# Patient Record
Sex: Male | Born: 1980 | Race: White | Hispanic: No | Marital: Single | Smoking: Current every day smoker
Health system: Southern US, Community
[De-identification: ages and names within clinical notes are randomized; demographics above are authoritative.]

## PROBLEM LIST (undated history)

## (undated) DIAGNOSIS — Z8709 Personal history of other diseases of the respiratory system: Secondary | ICD-10-CM

## (undated) DIAGNOSIS — Z8489 Family history of other specified conditions: Secondary | ICD-10-CM

## (undated) DIAGNOSIS — Z973 Presence of spectacles and contact lenses: Secondary | ICD-10-CM

## (undated) DIAGNOSIS — G63 Polyneuropathy in diseases classified elsewhere: Secondary | ICD-10-CM

## (undated) DIAGNOSIS — F329 Major depressive disorder, single episode, unspecified: Secondary | ICD-10-CM

## (undated) DIAGNOSIS — F410 Panic disorder [episodic paroxysmal anxiety] without agoraphobia: Secondary | ICD-10-CM

## (undated) DIAGNOSIS — E119 Type 2 diabetes mellitus without complications: Secondary | ICD-10-CM

## (undated) DIAGNOSIS — I739 Peripheral vascular disease, unspecified: Secondary | ICD-10-CM

## (undated) DIAGNOSIS — E349 Endocrine disorder, unspecified: Secondary | ICD-10-CM

## (undated) DIAGNOSIS — R351 Nocturia: Secondary | ICD-10-CM

## (undated) DIAGNOSIS — L24A9 Irritant contact dermatitis due friction or contact with other specified body fluids: Secondary | ICD-10-CM

## (undated) DIAGNOSIS — T148XXA Other injury of unspecified body region, initial encounter: Secondary | ICD-10-CM

## (undated) DIAGNOSIS — I10 Essential (primary) hypertension: Secondary | ICD-10-CM

## (undated) DIAGNOSIS — K219 Gastro-esophageal reflux disease without esophagitis: Secondary | ICD-10-CM

## (undated) DIAGNOSIS — D649 Anemia, unspecified: Secondary | ICD-10-CM

## (undated) DIAGNOSIS — Z972 Presence of dental prosthetic device (complete) (partial): Secondary | ICD-10-CM

## (undated) DIAGNOSIS — F32A Depression, unspecified: Secondary | ICD-10-CM

## (undated) DIAGNOSIS — K08109 Complete loss of teeth, unspecified cause, unspecified class: Secondary | ICD-10-CM

## (undated) HISTORY — PX: MULTIPLE TOOTH EXTRACTIONS: SHX2053

## (undated) HISTORY — DX: Peripheral vascular disease, unspecified: I73.9

## (undated) HISTORY — PX: ESOPHAGOGASTRODUODENOSCOPY: SHX1529

## (undated) HISTORY — PX: MOUTH SURGERY: SHX715

---
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
## 1999-07-06 ENCOUNTER — Encounter (HOSPITAL_COMMUNITY): Admission: RE | Admit: 1999-07-06 | Discharge: 1999-10-04 | Payer: Self-pay | Admitting: Dentistry

## 2000-08-08 ENCOUNTER — Emergency Department (HOSPITAL_COMMUNITY): Admission: EM | Admit: 2000-08-08 | Discharge: 2000-08-08 | Payer: Self-pay | Admitting: Emergency Medicine

## 2002-12-11 ENCOUNTER — Encounter: Admission: RE | Admit: 2002-12-11 | Discharge: 2002-12-11 | Payer: Self-pay | Admitting: Dentistry

## 2003-10-13 ENCOUNTER — Inpatient Hospital Stay (HOSPITAL_COMMUNITY): Admission: EM | Admit: 2003-10-13 | Discharge: 2003-10-16 | Payer: Self-pay | Admitting: Emergency Medicine

## 2003-10-15 DIAGNOSIS — K0401 Reversible pulpitis: Secondary | ICD-10-CM

## 2003-10-15 DIAGNOSIS — K045 Chronic apical periodontitis: Secondary | ICD-10-CM

## 2003-10-20 ENCOUNTER — Emergency Department (HOSPITAL_COMMUNITY): Admission: EM | Admit: 2003-10-20 | Discharge: 2003-10-21 | Payer: Self-pay | Admitting: Emergency Medicine

## 2003-12-06 ENCOUNTER — Ambulatory Visit (HOSPITAL_COMMUNITY): Admission: RE | Admit: 2003-12-06 | Discharge: 2003-12-06 | Payer: Self-pay | Admitting: Internal Medicine

## 2003-12-12 ENCOUNTER — Encounter: Payer: Self-pay | Admitting: Internal Medicine

## 2004-08-31 ENCOUNTER — Ambulatory Visit: Payer: Self-pay | Admitting: Psychology

## 2004-09-02 ENCOUNTER — Ambulatory Visit: Payer: Self-pay | Admitting: Internal Medicine

## 2005-08-27 ENCOUNTER — Ambulatory Visit: Payer: Self-pay | Admitting: Endocrinology

## 2006-01-11 ENCOUNTER — Ambulatory Visit: Payer: Self-pay | Admitting: Internal Medicine

## 2006-01-14 ENCOUNTER — Ambulatory Visit: Payer: Self-pay | Admitting: Internal Medicine

## 2006-03-28 ENCOUNTER — Ambulatory Visit: Payer: Self-pay | Admitting: Dentistry

## 2006-05-10 ENCOUNTER — Ambulatory Visit: Payer: Self-pay | Admitting: Dentistry

## 2006-06-02 ENCOUNTER — Ambulatory Visit: Payer: Self-pay | Admitting: Endocrinology

## 2006-06-07 ENCOUNTER — Ambulatory Visit: Payer: Self-pay

## 2006-06-09 ENCOUNTER — Ambulatory Visit (HOSPITAL_COMMUNITY): Admission: RE | Admit: 2006-06-09 | Discharge: 2006-06-09 | Payer: Self-pay | Admitting: Dentistry

## 2006-06-17 ENCOUNTER — Encounter: Payer: Self-pay | Admitting: *Deleted

## 2006-06-18 ENCOUNTER — Inpatient Hospital Stay (HOSPITAL_COMMUNITY): Admission: EM | Admit: 2006-06-18 | Discharge: 2006-06-21 | Payer: Self-pay | Admitting: Emergency Medicine

## 2006-06-18 ENCOUNTER — Ambulatory Visit: Payer: Self-pay | Admitting: Internal Medicine

## 2006-06-28 ENCOUNTER — Ambulatory Visit: Payer: Self-pay | Admitting: Endocrinology

## 2006-07-12 ENCOUNTER — Ambulatory Visit: Payer: Self-pay | Admitting: Internal Medicine

## 2006-07-20 ENCOUNTER — Ambulatory Visit: Payer: Self-pay | Admitting: Endocrinology

## 2006-08-06 ENCOUNTER — Ambulatory Visit: Payer: Self-pay | Admitting: Family Medicine

## 2006-09-14 ENCOUNTER — Ambulatory Visit: Payer: Self-pay | Admitting: Endocrinology

## 2006-09-14 LAB — CONVERTED CEMR LAB: Hgb A1c MFr Bld: 9.5 % — ABNORMAL HIGH (ref 4.6–6.0)

## 2006-09-20 ENCOUNTER — Ambulatory Visit: Payer: Self-pay | Admitting: Endocrinology

## 2006-09-29 ENCOUNTER — Encounter: Payer: Self-pay | Admitting: Endocrinology

## 2006-09-29 DIAGNOSIS — K219 Gastro-esophageal reflux disease without esophagitis: Secondary | ICD-10-CM

## 2006-09-29 DIAGNOSIS — I119 Hypertensive heart disease without heart failure: Secondary | ICD-10-CM

## 2006-09-29 DIAGNOSIS — F411 Generalized anxiety disorder: Secondary | ICD-10-CM | POA: Insufficient documentation

## 2006-11-11 ENCOUNTER — Ambulatory Visit: Payer: Self-pay | Admitting: Endocrinology

## 2006-11-21 ENCOUNTER — Emergency Department (HOSPITAL_COMMUNITY): Admission: EM | Admit: 2006-11-21 | Discharge: 2006-11-21 | Payer: Self-pay | Admitting: Emergency Medicine

## 2006-12-07 ENCOUNTER — Ambulatory Visit: Payer: Self-pay | Admitting: Internal Medicine

## 2006-12-08 ENCOUNTER — Encounter: Payer: Self-pay | Admitting: Internal Medicine

## 2007-01-16 ENCOUNTER — Encounter: Payer: Self-pay | Admitting: Endocrinology

## 2007-01-16 ENCOUNTER — Telehealth: Payer: Self-pay | Admitting: Endocrinology

## 2007-02-01 ENCOUNTER — Ambulatory Visit: Payer: Self-pay | Admitting: Internal Medicine

## 2007-02-01 DIAGNOSIS — F519 Sleep disorder not due to a substance or known physiological condition, unspecified: Secondary | ICD-10-CM | POA: Insufficient documentation

## 2007-02-01 DIAGNOSIS — E109 Type 1 diabetes mellitus without complications: Secondary | ICD-10-CM | POA: Insufficient documentation

## 2007-02-01 DIAGNOSIS — J069 Acute upper respiratory infection, unspecified: Secondary | ICD-10-CM | POA: Insufficient documentation

## 2007-02-02 ENCOUNTER — Encounter: Payer: Self-pay | Admitting: Internal Medicine

## 2007-02-02 LAB — CONVERTED CEMR LAB
AST: 65 units/L — ABNORMAL HIGH (ref 0–37)
Bilirubin, Direct: 0.2 mg/dL (ref 0.0–0.3)
CO2: 34 meq/L — ABNORMAL HIGH (ref 19–32)
Chloride: 97 meq/L (ref 96–112)
Creatinine, Ser: 0.8 mg/dL (ref 0.4–1.5)
Creatinine,U: 87.5 mg/dL
Eosinophils Absolute: 0.1 10*3/uL (ref 0.0–0.6)
Eosinophils Relative: 0.7 % (ref 0.0–5.0)
GFR calc non Af Amer: 123 mL/min
Glucose, Bld: 248 mg/dL — ABNORMAL HIGH (ref 70–99)
HCT: 46.3 % (ref 39.0–52.0)
HCV Ab: NEGATIVE
Hep A IgM: NEGATIVE
Hep B C IgM: NEGATIVE
Ketones, ur: NEGATIVE mg/dL
MCV: 97.2 fL (ref 78.0–100.0)
Neutrophils Relative %: 68.6 % (ref 43.0–77.0)
Nitrite: NEGATIVE
RBC: 4.76 M/uL (ref 4.22–5.81)
Sodium: 138 meq/L (ref 135–145)
Total Bilirubin: 0.7 mg/dL (ref 0.3–1.2)
Total Protein: 6.9 g/dL (ref 6.0–8.3)
Urobilinogen, UA: 1 (ref 0.0–1.0)
WBC: 7.5 10*3/uL (ref 4.5–10.5)

## 2007-04-04 ENCOUNTER — Ambulatory Visit: Payer: Self-pay | Admitting: Endocrinology

## 2007-04-04 LAB — CONVERTED CEMR LAB: Hgb A1c MFr Bld: 9.5 % — ABNORMAL HIGH (ref 4.6–6.0)

## 2007-05-05 ENCOUNTER — Ambulatory Visit: Payer: Self-pay | Admitting: Endocrinology

## 2007-05-12 ENCOUNTER — Telehealth: Payer: Self-pay | Admitting: Endocrinology

## 2007-05-16 ENCOUNTER — Ambulatory Visit: Payer: Self-pay | Admitting: Internal Medicine

## 2007-06-20 ENCOUNTER — Ambulatory Visit: Payer: Self-pay | Admitting: Internal Medicine

## 2007-07-03 ENCOUNTER — Encounter: Payer: Self-pay | Admitting: Endocrinology

## 2007-07-04 ENCOUNTER — Encounter: Payer: Self-pay | Admitting: Endocrinology

## 2007-08-14 ENCOUNTER — Telehealth: Payer: Self-pay | Admitting: Endocrinology

## 2007-08-23 ENCOUNTER — Ambulatory Visit: Payer: Self-pay | Admitting: Endocrinology

## 2007-08-23 ENCOUNTER — Encounter: Payer: Self-pay | Admitting: Endocrinology

## 2007-08-23 DIAGNOSIS — R05 Cough: Secondary | ICD-10-CM

## 2007-08-23 DIAGNOSIS — R059 Cough, unspecified: Secondary | ICD-10-CM | POA: Insufficient documentation

## 2007-08-23 DIAGNOSIS — F17200 Nicotine dependence, unspecified, uncomplicated: Secondary | ICD-10-CM

## 2007-08-28 ENCOUNTER — Ambulatory Visit: Payer: Self-pay | Admitting: Endocrinology

## 2008-01-18 ENCOUNTER — Ambulatory Visit: Payer: Self-pay | Admitting: Internal Medicine

## 2008-01-18 ENCOUNTER — Observation Stay (HOSPITAL_COMMUNITY): Admission: EM | Admit: 2008-01-18 | Discharge: 2008-01-19 | Payer: Self-pay | Admitting: Emergency Medicine

## 2008-02-02 ENCOUNTER — Ambulatory Visit: Payer: Self-pay | Admitting: Endocrinology

## 2008-02-02 DIAGNOSIS — K769 Liver disease, unspecified: Secondary | ICD-10-CM | POA: Insufficient documentation

## 2008-02-02 DIAGNOSIS — D72829 Elevated white blood cell count, unspecified: Secondary | ICD-10-CM | POA: Insufficient documentation

## 2008-02-02 DIAGNOSIS — F1010 Alcohol abuse, uncomplicated: Secondary | ICD-10-CM | POA: Insufficient documentation

## 2008-02-02 LAB — CONVERTED CEMR LAB
AST: 19 units/L (ref 0–37)
Albumin: 4 g/dL (ref 3.5–5.2)
Alkaline Phosphatase: 83 units/L (ref 39–117)
BUN: 6 mg/dL (ref 6–23)
Bilirubin, Direct: 0.1 mg/dL (ref 0.0–0.3)
Chloride: 104 meq/L (ref 96–112)
Eosinophils Relative: 1.2 % (ref 0.0–5.0)
GFR calc non Af Amer: 143 mL/min
Glucose, Bld: 196 mg/dL — ABNORMAL HIGH (ref 70–99)
Monocytes Relative: 7.3 % (ref 3.0–12.0)
Neutrophils Relative %: 61.9 % (ref 43.0–77.0)
Platelets: 206 10*3/uL (ref 150–400)
Potassium: 4.1 meq/L (ref 3.5–5.1)
RDW: 12.8 % (ref 11.5–14.6)
Sodium: 141 meq/L (ref 135–145)
Total Protein: 6.5 g/dL (ref 6.0–8.3)
WBC: 6.5 10*3/uL (ref 4.5–10.5)

## 2008-02-12 ENCOUNTER — Telehealth (INDEPENDENT_AMBULATORY_CARE_PROVIDER_SITE_OTHER): Payer: Self-pay | Admitting: *Deleted

## 2008-03-13 ENCOUNTER — Telehealth (INDEPENDENT_AMBULATORY_CARE_PROVIDER_SITE_OTHER): Payer: Self-pay | Admitting: *Deleted

## 2008-04-16 ENCOUNTER — Ambulatory Visit: Payer: Self-pay | Admitting: Endocrinology

## 2008-06-14 ENCOUNTER — Ambulatory Visit: Payer: Self-pay | Admitting: Endocrinology

## 2009-04-15 ENCOUNTER — Ambulatory Visit: Payer: Self-pay | Admitting: Endocrinology

## 2009-04-15 LAB — CONVERTED CEMR LAB
ALT: 39 units/L (ref 0–53)
AST: 39 units/L — ABNORMAL HIGH (ref 0–37)
Albumin: 4.4 g/dL (ref 3.5–5.2)
Alkaline Phosphatase: 73 units/L (ref 39–117)
Basophils Absolute: 0 10*3/uL (ref 0.0–0.1)
Basophils Relative: 0.3 % (ref 0.0–3.0)
Calcium: 9.4 mg/dL (ref 8.4–10.5)
Creatinine,U: 65.9 mg/dL
Direct LDL: 77.5 mg/dL
Eosinophils Relative: 0.8 % (ref 0.0–5.0)
GFR calc non Af Amer: 140.74 mL/min (ref >60)
Glucose, Bld: 159 mg/dL — ABNORMAL HIGH (ref 70–99)
HCT: 45.3 % (ref 39.0–52.0)
HDL: 136.4 mg/dL (ref >39.00)
Hemoglobin: 15.2 g/dL (ref 13.0–17.0)
Ketones, ur: NEGATIVE mg/dL
Lymphocytes Relative: 18.5 % (ref 12.0–46.0)
Lymphs Abs: 1.5 10*3/uL (ref 0.7–4.0)
Monocytes Relative: 7.9 % (ref 3.0–12.0)
Neutro Abs: 6 10*3/uL (ref 1.4–7.7)
Potassium: 3.9 meq/L (ref 3.5–5.1)
RBC: 4.64 M/uL (ref 4.22–5.81)
RDW: 12.2 % (ref 11.5–14.6)
Sodium: 139 meq/L (ref 135–145)
Specific Gravity, Urine: 1.015 (ref 1.000–1.030)
TSH: 1.41 microintl units/mL (ref 0.35–5.50)
Total CHOL/HDL Ratio: 2
Total Protein: 6.8 g/dL (ref 6.0–8.3)
Triglycerides: 46 mg/dL (ref 0.0–149.0)
Urine Glucose: >=1000 mg/dL
Urobilinogen, UA: 1 (ref 0.0–1.0)
WBC: 8.2 10*3/uL (ref 4.5–10.5)
pH: 6 (ref 5.0–8.0)

## 2009-04-16 IMAGING — CR DG CHEST 2V
2 series · 2 of 2 positions shown · non-contrast
Comparison: Chest x-ray of 08/23/2007

CLINICAL DATA: Short of breath, cough, smoking history

CHEST - 2 VIEW

[view not recorded (1 of 2)]
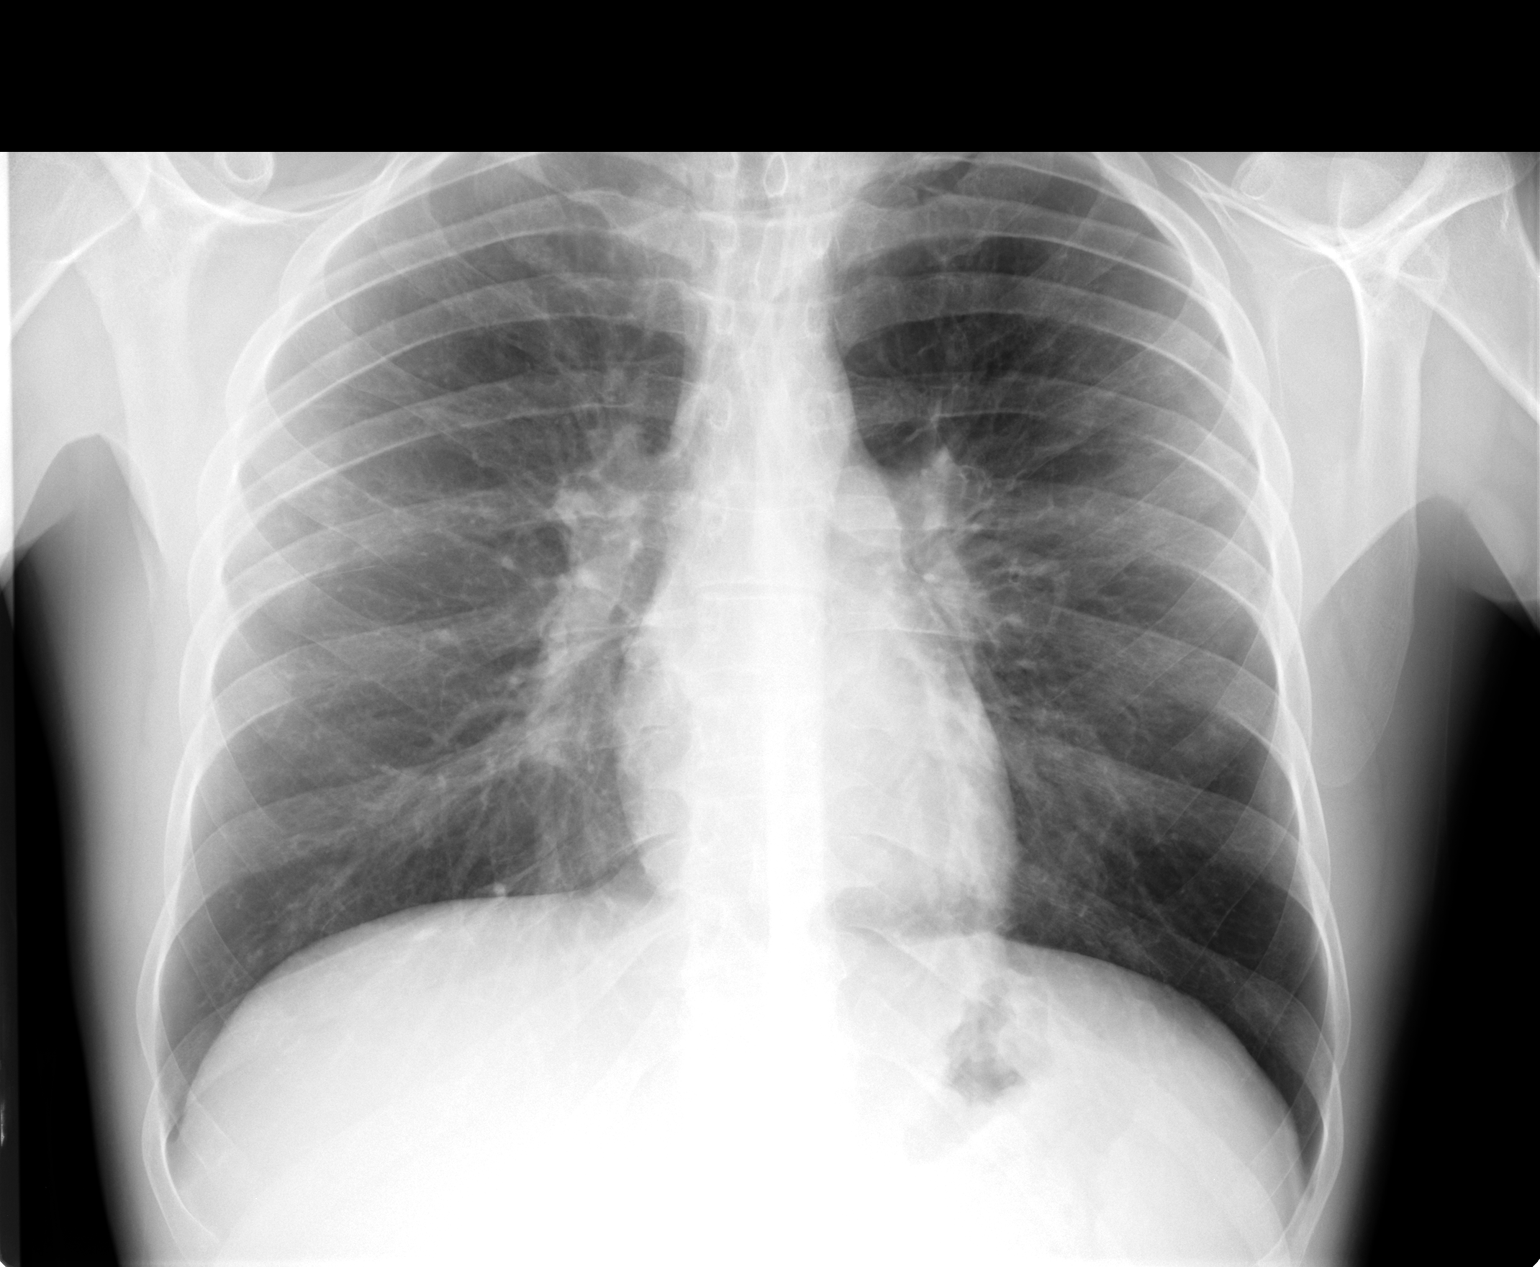

[view not recorded (2 of 2)]
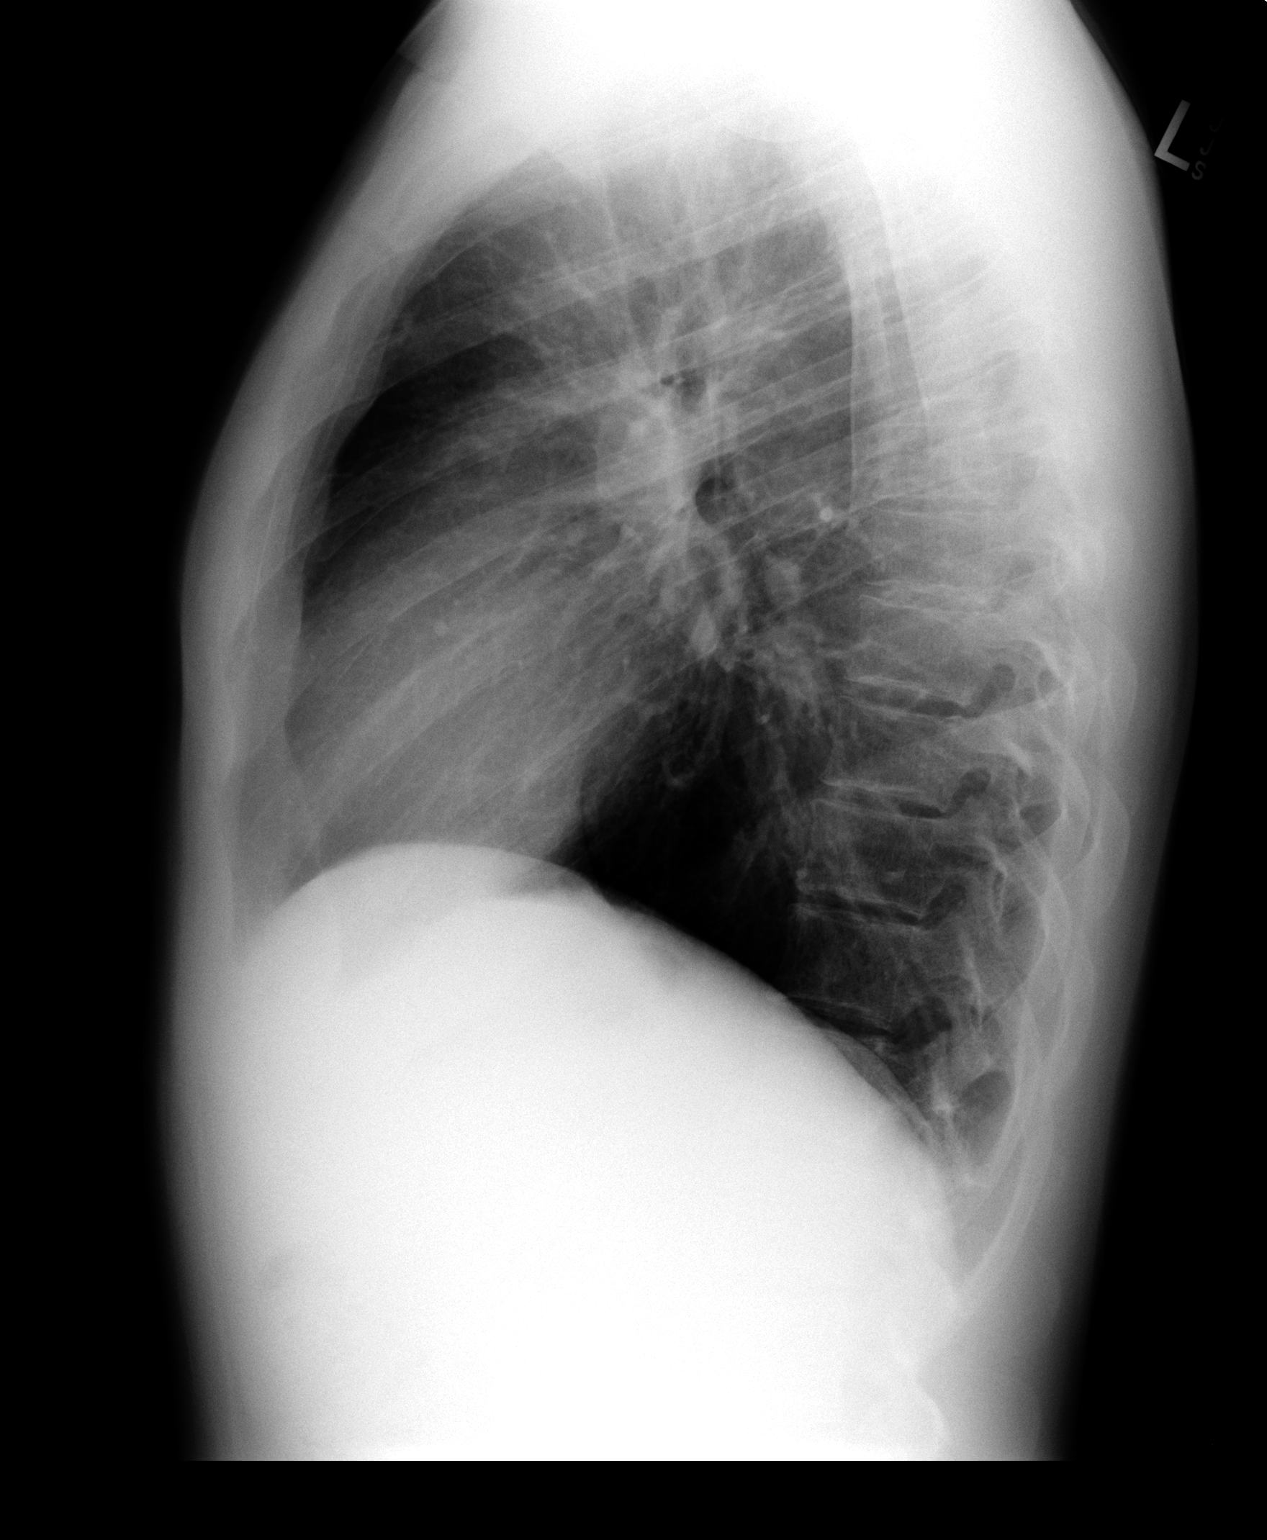

[2 of 2 positions shown; findings below may reference images not displayed]

FINDINGS: The lungs remain clear and slightly hyperaerated.  Heart
is within normal limits in size.  No bony abnormality is seen.
IMPRESSION: Stable chest x-ray with slight hyperaeration.  No active lung
disease.

## 2009-05-16 ENCOUNTER — Ambulatory Visit: Payer: Self-pay | Admitting: Endocrinology

## 2010-03-31 NOTE — Assessment & Plan Note (Signed)
Summary: FU Jay Barber  #   Vital Signs:  Patient profile:   30 year old male Height:      71 inches (180.34 cm) Weight:      148 pounds (67.27 kg) BMI:     20.72 O2 Sat:      97 % on Room air Temp:     98.4 degrees F (36.89 degrees C) oral Pulse rate:   77 / minute BP sitting:   120 / 70  (left arm) Cuff size:   regular  Vitals Entered By: Josph Macho RMA (April 15, 2009 2:25 PM)  O2 Flow:  Room air CC: Follow-up visit/ CF Is Patient Diabetic? Yes   CC:  Follow-up visit/ CF.  History of Present Illness: no cbg record, but states cbg's are low in the middle of the night, approx 1/week.  he says it is "good during the day."  he otherwise feels well. he says he wants to quit smoking. pt c/o skin peeling off his feet, especially the right foot.   Current Medications (verified): 1)  Accu-Chek Aviva  Strp (Glucose Blood) .... Check Blood Sugars Up To 8/day 2)  Bd U/f Iii Short Pen Needle 31g X 8 Mm Misc (Insulin Pen Needle) .... Use As Directed 3)  Zoloft 50 Mg Tabs (Sertraline Hcl) .... Take 1 By Mouth Qd 4)  Novolog Mix 70/30 Flexpen 70-30 % Susp (Insulin Aspart Prot & Aspart) .Marland Kitchen.. 16 Units Qam    9 Units Qpm 5)  Xanax .... 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    Physical Exam  General:  normal appearance.   Pulses:  dorsalis pedis intact bilat.  Extremities:  no deformity.  no ulcer on the feet.  feet are of normal color and temp.  no edema  Neurologic:  sensation is intact to  touch on the feet.  Additional Exam:  Hemoglobin A1C       [H]  9.6 %    Impression & Recommendations:  Problem # 1:  DIABETES MELLITUS, TYPE I (ICD-250.01) therapy limited by noncompliance.  i'll do the best i can.  Problem # 2:  SMOKER (ICD-305.1) wants to quit  Problem # 3:  tinea pedis recurrent  Medications Added to Medication List This Visit: 1)  Novolog Mix 70/30 Flexpen 70-30 % Susp (Insulin aspart prot & aspart) .Marland Kitchen.. 16 units qam    8 units qpm 2)  Chantix Starting Month Pak 0.5 Mg X 11 & 1 Mg X 42 Tabs (Varenicline tartrate) .... As directed.  please refill with continuing packs.  Other Orders: TLB-Lipid Panel (80061-LIPID) TLB-BMP (Basic Metabolic Panel-BMET) (80048-METABOL) TLB-CBC Platelet - w/Differential (85025-CBCD) TLB-Hepatic/Liver Function Pnl (80076-HEPATIC) TLB-TSH (Thyroid Stimulating Hormone) (84443-TSH) TLB-A1C / Hgb A1C (Glycohemoglobin) (83036-A1C) TLB-Microalbumin/Creat Ratio, Urine (82043-MALB) TLB-Udip w/ Micro (81001-URINE) Est. Patient Level IV (16109)  Patient Instructions: 1)  decrease novolog 70/30 to 16 units am and 8 units pm 2)  Please schedule a follow-up appointment in 1 month. 3)  check your blood glucose 4 times a day.  vary the time of day between before the 3 meals and at bedtime.  also check if you feel as though  your glucose might be very high or too low.  bring a record of this to your doctor appointments. 4)  i sent a precsription for "chantix" to your pharmacy.  joining a "quit smoking" program significanlty increases your chances of successfully quitting, and may be required for the insurance to pay for the medication.  a side effect of this medication is a possible worsening of depression.  call if this happens. 5)  apply clotrimazole cream (non-prescription) to the feet two times a day. 6)  tests are being ordered for you today.  a few days after the test(s), please call [phone] to hear your test results. 7)  (update: i left  message on phone-tree:  rx as we discussed) Prescriptions: ACCU-CHEK AVIVA  STRP (GLUCOSE BLOOD) CHECK BLOOD SUGARS UP TO 8/day  #250 x 11   Entered and Authorized by:   Minus Breeding MD   Signed by:   Minus Breeding MD on 04/15/2009   Method used:   Electronically to        [facility]  [address] [address] [phone][facility] (retail)       [address]       [place], [place]  [address]       Ph: [phone] or [phone]       Fax: [phone]   RxID:   [id number] BD U/F III SHORT PEN NEEDLE 31G X 8 MM MISC (INSULIN PEN NEEDLE) use as directed  #120 x 6   Entered and Authorized by:   Minus Breeding MD   Signed by:   Minus Breeding MD on 04/15/2009   Method used:   Electronically to        [facility]  [address] [phone][facility] (retail)       [address]       [place], [place]  [address]       Ph: [phone] or [phone]       Fax: [phone]   RxID:   [id number] NOVOLOG MIX 70/30 FLEXPEN 70-30 % SUSP (INSULIN ASPART PROT & ASPART) 16 units qam    8 units qpm  #1 box x 5   Entered and Authorized by:   Minus Breeding MD   Signed by:   Minus Breeding MD on 04/15/2009   Method used:   Electronically to        [facility]  [address] [phone][facility] (retail)       [address]       [place], [place]  [address]       Ph: [phone] or [phone]       Fax: [phone]   RxID:   [id number] CHANTIX STARTING MONTH PAK 0.5 MG X 11 & 1 MG X 42 TABS (VARENICLINE TARTRATE) as directed.  please refill with continuing packs.  #1 pack x 5   Entered and Authorized by:   Minus Breeding MD   Signed by:   Minus Breeding MD on 04/15/2009   Method used:   Electronically to        [facility]  [address] [phone]* (retail)       [address]       [place], [place]  [address]       Ph: [phone] or [phone]  Fax: [phone]   RxID:   [id number]

## 2010-03-31 NOTE — Letter (Signed)
Summary: Generic Letter  [facility]  [address] [place], [place] [address]   Phone: [phone]  Fax: [phone]    04/15/2009  Jay Barber [address] [place], [place]  [address]  Dear [name],  This is to certify that we are taking measures to prevent your blood sugar from going low.  However, this could still happen, causing you to miss work.     Sincerely,   Romero Belling MD

## 2010-07-14 NOTE — Assessment & Plan Note (Signed)
[facility]                         GASTROENTEROLOGY OFFICE NOTE   NAME:JOHNSONLondon, Tarnowski                      MRN:          [id number]  DATE:12/07/2006                            DOB:          Oct 31, 1980    CHIEF COMPLAINT:  Diarrhea, abdominal pain.   HISTORY:  This is a 30 year old white male I had seen in the past with  vomiting and mild erosive esophagitis.  He is now having 1-2 months of  postprandial watery diarrhea.  Associated with come crampy abdominal  pain.  No fever or chills or rash.  He has had no recent antibiotics.  No weight loss.  He is moving his bowel 10 minutes to an hour after he  eats.  There are no nocturnal symptoms.  He has been moving through some  different SSRIs recently as well.  Anxiety seems to be under better  control.  Respiratory and constitutional review of systems otherwise  under control.   PAST MEDICAL HISTORY:  1. Diabetes mellitus.  2. Anxiety.  3. Gastroesophageal reflux disease.  He has recently tried some      Prilosec because Nexium was expensive.  He is having some chest      pain problems which are fleeting, pins and needles pains in the      upper chest.  A stress test was negative he tells me and he took      Prilosec for a month and that did not seem to change that.  He is      now off a PPI.  4. Smoking.  5. Hypertension.  6. History of diabetic ketoacidosis.   MEDICATIONS:  1. Lantus.  2. NovoLog.  3. Zoloft 100 mg daily.  4. Xanax 0.25 mg p.r.n. and 1 mg p.r.n.   DRUG ALLERGIES:  NONE KNOWN.   REVIEW OF SYSTEMS:  As above.   Family history, social history reviewed and unchanged.  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   PHYSICAL:  Weight 153 pounds, pulse 68, blood pressure 178/72.  EYES:  Anicteric.  NECK:  Supple.  CHEST:  Clear.  HEART:  S1-S2, no rubs or gallops.  ABDOMEN:  Soft, nontender, no organomegaly or mass.  LOWER EXTREMITIES:  Free of edema.  SKIN:  Warm and  dry without acute rash in the upper trunk.  MOUTH:  Posterior pharynx free of lesions, he does wear dentures.  He is alert and oriented x3.  He does not appear overtly anxious today.   ASSESSMENT:  Diarrhea, unclear etiology.  Certainly could be a  functional syndrome.  Must rule out infection first.   PLAN:  1. Stool studies for fecal lactoferrin, culture, C. diff toxin and ova      and parasite screening.  2. In the interim 1-2 Imodium a day.  3. If the stool studies are all negative I think I would take a      approach toward a functional therapy.  He has noted some benefit      with Pepto Bismol.  Empiric antibiotics would be reasonable in a      diabetic  as well, something like Cipro I think would make sense for      a cost effectiveness, but Xifaxan could be used as well.  So, if      his stool studies are all negative I would take an approach like      that and then see the result and then see him back.  Will notify      him with the results.     Iva Boop, MD,FACG  Electronically Signed    CEG/MedQ  DD: 12/07/2006  DT: 12/08/2006  Job #: [id number]

## 2010-07-14 NOTE — Assessment & Plan Note (Signed)
[facility]                                 ON-CALL NOTE   Jay Barber, Jay Barber                        MRN:          [id number]  DATE:08/03/2006                            DOB:          [birth year]    Phone number:  [phone].  Patient of Dr. Everardo All.  He calls at about  7:16 p.m. on June 4th.   He calls because he does not feel well.  He is having cold sweats,  shaking and his blood pressure is high; he measured it with a machine at  170/108.  He said he feels like the kind of sensation you get with a low  sugar reaction and face flushing, but his sugars have been okay.  He has  had diabetes for 8 years, treated with Lantus and NovoLog.  He recently  went off Lexapro in the past week or so, but has not had any symptoms  until the last day or so.  He does not feel sick like with a fever.  He  is getting over asthmatic bronchitis, but feels like he is over that.  No breathing problems, abdominal problems or other infectious symptoms.   PLAN:  I told him this could be related to his blood pressure, but it is  also possible it is a withdrawal effect from the Lexapro.  I asked him  to call Dr. Everardo All for evaluation tomorrow if he is not feeling any  better.  If he was to have chest pain or significant shortness of breath  I asked him to call 911 so that he could emergency evaluation tonight.     Karie Schwalbe, MD  Electronically Signed    RIL/MedQ  DD: 08/03/2006  DT: 08/04/2006  Job #: [id number]   cc:   Gregary Signs A. Everardo All, MD

## 2010-07-14 NOTE — Discharge Summary (Signed)
Jay Barber, Jay Barber NO.:  [id number]   MEDICAL RECORD NO.:  [id number]          PATIENT TYPE:  INP   LOCATION:  1222                         FACILITY:  [facility]   PHYSICIAN:  Corwin Levins, MD      DATE OF BIRTH:  12-12-1980   DATE OF ADMISSION:  01/17/2008  DATE OF DISCHARGE:  01/19/2008                               DISCHARGE SUMMARY   PRIMARY CARE PHYSICIAN:  Dr. Romero Belling.   DISCHARGE DIAGNOSES:  1. Anion gap.  2. Diabetic ketoacidosis, now resolved.   HISTORY OF PRESENT ILLNESS:  Jay Barber is a 30 year old white male  with past medical history of type 1 diabetes who reported to [facility]  Emergency Room on day of admission with reports of feeling ill with  elevated blood sugar.  Patient reported nausea, vomiting, and abdominal  pain starting just prior to arrival in emergency room.  Upon initial  evaluation, patient found to have a bicarb of 7 with blood sugar 454.  Patient was admitted at that time for further evaluation and treatment.  Also of note, per admission H and P, patient denied any recent ETOH use.  However, upon further discussion with patient, he admits to drinking  approximately 8 beers on evening prior to this admission likely  initiating uncontrolled blood sugars.   PAST MEDICAL HISTORY:  1. Type 1 diabetes.  2. Current tobacco abuse.   COURSE OF HOSPITALIZATION:  Diabetic ketoacidosis.  Patient admitted to  the emergency room to the intensive care unit, placed on IV insulin drip  with close monitoring of BMETs.  Patient responded very well to IV  fluids and IV insulin drip with bicarb up to 23 at time of dictation.  Again, patient's onset of DKA likely secondary to ETOH use prior to this  admission.  Patient reports compliance with medications.  Counseled  patient at length in regard to adverse effects of drinking ETOH in  setting of type 1 diabetes.  Patient verified understanding.  A1c not  obtained during this admission,  however, would recommend rechecking at  time of followup appointment.  Last A1c documented at 9.5 in February  2009.  Patient without any signs or symptoms of infection with negative  chest x-ray and negative urinalysis.   MEDICATIONS AT TIME OF DISCHARGE:  1. Lantus 15 units subcu q.h.s.  2. NovoLog insulin 4 units q.a.c.  3. Zoloft 50 mg p.o. daily.  4. Xanax 1 mg b.i.d. p.r.n. anxiety.   PERTINENT LAB WORK AT TIME OF DISCHARGE:  Sodium 139, potassium 3.5,  chloride 112, CO2 of 23, BUN 7, creatinine 0.71.   DISPOSITION:  Patient felt medically stable for discharge home at this  time.  Patient is instructed to follow up with his primary care  physician, Dr. Romero Belling, on Friday February 02, 2008, at 10:30 a.m.  to determine need for any titration of insulin regimen and check A1c.      Cordelia Pen, NP      Corwin Levins, MD  Electronically Signed    LE/MEDQ  D:  01/19/2008  T:  01/19/2008  Job:  [id number]   cc:   Gregary Signs A. Everardo All, MD  [address]  [place]  [place] [address]

## 2010-07-14 NOTE — H&P (Signed)
NAMEKIPPER, BUCH NO.:  [id number]   MEDICAL RECORD NO.:  [id number]          PATIENT TYPE:  EMS   LOCATION:  ED                           FACILITY[facility]  [facility]   PHYSICIAN:  Michiel Cowboy, MDDATE OF BIRTH:  1980/03/11   DATE OF ADMISSION:  01/17/2008  DATE OF DISCHARGE:                              HISTORY & PHYSICAL   PRIMARY CARE Elyzabeth Goatley:  Dr. Everardo All of [place].   CHIEF COMPLAINT:  Nausea, vomiting.   Patient is a pleasant 30 year old gentleman with a history of type 1  diabetes, followed by Dr. Everardo All, who for the past 24 hours had not  been feeling well.  He woke up with somewhat elevated blood sugar.  He  had a poor appetite, all day long did not eat anything, but his blood  sugar continued to increase.  He developed nausea, vomiting, and  abdominal pain.  He presented to the nurse at the place of work.  Told  her he suspects he has DKA and was referred to come to the emergency  department, where he was found to possibly have DKA with a bicarb of 7.  Blood sugar initially was 454, now down to 202 after getting some IV  fluids.  His VBG showed a pH of 6.99.   Patient is currently feeling a little bit better, although still a  little bit nauseous, otherwise review of systems, no chest pain, no  shortness of breath, no fevers, no chills.  Otherwise review of systems  unremarkable.   PAST MEDICAL HISTORY:  Diabetes type 1.   SOCIAL HISTORY:  Patient smokes 2 packs per day.  He is a former drinker  but has not drank recently.  There is no history of withdrawal.  When he  used to drink, he said it was just a couple of drinks a week or so.  He  denies drug abuse.   FAMILY HISTORY:  Noncontributory.   HOME MEDICATIONS:  1. Lantus 15 mg nightly.  2. NovoLog 4 units with meals.  3. Zoloft 50 mg daily.  4. Xanax 1-2 times per week, dose unknown.   PHYSICAL EXAMINATION:  VITAL SIGNS:  Temperature 97.2, blood pressure  145/59, pulse 109,  respirations 20, satting 99%.  Patient appears to be currently in no acute distress.  Talkative, lying  down in bed.  Head nontraumatic.  Somewhat dry mucous membranes.  Decreased skin  turgor.  LUNGS:  Clear to auscultation bilaterally.  HEART:  Rapid but regular.  No murmurs, rubs or gallops.  ABDOMEN:  Soft, nontender, nondistended.  LOWER EXTREMITIES:  Without clubbing, cyanosis or edema.  Cranial nerves  are intact.   LABS:  White blood cell count 20.8, hemoglobin 17.3.  Sodium 136,  potassium 5.6, bicarb 7, BUN 17, creatinine 1.77, total bili 2.5,  indirect 2.3.  LFTs are otherwise within normal limits.  Lipase 15.  VBG  showing pH of 6.99, pCO2 36, pO2 42.5.  Glucose initially was 454, now  down to 202.   Chest x-ray showing no abnormalities.   ASSESSMENT/PLAN:  1. This is a gentleman with type 1  diabetes, now most likely in      diabetic ketoacidosis.  Will obtain a UA to confirm.  Will admit to      ICU, given significant acidemia.  Switch to D5-1/2 normal saline if      patient's blood sugar below 250.  Check orthostatics.  For causes      of diabetic ketoacidosis, at this point not clear, given level of      white blood cell count.  Will obtain urine and urine culture.      Other possibilities:  Will cycle cardiac enzymes, although less      likely, given the patient is relatively young.  Chest x-ray was      clear.  Will make sure patient is on Glucommander.  Follow DKA      protocol.  2. History of depression:  Continue Zoloft.  3. Prophylaxis:  Protonix plus Lovenox.  4. Dehydration with elevated creatinine:  Will give IV fluids.   Dr. Felicity Coyer will assume care in the a.m.      Michiel Cowboy, MD  Electronically Signed     AVD/MEDQ  D:  01/18/2008  T:  01/18/2008  Job:  [id number]   cc:   Gregary Signs A. Everardo All, MD  [address]  [place]  [place] [address]

## 2010-07-14 NOTE — Assessment & Plan Note (Signed)
[facility]                                 ON-CALL NOTE   NAME:[name], Jay                      MRN:          [id number]  DATE:07/23/2006                            DOB:          10-02-1980    PHONE NUMBER:  [phone]   PRIMARY CARE PHYSICIAN:  Sean A. Everardo All, MD   HISTORY OF PRESENT ILLNESS:  Called at 4:13 p.m. on May 24.  Jay Barber  had low sugar reaction this morning.  Took his usual 20 of Lantus last  night.  Did a lot of work in the yard but his sugar was down to 43.  He  drank some [facility], and it came up a little bit but then went back  down again.  Needed more [facility], and then he finally was able to  get something to eat.  Again, it went down.  An ambulance was called,  but he was alert and oriented.  The guy just told him to take some  crackers and coke.  After that, his sugar has gone up to 513, and then  again down to 470 recently.  He asked for advice.  He normally takes  Humalog coverage, I guess before meals depending on his sugar.   PLAN:  I told him to take a low dose of Humalog now, just 5 units, that  is what he usually takes if he is around 200, so that he does not have  hypoglycemic reaction again.  I also asked him if he is below 200 again  this evening, to go ahead and take only 15 units of the Lantus.  He is  probably going to need to check in with Dr. Everardo All next week if he  continues to have fluctuations in his blood sugar and especially if he  is hypoglycemic in the morning before taking any Humalog.     Jay Schwalbe, MD  Electronically Signed    RIL/MedQ  DD: 07/23/2006  DT: 07/23/2006  Job #: [id number]   cc:   Gregary Signs A. Everardo All, MD

## 2010-07-17 NOTE — Op Note (Signed)
NAME:  Jay Barber, Jay Barber NO.:  [id number]   MEDICAL RECORD NO.:  [id number]          PATIENT TYPE:  AMB   LOCATION:  DAY                          FACILITY:  [facility]   PHYSICIAN:  Charlynne Pander, D.D.S.DATE OF BIRTH:  06/29/1980   DATE OF PROCEDURE:  06/09/2006  DATE OF DISCHARGE:                               OPERATIVE REPORT   PREOPERATIVE DIAGNOSES:  1. Diabetes mellitus.  2. History of diabetic ketoacidosis.  3. History of acute pulpitis.  4. Chronic apical periodontitis.  5. Chronic periodontitis.  6. Multiple retained root segments.  7. Rampant dental caries.  8. Impacted #32.   POSTOPERATIVE DIAGNOSES:  1. Diabetes mellitus.  2. History of diabetic ketoacidosis.  3. History of acute pulpitis.  4. Chronic apical periodontitis.  5. Chronic periodontitis.  6. Multiple retained root segments.  7. Rampant dental caries.  8. Impacted #32.   OPERATIONS:  1. Extraction of tooth numbers 1, 2, 3, 4, 5, 6, 7, 8, 9, 10, 11, 23,      24, 25, 26, 28, 29, 30, 31 and 32.  2. Two quadrants of alveoloplasty.  3. Insertion of immediate upper complete and lower complete dentures.   SURGEON:  Charlynne Pander, D.D.S.   ASSISTANT:  Elliot Dally (Sales executive).   ANESTHESIA:  General anesthesia via nasoendotracheal tube.   MEDICATIONS:  1. Ancef 1 g IV prior to invasive dental procedures.  2. Local anesthesia with a total utilization of 4 carpules each      containing 36 mg of Xylocaine with 0.018 mg of epinephrine as well      as 3 carpules each containing 9 mg of bupivacaine with 0.009 mg of      epinephrine.   SPECIMENS:  There were 20 teeth, which were discarded.   DRAINS/CULTURES:  None.   COMPLICATIONS:  None.   FLUIDS:  1500 mL lactated Ringer solution.   ESTIMATED BLOOD LOSS:  50 mL.   INDICATIONS:  The patient is a diabetic with a history of diabetic  ketoacidosis.  Patient with a history of acute pulpitis symptoms.  The  patient was  examined and treatment planned for extraction of multiple  teeth with alveoplasty and preprosthetic surgery as indicated along with  insertion of an upper and lower complete denture.  This treatment plan  was formulated to decrease the risk and complications associated with  dental infection from an affecting the patient's systemic health as well  as to assist in glycemic control.   OPERATIVE FINDINGS:  The patient was examined in operating room #6.  The  teeth were identified for extraction.  The patient was noted be affected  by a history of acute pulpitis symptoms, chronic periodontitis, rampant  dental caries, chronic apical periodontitis, and multiple retained root  segments.  The aforementioned necessitated removal of multiple teeth  with alveoloplasty and preprosthetic surgery as well as insertion of  upper and lower complete dentures.   DESCRIPTION OF PROCEDURE:  The patient was brought to the main operating  room #6.  The patient was then placed in supine position on the  operating room  table.  General anesthesia was induced per the Anesthesia  Team.  A time-out was then performed and the patient was identified and  procedures verified.  The patient was then prepped and draped in the  usual manner for a dental medicine procedure.  A throat pack was placed  at this time.  The oral cavity was thoroughly examined with findings as  noted above.  The patient was then ready for the oral surgical  procedures as follows:   Local anesthesia was administered sequentially over the 2-hour long  procedure with a total utilization of 4 carpules each containing 36 mg  Xylocaine with 0.018 mg of epinephrine as well as 3 carpules each  containing 9 mg of bupivacaine with 0.009 mg of epinephrine..   The maxillary right and anterior areas were first approached.  Anesthesia was delivered as previously described.  A 15 blade incision  was made from the distal of #16 and extended to the distal of  #12.  A  surgical flap was then carefully reflected.  The remaining teeth were  then subluxated with a series of straight elevators.  Tooth numbers 1,  2, 3, 4, 5, 6, 7, 8, 9, 10 and 11 were then removed with a 150 forceps  without complications.  Alveoloplasty was then performed utilizing a  rongeur and bone file.  At this point time a distal wedge procedure was  performed on the maxillary right tuberosity.  A tuberosity reduction was  then achieved utilizing a 15 blade and soft tissue pickups.  The tissues  were then approximated and trimmed appropriately.  The surgical site was  then irrigated with copious amounts of sterile saline.  Tissues were  approximated and trimmed again.  At this point in time the surgical  stent was utilized to ensure that adequate alveoloplasty had been  performed.  This appeared to be adequate.  The surgical site was then  irrigated with copious amounts of sterile saline.  The surgical site was  then closed from the maxillary right tuberosity and extended to the  mesial of #8 utilizing 3-0 chromic gut suture in a continuous  interrupted suture technique x1.  The maxillary left surgical site was  then closed from the distal of #12 and extended to the mesial of #9  utilizing 3-0 chromic gut suture in a continuous interrupted suture  technique x1.  At this point in time the upper immediate denture was  inserted without complications.   At this point in time the mandibular quadrants were approached.  The  patient was given bilateral inferior alveolar nerve blocks utilizing the  bupivacaine with epinephrine.  Further infiltration was achieved  utilizing the bupivacaine with epinephrine.  A 15 blade incision was  then made from the distal of #23 and extended to the distal of #26 and  then extended from the distal of #27 to the distal of #32.  A surgical flap was then carefully reflected.  Tooth numbers 23, 24, 25 and 26 were  then removed with a 151 forceps  without complications.  Alveoplasty then  performed utilizing a rongeur and bone file.  The surgical site was then  irrigated with copious amounts of sterile saline.  The surgical site was  then closed from the mesial of #22 and extended to the mesial of #27  utilizing 3-0 chromic gut suture in a continuous interrupted suture  technique x1.  At this point in time tooth numbers 28, 29, 30 and 31  were subluxated with a series  of straight elevators.  Tooth numbers 30  and 31 were removed with a 23 forceps without complications.  Tooth  numbers 28 and 29 were then removed with a 151 forceps without  complications.  At this point in time the flap was further reflected to  expose the impacted tooth #32.  A surgical handpiece and bur and copious  amounts of sterile saline were utilized to remove buccal and interseptal  bone around the coronal aspect of tooth #32.  At this point time a 151  forceps was utilized to remove the tooth without complications.  Alveoloplasty was then performed utilizing a rongeur and bone file.  The  surgical site was then irrigated with copious amounts of sterile saline.  The tissues were approximated and trimmed appropriately.  The surgical  site was then again irrigated with copious amounts of sterile saline.  The surgical site was then closed from distal of #32 and extended to the  distal of #27 utilizing 3-0 chromic gut suture in a continuous  interrupted suture technique x1.  At this point in time the surgical  stent was placed over the tissues and no further reduction would need to  be achieved.  The lower complete over-denture was then inserted after  some adjustments appropriately.  The occlusion appeared to be acceptable  and will be refined tomorrow as a postop visit in an outpatient setting.  At this point in time the entire mouth was irrigated with copious  amounts of sterile saline.  The patient was examined for complications,  seeing none, the dental  medicine procedure was deemed to be complete.  The throat pack was removed at this time.  The patient was then handed  over to the anesthesia team for final disposition.  After the  appropriate amount of time the patient was extubated and taken to the  post anesthesia care unit with stable vital signs and a good oxygenation  level.  All counts were correct for the dental medicine procedure.  The  patient will be given appropriate pain medication and will be seen  tomorrow for evaluation of the upper and lower immediate complete  dentures.      Charlynne Pander, D.D.S.  Electronically Signed     RFK/MEDQ  D:  06/09/2006  T:  06/09/2006  Job:  [id number]   cc:   Gregary Signs A. Everardo All, MD  [address]  [place]  [place] [address]   Tera Mater. Clent Ridges, MD  [address] [place]  [place] [address]

## 2010-07-17 NOTE — Consult Note (Signed)
NAME:  Jay Barber, KEYWORTH NO.:  [id number]   MEDICAL RECORD NO.:  [id number]                   PATIENT TYPE:  INP   LOCATION:  0157                                 FACILITY:  [facility]   PHYSICIAN:  Charlynne Pander, D.D.S.          DATE OF BIRTH:  03-13-1980   DATE OF CONSULTATION:  10/14/2003  DATE OF DISCHARGE:                                   CONSULTATION   Jay Barber is a 30 year old male referred to dental medicine by Dr.  Rene Paci. Patient with a history of diabetic ketoacidosis and reason  for this admission. Patient also with a history of dental pain associated  with left lower quadrant which may have precipitated the episode of poor  glucose control. Dental consultation has been requested to rule out dental  infection and to provide treatment as indicated. The patient was previously  known to dental medicine after initial evaluation on December 11, 2002. The  patient had not followed up with recommended treatment plan for multiple  extractions with alveoloplasty and subsequent insertion of upper and lower  immediate complete dentures.   PAST MEDICAL HISTORY:  1. Diabetes mellitus.     A. History of diabetic ketoacidosis and reason for this admission.  2. Visual deficit--wears glasses/contacts.   ALLERGIES/ADVERSE DRUG REACTIONS:  None known.   MEDICATIONS:  1. Novolog insulin per sliding scale.  2. Nicotine patch daily.  3. Protonix 40 mg IV q.24h.  4. Zosyn 3.375 gm IV q.6h.  5. Potassium chloride 40 mEq as indicated.  6. Morphine sulfate per sliding scale q.2h. as needed.   DENTAL HISTORY:   CHIEF COMPLAINT:  Dental consultation requested to rule out dental infection  which may affects the patient's systemic health.   HISTORY OF PRESENT ILLNESS:  Patient with a history of recent tooth pain and  was prescribed amoxicillin 500 mg q.8h. and Lortab 5/500 for pain on October 11, 2003. The patient is to follow up for dental  medicine for subsequent  evaluation and treatment as indicated. The patient had been previously  evaluated in October 2004 where multiple extractions with alveoloplasty and  subsequent insertion of upper and lower complete dentures were discussed.  The patient had been in the process of following up with root canal  therapies of tooth #22 and #27, but to date has only achieved root canal  therapy associated with tooth #22. The patient subsequently had worsening of  tooth pain which prevented him from taking insulin. This subsequently  resulted in a need to go to the emergency room and was eventually diagnosed  with diabetic ketoacidosis. The patient has been treated for electrolyte  imbalance and diabetic ketoacidosis which is currently resolving under Dr.  Felicity Coyer and Dr. Darryll Capers' care.   The patient gives a history of left lower and possibly upper left quadrant  pain over the past one to two weeks. This pain is very sharp in nature  and  is 10/10 intensity. The patient currently experiencing 3/10 intensity after  antibiotic therapy and pain medication. The patient had extensive discussion  of the risks, benefits, and complications of various treatment options  similar to what was previously discussed. The patient, however, currently  wishes to proceed with extraction of the upper left and lower left quadrant  posterior teeth at this time. This entails teeth numbers 12, 13, 14, 15, 16,  17, 18, 19, 20, and 21 with alveoloplasty and other preprosthetic surgery as  indicated in this area. This will be achieved in the operating room on  October 15, 2003, if the electrolyte imbalance can be achieved along with  surgical clearance by Dr. Felicity Coyer.   DENTAL EXAMINATION:  GENERAL: The patient is a well-developed, well-  nourished male in no acute distress at this time.  EXTRAORAL EXAM:  No significant palpable lymphadenopathy. No obvious  extraoral swelling or mandibular or maxillary  swellings noted.  CENTRAL ORAL EXAM: No significant abscess formation noted at this time. The  patient however does have significant chronic apical periodontitis which may  be resulting in the acute pain that the patient is experiencing. This has  been well documented previously and the patient has been well aware of this  condition over the past year.  ENDODONTIC: Patient with a history of acute irreversible pulpitis symptoms.  Patient with multiple areas of chronic apical periodontitis.  MISSING TEETH: Patient with several missing teeth at this time.  DENTAL CARIES: The patient has rampant dental caries affecting the remaining  teeth and previous restorations.  PROSTHODONTICS: Patient with anticipated extraction of multiple teeth with  the exception of teeth numbers 22 and 27. These teeth will be retained in  the mouth after root canal therapy for possible use with attachments with  the lower complete over denture.  OCCLUSION:  Patient with a poor occlusal scheme at this time.   ASSESSMENT:  1. Acute irreversible pulpitis symptoms associated with multiple areas of     specifically the upper left and lower left quadrants at this time.  2. History of well documented chronic apical periodontitis affecting     multiple teeth.  3. No significant intraoral or extraoral swelling. The patient denies     symptoms of trismus or dysphagia at this time.  4. Rampant dental caries.  5. Poor occlusal scheme.  6. History of oral neglect.  7. Bilateral mandibular tori.  8. Need for preprosthetic surgery prior to subsequent or future upper and     lower complete dentures.   PLAN/RECOMMENDATIONS:  1. Discussion of risks, benefits, and complications of various treatment     options with the patient in relationship to his medical and dental     condition. We discussed various treatment options to include total or    subtotal extractions with alveoloplasty, preprosthetic surgery as     indicated,  alveoloplasty as indicated, selective extractions of the upper     and lower left posterior teeth at this time, periodontal therapy, and     future fabrication of upper and lower complete immediate dentures with or     without attachments as indicated. The patient currently wishes to proceed     with extraction of  teeth numbers 12, 13, 14, 15, 16, 17,  18, 19, 20,     and 21 at this time with alveoloplasty and preprosthetic surgery as     indicated. This will be in the operating room, as can be scheduled most  likely October 15, 2003, at 9:00 a.m. This is dependent upon the fact that     the patient gets his electrolyte imbalance taken care and the patient is     cleared for surgery by Dr. Felicity Coyer.  2. Discussion of findings with Dr. Felicity Coyer as indicated.  3. Scheduled operating room procedure for October 15, 2003, at 09:00 in the     morning. This will be with general     anesthesia with a nasoendotracheal tube by patient desire.  4. The patient will be assisted in coordination of future root canal     therapy, periodontal therapy, and multiple extractions with alveoloplasty     in the operating room in the future.                                               Charlynne Pander, D.D.S.    RFK/MEDQ  D:  10/14/2003  T:  10/14/2003  Job:  [id number]   cc:   Rene Paci, M.D. [facility]  [address] [place], [place] [address]

## 2010-07-17 NOTE — Op Note (Signed)
NAME:  Jay Barber, BATTEN NO.:  [id number]   MEDICAL RECORD NO.:  [id number]                   PATIENT TYPE:  INP   LOCATION:  0349                                 FACILITY:  [facility]   PHYSICIAN:  Charlynne Pander, D.D.S.          DATE OF BIRTH:  1980/06/24   DATE OF PROCEDURE:  10/15/2003  DATE OF DISCHARGE:                                 OPERATIVE REPORT   PREOPERATIVE DIAGNOSES:  1. Diabetes mellitus.  2. History of diabetic ketoacidosis.  3. History of acute pulpitis.  4. Chronic apical periodontitis.   POSTOPERATIVE DIAGNOSES:  1. Diabetes mellitus.  2. History of diabetic ketoacidosis.  3. History of acute pulpitis.  4. Chronic apical periodontitis.  5. Periapical abscess.   OPERATIONS:  1. Dental examination.  2. Extraction of teeth #12, 13, 14, 15, 16, 17, 18, 19, 20, and 21.  3. Two quadrants of alveoloplasty.   SURGEON:  Charlynne Pander, D.D.S.   ASSISTANTS:  1. Elliot Dally (Sales executive).  2. Ascencion Dike (dental student).   ANESTHESIA:  1. General anesthesia via nasoendotracheal tube.  2. Local anesthesia with the total utilization of five carpules each     containing 36 mg of Xylocaine with 0.018 mg of epinephrine.   MEDICATIONS:  Toradol 30 mg IV at the end of the dental medicine procedure.   SPECIMENS:  There were 10 teeth, which were discarded.   DRAINS/CULTURES:  None.   COMPLICATIONS:  None.   FLUIDS REPLACED:  1100 mL of lactated Ringer's solution.   ESTIMATED BLOOD LOSS:  Less than 100 mL.   INDICATIONS:  The patient was admitted with a history of diabetic  ketoacidosis.  The patient also had been experiencing significant acute  pulpitis symptoms.  The patient was examined and treatment planned for  extraction of tooth #12, 13, 14, 15, 16, 17, 18, 19, 20, and 21, with  alveoloplasty and other preprosthetic surgery as indicated.  This treatment  plan was formulated to decrease the risks and  complications associated with  dental infection from affecting the patient's systemic health.   OPERATIVE FINDINGS:  The patient was examined in operating room #7.  The  teeth were identified for extraction.  At this time a periapical abscess was  noted to be on the lingual aspect of tooth #20 and 21.  The patient also was  noted to be affected by history of acute pulpitis symptoms and chronic  apical periodontitis.   DESCRIPTION OF PROCEDURE:  The patient was brought to the main operating  room #7.  The patient was placed in the supine position on the operating  room table.  General anesthesia was induced per the anesthesia team.  The  patient was then prepped and draped in the usual manner for a dental  medicine procedure.  A throat pack was placed at this time.  The oral cavity  was thoroughly examined with the findings as noted above.  The patient was  then ready for the oral surgical procedure as follows.   Local anesthesia was administered sequentially over the one hour-long  procedure with  the total utilization of five carpules each containing 36 mg  of Xylocaine with 0.018 mg of epinephrine.   The maxillary left quadrant was first approached.  A 15 blade incision was  made from the distal of the tuberosity through the mesial of tooth #12.  A  surgical flap was then reflected.  Tooth #12, 13, 14, 15, and 16 were  subluxated with a series of straight elevators.  Tooth #12 and 13 were then  removed with a 150 forceps without complications.  Tooth #14, 15, were then  removed with a 53L forceps.  The remaining root segment of #16 was then  elevated out with a Theatre manager.  Alveoplasty was then performed  utilizing a rongeur and bone file.  The surgical site was then irrigated  with copious amounts of sterile saline x2.  The surgical site was then  closed from the distal of the tuberosity through the mesial of tooth #12  utilizing 3-0 chromic gut suture in a continuous  interrupted suture  technique x1.   The mandibular left quadrant teeth were then approached.  The abscess was  identified on the lingual aspect and was palpated and approximately 5 mL of  purulent exudate was expressed at this time.  This was suctioned up and not  sent for culture.  A 15 blade incision was made from the distal of #17  through the mesial of #21.  A surgical flap was then reflected.  Tooth #17,  18, 19, 20, and 21 were then subluxated with a series of straight elevators.  Tooth #17, 18, and 19 were then removed with a 23 forceps without  complications.  Tooth #20 and 21 were then removed with a 151 forceps  without complications.  Alveoloplasty was then performed utilizing a rongeur  and bone file.  The surgical site was then irrigated with copious amounts of  sterile saline x4.  No further purulence was noted at the end of the  irrigation procedure.  The tissues were approximated and trimmed  appropriately.  The surgical site was then closed from the distal of #17  through the mesial of #21 utilizing 3-0 chromic gut suture in continuous  interrupted suture technique x1.  At this point in time the entire mouth was  irrigated with copious amounts of sterile saline.  The patient was examined  for complications.  Seeing none, the dental medicine procedure was deemed to  be complete.  The throat pack was removed at this time.  A series of 4 x 4  gauzes along with an oral airway were placed at that time at the request of  the anesthesia team.  The patient was then handed over to the anesthesia  team for final disposition.  After appropriate amount of time, the patient  was extubated and taken to the postanesthesia care unit with stable vital  signs and a good oxygenation level.  Prior to transport to the  postanesthesia care unit, the patient was given Toradol 30 mg to assist in  pain control and provide anti-inflammatory activity.  All counts were  correct for the dental  medicine procedure.  The patient will be followed in  approximately one week for evaluation for suture removal as indicated.  The  patient will be maintained on IV antibiotic therapy today and then will be  switched over  to Augmentin oral therapy and most likely discharged.  Ultimate discharge is at the discretion of Dr. Felicity Coyer.                                               Charlynne Pander, D.D.S.    RFK/MEDQ  D:  10/15/2003  T:  10/15/2003  Job:  [id number]   cc:   Rene Paci, M.D. [facility]  [address] [place], [place] [address]

## 2010-07-17 NOTE — Discharge Summary (Signed)
NAME:  Jay Barber, Jay Barber NO.:  [id number]   MEDICAL RECORD NO.:  [id number]                   PATIENT TYPE:  INP   LOCATION:  0349                                 FACILITY:  [facility]   PHYSICIAN:  Rene Paci, M.D. [facility]          DATE OF BIRTH:  1981/02/04   DATE OF ADMISSION:  10/13/2003  DATE OF DISCHARGE:  10/16/2003                                 DISCHARGE SUMMARY   DISCHARGE DIAGNOSES:  1. Status post below knee amputation.  2. Acidosis, resolved, electrolytes corrected.   Lantus Insulin plus sliding scale Humalog.  Discontinue Metformin and  glipizide.  Outpatient follow up in 48 hours.   Dental caries with abscess status post extraction and irrigation by Dr.  Kristin Bruins, October 15, 2003.  Continue oral antibiotics.  Follow up in 1 week  for suture removal and reevaluation.   DISCHARGE MEDICATIONS:  1. Lantus 26 units q.h.s.  2. Sliding scale Humalog q.a.c. and h.s.  3. Augmentin 500 mg p.o. b.i.d. x10 additional days.  4. Vicodin p.r.n.   DISPOSITION:  The patient is discharged home in a medically stable condition  tolerating a soft mechanical diet.  Pain controlled.  Understands plans for  Insulin and need for close follow up.  Hospital follow up scheduled for this  Friday, October 18, 2003 with primary care physician Dr. Gershon Crane at 3:15  p.m.  The patient is to bring his CBG log for further evaluation, review and  titration of his Lantus.  The patient is instructed not to return to work  until this visit.  The patient is to arrange 1 week follow up with dental  medicine, Dr. Kristin Bruins, for suture removal.   HOSPITAL COURSE BY PROBLEM:  PROBLEM 1.  Diabetic ketoacidosis. The patient  is a 30 year old diabetic previously on oral hypoglycemic agents and  Metformin who presented to the emergency room the day of admission with  weakness, nausea, vomiting and found to be in DKA with a pH of 7.04 and a  bicarb of 3.2.  Glucose at that time  was found to be 499.  He was admitted  to the ICU with an Insulin drip per Glucometer protocol and use of bicarb  drip as directed by admitting physician, Dr. Lovell Sheehan.  [name] DKA did  resoled and his acidosis gap closed.  he was begun on Lantus as the Insulin  drip was discontinued and this has been increasingly titrated over the 2  days of hospitalization following resolution of DKA.  It was felt that the  trigger was dental abscess with the patient's delayed intervention, as well  as noncompliance with medications due to mouth pain and decreased p.o.  intake.  The patient has been instructed on use of Insulin during this  hospitalization and will be going home on Lantus and sliding scale, further  titration per primary care physician.   PROBLEM 2.  Dental abscess.  The patient has been  followed by Dr. Kristin Bruins  of hospital dental medicine for this problem as an outpatient prior to  admission but due to the exacerbation and DKA from this infection, patient  underwent surgical extraction and irrigation as described in the operative  note dated October 15, 2003.  The patient  has tolerated the procedure well.  On the day of discharge he is tolerating  a soft mechanical diet without difficulty and understands need to complete  antibiotic therapy as well as Vicodin p.r.n. for pain.  He is to arrange  hospital follow up after discharge.                                               Rene Paci, M.D. [facility]    VL/MEDQ  D:  10/16/2003  T:  10/17/2003  Job:  [id number]

## 2010-07-17 NOTE — H&P (Signed)
Jay Barber, Jay Barber NO.:  [id number]   MEDICAL RECORD NO.:  [id number]          PATIENT TYPE:  INP   LOCATION:  5742                         FACILITY:  [facility]   PHYSICIAN:  Gordy Savers, MDDATE OF BIRTH:  [birth year]   DATE OF ADMISSION:  06/18/2006  DATE OF DISCHARGE:                              HISTORY & PHYSICAL   CHIEF COMPLAINT:  Abdominal pain, shortness of breath.   HISTORY OF PRESENT ILLNESS:  The patient is a 30 year old gentleman with  a history of diabetes, insulin-requiring since 2000.  Nine days ago he  underwent multiple dental extractions and has been eating very poorly.  Yesterday he had the onset of some shortness of breath and was initially  evaluated at [facility] ED.  He received a chest x-ray that was  negative, and a breathing treatment but left without complete  evaluation.  He presents to [facility] Emergency Department today  complaining of worsening abdominal pain, nausea and vomiting.  In the ED  setting, he was treated with Dilaudid and Zofran with resolution of his  symptoms.  He presented quite ill with tachycardia, and laboratory  studies were consistent with diabetic ketoacidosis with a blood sugar in  excess of 500.  He is now admitted for further evaluation and treatment  of diabetic ketoacidosis.   PAST MEDICAL HISTORY:  1. History of diabetes since 2000.  He has been followed by Dr.      Everardo All.  2. He was admitted to the hospital in August 2005 for diabetic      ketoacidosis.  3. As mentioned, he had multiple teeth extractions performed on June 09, 2006.   PRESENT MEDICAL REGIMEN:  1. Lantus 10-29 units q.h.s.  2. NovoLog prior to each meal, 5-10 units t.i.d.  3. Following his dental extractions, he has been on ibuprofen and      Percocet.  4. Additionally he takes Ambien p.r.n. and recently has been started      on Lexapro.   FAMILY HISTORY:  Father, 37, has coronary artery disease status post  CABG.  Mother is in good health at 33.  No family history of diabetes.   SOCIAL HISTORY:  One pack-per-day smoker, lives with his parents,  single.   PHYSICAL EXAMINATION:  GENERAL:  Thin white male who appeared slightly  ill but no longer in any acute distress.  VITAL SIGNS:  Pulse 106, temperature afebrile, blood pressure 120/78.  HEENT:  Head and neck revealed normal pupil responses, conjunctivae  clear, although slightly injected.  Ear, nose, and throat unremarkable  status post recent complete dental extractions.  NECK:  No adenopathy or neck vein distention.  CHEST:  Clear.  CARDIOVASCULAR:  Regular tachycardia without murmurs.  ABDOMEN:  Faint bowel sounds.  There was no distention.  There is no  significant tenderness to palpation.  No guarding was present.  EXTREMITIES:  Some scarring along the anterior shins.  No edema.   LABORATORY STUDIES:  White count of 17.3, H&H 16.7, 49.3.  Electrolytes  revealed potassium of 5.3, CO2 content of 8, blood  sugar 507, and acid  base deficit of 24.  Chest x-ray revealed no active disease.   DISPOSITION:  The patient will be admitted for treatment of his diabetic  ketoacidosis.  He will be treated with fluid resuscitation and has  already been placed on a Glucomander.  Blood sugars will be followed  hourly and electrolytes followed frequently.  He has been make NPO and  challenged with clear liquid diet if he remains stable.      Gordy Savers, MD  Electronically Signed     PFK/MEDQ  D:  06/18/2006  T:  06/19/2006  Job:  [id number]

## 2010-07-17 NOTE — Assessment & Plan Note (Signed)
[facility]                         GASTROENTEROLOGY OFFICE NOTE   NAME:JOHNSONBraelyn, Bordonaro                      MRN:          [id number]  DATE:05/16/2007                            DOB:          04-27-1980    CHIEF COMPLAINT:  Persistent diarrhea.   Stephanie was seen in October of 2008.  He had about a two-month history of  postprandial watery diarrhea at that time.  He was worked up with stool  studies, which showed negative cultures, C. Difficile, Giardia,  cryptosporidium, but he had a positive fecal lactoferrin.  I prescribed  Cipro and Flagyl and asked him to follow up.  He did not follow up.  He  claims the Cipro made his blood sugar go down.  It could have been the  Flagyl.  At any rate, he had some hypoglycemia.  He was told to adjust  his medications, i.e. insulin, by Dr. Everardo All, but he did not do so and  he never completed the antibiotics.  He essentially has the same problem  with postprandial watery stools at other times and I think he has some  occasional or rare nocturnal diarrhea.  He has not had any bleeding or  mucus.  He is using some Imodium with mild relief.  He is describing  minimal, if any, pain.  His weight has been relatively stable.  He has  tried [employer] with minimal relief also.   MEDICATIONS:  1. Lantus sliding scale.  2. NovoLog sliding scale.  3. Sertraline 100 mg daily.  4. Trazodone 50 mg at bedtime p.r.n.   SOCIAL HISTORY:  He does use some alcohol, up to eight beers at a time,  though he denies excessive chronic use.   PAST MEDICAL HISTORY:  Anxiety, gastroesophageal reflux disease,  hypertension.  He is a smoker.  Type 1 diabetes mellitus.   PHYSICAL EXAM:  Well-developed, well-nourished, no acute distress.  Weight 151 pounds, pulse 72, blood pressure 120/74.  EYES:  Anicteric.  NECK:  Supple.  ABDOMEN:  Soft and nontender without organomegaly or mass.  EXTREMITIES:  Free of edema.  SKIN:  Warm and dry,  no acute rash.   ASSESSMENT:  Chronic diarrhea, suggestive of inflammatory diarrhea with  positive fecal lactoferrin.  He is a type 1 diabetic, as well.   PLAN:  1. Lomotil is prescribed to be used as needed.  2. Colonoscopy with plan for terminal ileum intubation and random      biopsies, at least, June 01, 2007.  3. If that is unrevealing, given that he is a type 1 diabetic, celiac      testing would be appropriate.  In fact, at this time, I will ask      him to have that blood work drawn in the interim.     Iva Boop, MD,FACG  Electronically Signed    CEG/MedQ  DD: 05/17/2007  DT: 05/17/2007  Job #: [id number]   cc:   Gregary Signs A. Everardo All, MD

## 2010-07-17 NOTE — Discharge Summary (Signed)
NAME:  Jay Barber, Jay Barber               ACCOUNT NO.:  [id number]   MEDICAL RECORD NO.:  [id number]          PATIENT TYPE:  INP   LOCATION:  5742                         FACILITY:  [facility]   PHYSICIAN:  Valerie A. Felicity Coyer, MDDATE OF BIRTH:  06-13-1980   DATE OF ADMISSION:  06/18/2006  DATE OF DISCHARGE:  06/21/2006                               DISCHARGE SUMMARY   DISCHARGE DIAGNOSES:  1. Diabetes type 1, uncontrolled, with diabetic ketoacidosis.  2. Active tobacco abuse.  3. Status post dental extractions June 09, 2006.   HISTORY OF PRESENT ILLNESS:  Jay Barber is a 30 year old male with a  history of diabetes type 1 who was admitted on June 18, 2006 with a  chief complaint of abdominal pain and shortness of breath.  He underwent  multiple dental extractions on June 09, 2006 and following these  extractions had been eating very poorly.  He developed onset of  shortness of breath on June 17, 2006 and was evaluated in the [facility] Emergency Department.  At that time, he received a chest x-ray,  which was negative and a breathing treatment but apparently left without  complete evaluation.  He was admitted to the [facility] Emergency  Department on June 18, 2006 complaining of worsening abdominal pain,  nausea and vomiting.  He was noted to have laboratory studies consistent  with diabetic ketoacidosis and was noted to have a blood sugar in excess  of 500.  The patient was admitted for further evaluation and treatment.   COURSE OF HOSPITALIZATION:  Problem #1:  DKA.  The patient was admitted.  He was noted on admission to have a bicarb of 9.  He was started on a  Glucommander protocol and successfully transitioned over to Lantus  insulin.  At the time of discharge his blood sugar is 137.  He is  tolerating orals and feels well.  He was noted to have an elevated  hemoglobin A1C indicating poor control.  A1C was 10.1.  He was given 15  units of Lantus on June 20, 2006 and has been  maintained on meal  coverage.  Today's blood sugar on 15 units of Lantus raises the question  of compliance at home.  The patient is agreeable to attend diabetes  education classes as an outpatient.  We will ask that this be arranged  at the time of discharge.   MEDICATIONS ON DISCHARGE:  1. Lantus insulin 15-23 units once daily in the evening, as before.  2. NovoLog sliding scale coverage 5-10 units before meals, as before.  3. Lexapro 10 mg p.o. daily.  4. Ambien 5 mg h.s. as needed.   LABORATORY DATA ON DISCHARGE:  Sodium 137, BUN 6, creatinine 0.82.  Hemoglobin A1C 10.1.   DISPOSITION:  The patient will be discharged to home.   FOLLOWUP:  The patient is instructed to follow up with Dr. Romero Belling  on April 29th at 9:15 a.m.  He is instructed to contact Dr. Everardo All  should he develop sugars greater than 300 or less than 80 and return to  the emergency room if he develops  abdominal pain, nausea or vomiting.      Jay Craze, NP      Jay Barber. Felicity Coyer, MD  Electronically Signed    MO/MEDQ  D:  06/21/2006  T:  06/21/2006  Job:  [id number]   cc:   Gregary Signs A. Everardo All, MD

## 2010-12-01 LAB — BASIC METABOLIC PANEL
BUN: 2 — ABNORMAL LOW
BUN: 7
BUN: 9
BUN: 9
CO2: 20
CO2: 21
CO2: 23
CO2: 7 — CL
Calcium: 8 — ABNORMAL LOW
Calcium: 8.1 — ABNORMAL LOW
Calcium: 8.2 — ABNORMAL LOW
Calcium: 8.3 — ABNORMAL LOW
Chloride: 112
Chloride: 99
Creatinine, Ser: 0.71
Creatinine, Ser: 0.81
Creatinine, Ser: 0.98
GFR calc Af Amer: 56 — ABNORMAL LOW
GFR calc Af Amer: >60
GFR calc Af Amer: >60
GFR calc non Af Amer: >60
Glucose, Bld: 154 — ABNORMAL HIGH
Glucose, Bld: 195 — ABNORMAL HIGH
Glucose, Bld: 224 — ABNORMAL HIGH
Glucose, Bld: 459 — ABNORMAL HIGH
Sodium: 136
Sodium: 137

## 2010-12-01 LAB — COMPREHENSIVE METABOLIC PANEL
Albumin: 4.2
BUN: 12
Calcium: 8.4
Glucose, Bld: 178 — ABNORMAL HIGH
Potassium: 5.1
Sodium: 138
Total Protein: 6.6

## 2010-12-01 LAB — GLUCOSE, CAPILLARY
Glucose-Capillary: 110 — ABNORMAL HIGH
Glucose-Capillary: 119 — ABNORMAL HIGH
Glucose-Capillary: 126 — ABNORMAL HIGH
Glucose-Capillary: 157 — ABNORMAL HIGH
Glucose-Capillary: 170 — ABNORMAL HIGH
Glucose-Capillary: 174 — ABNORMAL HIGH
Glucose-Capillary: 192 — ABNORMAL HIGH
Glucose-Capillary: 197 — ABNORMAL HIGH
Glucose-Capillary: 203 — ABNORMAL HIGH
Glucose-Capillary: 207 — ABNORMAL HIGH
Glucose-Capillary: 232 — ABNORMAL HIGH
Glucose-Capillary: 69 — ABNORMAL LOW
Glucose-Capillary: 84
Glucose-Capillary: 95
Glucose-Capillary: 97

## 2010-12-01 LAB — URINALYSIS, ROUTINE W REFLEX MICROSCOPIC
Bilirubin Urine: NEGATIVE
Glucose, UA: 250 — AB
Ketones, ur: >80 — AB
Nitrite: NEGATIVE
Protein, ur: 30 — AB
pH: 5.5

## 2010-12-01 LAB — HEPATIC FUNCTION PANEL
Albumin: 4.8
Alkaline Phosphatase: 94
Bilirubin, Direct: 0.2
Indirect Bilirubin: 2.3 — ABNORMAL HIGH
Total Bilirubin: 2.5 — ABNORMAL HIGH

## 2010-12-01 LAB — DIFFERENTIAL
Basophils Relative: 2 — ABNORMAL HIGH
Eosinophils Absolute: 0
Lymphs Abs: 1.2
Monocytes Absolute: 0.8
Neutro Abs: 18.4 — ABNORMAL HIGH

## 2010-12-01 LAB — CULTURE, BLOOD (ROUTINE X 2)

## 2010-12-01 LAB — BLOOD GAS, VENOUS
Acid-base deficit: 24.2 — ABNORMAL HIGH
Bicarbonate: 8.2 — ABNORMAL LOW
Patient temperature: 98.6
TCO2: 8.2
pH, Ven: 6.99 — CL

## 2010-12-01 LAB — CBC
HCT: 52.8 — ABNORMAL HIGH
Hemoglobin: 17.3 — ABNORMAL HIGH
MCHC: 32.7
MCV: 99.1
RBC: 5.32
RDW: 12.7

## 2010-12-01 LAB — LIPASE, BLOOD: Lipase: 15

## 2010-12-01 LAB — URINE CULTURE: Culture: NO GROWTH

## 2010-12-01 LAB — CK TOTAL AND CKMB (NOT AT ARMC)
CK, MB: 4.1 — ABNORMAL HIGH
Total CK: 58

## 2012-09-15 ENCOUNTER — Encounter (HOSPITAL_BASED_OUTPATIENT_CLINIC_OR_DEPARTMENT_OTHER): Payer: Self-pay

## 2012-10-27 ENCOUNTER — Encounter (HOSPITAL_COMMUNITY): Payer: Self-pay | Admitting: Emergency Medicine

## 2012-10-27 ENCOUNTER — Emergency Department (HOSPITAL_COMMUNITY)
Admission: EM | Admit: 2012-10-27 | Discharge: 2012-10-27 | Disposition: A | Discharge status: Home / Self Care | Payer: Self-pay | Attending: Emergency Medicine | Admitting: Emergency Medicine

## 2012-10-27 ENCOUNTER — Emergency Department (HOSPITAL_COMMUNITY): Payer: Self-pay

## 2012-10-27 DIAGNOSIS — I739 Peripheral vascular disease, unspecified: Secondary | ICD-10-CM | POA: Insufficient documentation

## 2012-10-27 DIAGNOSIS — S99922A Unspecified injury of left foot, initial encounter: Secondary | ICD-10-CM

## 2012-10-27 DIAGNOSIS — M79609 Pain in unspecified limb: Secondary | ICD-10-CM

## 2012-10-27 DIAGNOSIS — S90129A Contusion of unspecified lesser toe(s) without damage to nail, initial encounter: Secondary | ICD-10-CM | POA: Insufficient documentation

## 2012-10-27 DIAGNOSIS — Y9289 Other specified places as the place of occurrence of the external cause: Secondary | ICD-10-CM | POA: Insufficient documentation

## 2012-10-27 DIAGNOSIS — S8990XA Unspecified injury of unspecified lower leg, initial encounter: Secondary | ICD-10-CM | POA: Insufficient documentation

## 2012-10-27 DIAGNOSIS — R0989 Other specified symptoms and signs involving the circulatory and respiratory systems: Secondary | ICD-10-CM

## 2012-10-27 DIAGNOSIS — Y990 Civilian activity done for income or pay: Secondary | ICD-10-CM | POA: Insufficient documentation

## 2012-10-27 DIAGNOSIS — W208XXA Other cause of strike by thrown, projected or falling object, initial encounter: Secondary | ICD-10-CM | POA: Insufficient documentation

## 2012-10-27 DIAGNOSIS — Y9389 Activity, other specified: Secondary | ICD-10-CM | POA: Insufficient documentation

## 2012-10-27 HISTORY — DX: Type 2 diabetes mellitus without complications: E11.9

## 2012-10-27 LAB — CBC WITH DIFFERENTIAL/PLATELET
Eosinophils Absolute: 0.1 10*3/uL (ref 0.0–0.7)
Eosinophils Relative: 1 % (ref 0–5)
HCT: 42.5 % (ref 39.0–52.0)
Hemoglobin: 15.2 g/dL (ref 13.0–17.0)
Lymphocytes Relative: 14 % (ref 12–46)
Lymphs Abs: 1.2 10*3/uL (ref 0.7–4.0)
MCH: 32.6 pg (ref 26.0–34.0)
MCV: 91.2 fL (ref 78.0–100.0)
Monocytes Absolute: 0.9 10*3/uL (ref 0.1–1.0)
Monocytes Relative: 10 % (ref 3–12)
Platelets: 187 10*3/uL (ref 150–400)
RBC: 4.66 MIL/uL (ref 4.22–5.81)
WBC: 8.5 10*3/uL (ref 4.0–10.5)

## 2012-10-27 LAB — BASIC METABOLIC PANEL
BUN: 7 mg/dL (ref 6–23)
CO2: 28 mEq/L (ref 19–32)
Calcium: 8.9 mg/dL (ref 8.4–10.5)
GFR calc non Af Amer: >90 mL/min (ref >90)
Glucose, Bld: 203 mg/dL — ABNORMAL HIGH (ref 70–99)
Sodium: 135 mEq/L (ref 135–145)

## 2012-10-27 MED ORDER — CEPHALEXIN 500 MG PO CAPS: 500.0000 mg | ORAL_CAPSULE | Freq: Four times a day (QID) | ORAL | Status: DC

## 2012-10-27 MED ORDER — HYDROCODONE-ACETAMINOPHEN 5-325 MG PO TABS: 1.0000 | ORAL_TABLET | ORAL | Status: DC | PRN

## 2012-10-27 NOTE — ED Provider Notes (Signed)
Medical screening examination/treatment/procedure(s) were conducted as a shared visit with non-physician practitioner(s) and myself.  I personally evaluated the patient during the encounter  Examined This Patient. He's Had What Sounds like Claudication for a Month. He Has an Ecchymotic Area to His Left Small Toe Does Not Look Cellulitic in His Left Mid Anterior Lower Leg. It Is a Sudden Onset of Pain or Disuse or Paresis of the Right Currently It Is Only with Exertion. This Is a Chronic Claudication Nonacute Arterial Compromise Is Refill the Leg at Rest His to 3 Seconds and Not Painful. Plan Aspirin, absolutely Stop Smoking. Antibiotics. Pain Medication. Vascular Surgical Followup    Claudean Kinds, MD 10/27/12 1137

## 2012-10-27 NOTE — ED Notes (Signed)
Lt leg pain for the past 3 days now. Denies any idnjury.

## 2012-10-27 NOTE — Progress Notes (Signed)
[facility] provided pt with a [facility] card application.

## 2012-10-27 NOTE — Progress Notes (Signed)
*  Preliminary Results* Left lower extremity venous duplex completed. Left lower extremity is negative for deep vein thrombosis. There is no evidence of left Baker's cyst.  Incidental finding: As I approached the popliteal fossa, the popliteal artery was noted to have significant heterogenous calcific plaque. 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   ARTERIAL  ABI completed:    RIGHT    LEFT    PRESSURE WAVEFORM  PRESSURE WAVEFORM  BRACHIAL 155 Triphasic BRACHIAL 153 Triphasic  DP 152 Triphasic DP 29 Severely dampened monophasic  AT   AT    PT 160 Triphasic PT 40 Severely dampened monophasic  PER   PER    GREAT TOE  NA GREAT TOE  NA    RIGHT LEFT  ABI 1.03 0.26   The right ABI is within normal limits. The left ABI is suggestive of severe arterial insufficiency.   10/27/2012 11:17 AM  Gertie Fey, RVT, RDCS, RDMS

## 2012-10-27 NOTE — ED Provider Notes (Signed)
CSN: [id number]     Arrival date & time 10/27/12  7829 History   First MD Initiated Contact with Patient 10/27/12 [phone]     Chief Complaint  Patient presents with  . Leg Pain   (Consider location/radiation/quality/duration/timing/severity/associated sxs/prior Treatment) The history is provided by the patient and medical records.   Pt presents to the ED for left 5th toe and left calf pain x 3 days.  Pt states he dropped a 60 lb vice on his left foot while working.  States he was wearing steel toe work boots and initially felt fine afterwards.  Yesterday he noticed that his left 5th toe was black and blue. No numbness or paresthesias. Left calf pain has been progressively worsening, described as a "tight" sensation while walking.  Denies any increase in physical activity to cause muscle strain.  No recent surgeries or travel.  No prior hx of DVT.  No chest pain or SOB.  Pt is a daily smoker.  No past medical history on file. No past surgical history on file. No family history on file. History  Substance Use Topics  . Smoking status: Not on file  . Smokeless tobacco: Not on file  . Alcohol Use: Not on file    Review of Systems  Musculoskeletal: Positive for myalgias and arthralgias.  All other systems reviewed and are negative.    Allergies  Review of patient's allergies indicates not on file.  Home Medications  No current outpatient prescriptions on file. BP 133/81  Pulse 78  Temp(Src) 97.6 F (36.4 C) (Oral)  Resp 15  SpO2 100%  Physical Exam  Nursing note and vitals reviewed. Constitutional: He is oriented to person, place, and time. He appears well-developed and well-nourished.  HENT:  Head: Normocephalic and atraumatic.  Eyes: Conjunctivae and EOM are normal.  Neck: Normal range of motion. Neck supple.  Cardiovascular: Normal rate, regular rhythm and normal heart sounds.   Pulmonary/Chest: Effort normal and breath sounds normal.  Musculoskeletal: Normal range of  motion. He exhibits no edema.       Feet:  Left 5th toe with obvious bruising and swelling; limited flexion/extension; nail intact; no active bleeding, drainage, or signs of infection; sensation intact Left calf pain without notable asymmetry or palpable cord; some erythematous streaking up anterior calf; strong distal pulse and cap refill  Neurological: He is alert and oriented to person, place, and time.  Skin: Skin is warm and dry.  Psychiatric: He has a normal mood and affect.    ED Course  Procedures (including critical care time) Labs Review Labs Reviewed - No data to display Imaging Review Dg Toe 5th Left  10/27/2012   *RADIOLOGY REPORT*  Clinical Data: Left fifth toe pain after injury.  DG TOE 5TH LEFT  Comparison: None.  Findings: Joint spaces appear intact. Possible nondisplaced fracture is seen involving the fifth distal phalanx in its proximal base.  No soft tissue abnormality is noted.  IMPRESSION: Possible nondisplaced fracture involving proximal base of the fifth distal phalanx.   Original Report Authenticated By: Lupita Raider.,  M.D.    MDM   1. Foot injury, left, initial encounter   2. Claudication     X-ray as above-- questionable fx of left 5th digit with possible mild lymphangitis of left pretibial region. LE duplex negative for DVT but significant PAD.  Vasc tech notes 50-99% stenosis of left popliteal artery with left ABI of 0.26, right 1.03.  Pt will be started on daily ASA, encouraged to stop  smoking, and FU with vascular surgery.  Rx keflex and vicodin.  Discussed plan with pt, they agreed.  Return precautions advised.  Discussed with Dr. Fayrene Fearing who personally evaluated pt and agrees with plan.  Garlon Hatchet, PA-C 10/27/12 1354

## 2012-10-29 NOTE — ED Provider Notes (Signed)
Medical screening examination/treatment/procedure(s) were conducted as a shared visit with non-physician practitioner(s) and myself.  I personally evaluated the patient during the encounter  I personally evaluated this patient. He did have an injury, as well as second condition which is likely claudication. He appears to have a cellulitis on the dorsum of his foot. Section extends minimally proximal onto the dorsum of the foot.  His system suggest claudication his ABI is low. Plan absolute stop smoking. And about treatment for cellulitis. Continue a Bruce protocol like exercise. Aspirin daily. Vascular surgical referral.  Claudean Kinds, MD 10/29/12 [phone]

## 2012-10-31 ENCOUNTER — Telehealth: Payer: Self-pay | Admitting: Vascular Surgery

## 2012-10-31 NOTE — Telephone Encounter (Signed)
Spoke with pt, confirmed appt date and time - kf

## 2012-11-01 ENCOUNTER — Encounter: Payer: Self-pay | Admitting: Vascular Surgery

## 2012-11-01 ENCOUNTER — Ambulatory Visit (INDEPENDENT_AMBULATORY_CARE_PROVIDER_SITE_OTHER): Payer: Self-pay | Admitting: Vascular Surgery

## 2012-11-01 VITALS — BP 129/77 | HR 81 | Ht 71.0 in | Wt 134.0 lb

## 2012-11-01 DIAGNOSIS — I739 Peripheral vascular disease, unspecified: Secondary | ICD-10-CM | POA: Insufficient documentation

## 2012-11-01 NOTE — Progress Notes (Signed)
[facility] [place]  Patient name: Jay Barber MRN: [id number] DOB: 04/27/1980 Sex: male  REASON FOR CONSULT: left foot wound. Referred by the emergency department.  HPI: Jay Barber is a 32 y.o. male dropped a vice on his left foot 7-10 days ago. He was seen in the emergency department. X-ray showed no evidence of fracture. However he had a wound on his fifth toe and he sent for vascular consultation. Prior to this he does admit a long history of left calf claudication. He developed pain in his left calf which is brought on by ambulation and relieved with rest. He denies any history of previous rest pain or previous history of nonhealing ulcers. He does have diabetes and also a 2 pack per day history of smoking since he was 32 years old.  Past Medical History  Diagnosis Date  . Diabetes mellitus without complication    He denies any history of hypertension, hypercholesterolemia, history of previous myocardial infarction or history congestive heart failure.  Family History  Problem Relation Age of Onset  . Heart disease Father    He does have a history of premature cardiovascular disease in the family. His father had coronary disease at age 58.  For VQI Use Only  PRE-ADM LIVING: [X ] Home, [ ] Nursing home, [ ] Homeless  AMB STATUS: [X ] Walking, [ ] Walking w/ Assistance, [ ] Wheelchair, [ ]Bed ridden  RECENT HEART ATTACK (<6 mon): No  CAD Sx: [X ] No, [ ] Asx, h/o MI, [ ] Stable angina, [ ] Unstable angina  PRIOR CHF: [X ] No, [ ] Asx, [ ] Mild, [ ] Moderate, [ ] Severe  STRESS TEST: [X ] No, [ ] Normal, [ ] + ischemia, [ ] + MI, [ ] Both  SOCIAL HISTORY: History  Substance Use Topics  . Smoking status: Heavy Tobacco Smoker -- 2.00 packs/day for 13 years    Types: Cigarettes  . Smokeless tobacco: Not on file  . Alcohol Use: 10.8 oz/week    18 Cans of beer per week    No Known Allergies  Current Outpatient Prescriptions  Medication Sig  Dispense Refill  . cephALEXin (KEFLEX) 500 MG capsule Take 1 capsule (500 mg total) by mouth 4 (four) times daily.  40 capsule  0  . insulin aspart (NOVOLOG) 100 UNIT/ML injection Inject 2-4 Units into the skin 4 (four) times daily -  before meals and at bedtime. Uses a sliding scale.      . insulin glargine (LANTUS) 100 UNIT/ML injection Inject 15 Units into the skin at bedtime.      . HYDROcodone-acetaminophen (NORCO/VICODIN) 5-325 MG per tablet Take 1 tablet by mouth every 4 (four) hours as needed for pain.  10 tablet  0   No current facility-administered medications for this visit.   REVIEW OF SYSTEMS: [X ] denotes positive finding; [  ] denotes negative finding  CARDIOVASCULAR:  [ ] chest pain   [ ] chest pressure   [ ] palpitations   [ ] orthopnea   [ ] dyspnea on exertion   [X ] claudication left calf  [ ] rest pain   [ ] DVT   [ ] phlebitis PULMONARY:   [ ] productive cough   [ ] asthma   [ ] wheezing NEUROLOGIC:   [ ] weakness  [ ] paresthesias  [ ] aphasia  [ ] amaurosis  [ ] dizziness HEMATOLOGIC:   [ ] bleeding   problems   [ ] clotting disorders MUSCULOSKELETAL:  [ ] joint pain   [ ] joint swelling [ ] leg swelling GASTROINTESTINAL: [ ]  blood in stool  [ ]  hematemesis GENITOURINARY:  [ ]  dysuria  [ ]  hematuria PSYCHIATRIC:  [ ] history of major depression INTEGUMENTARY:  [ ] rashes  [ ] ulcers CONSTITUTIONAL:  [ ] fever   [ ] chills  PHYSICAL EXAM: Filed Vitals:   11/01/12 1149  BP: 129/77  Pulse: 81  Height: 5' 11" (1.803 m)  Weight: 134 lb (60.782 kg)  SpO2: 100%   Body mass index is 18.7 kg/(m^2). GENERAL: The patient is a well-nourished male, in no acute distress. The vital signs are documented above. CARDIOVASCULAR: There is a regular rate and rhythm. I do not detect carotid bruits. He has palpable femoral pulses bilaterally. On the left side, which is the symptomatic side, he does have a palpable popliteal pulse. I cannot palpate pedal pulses. The right side he  has a palpable popliteal pulse and weakly palpable dorsalis pedis and posterior tibial pulses. He has no significant lower extremity swelling. PULMONARY: There is good air exchange bilaterally without wheezing or rales. ABDOMEN: Soft and non-tender with normal pitched bowel sounds. I cannot palpate an aneurysm. MUSCULOSKELETAL: He has dry gangrene of his left fifth toe with some mild cellulitis on the lateral aspect of his foot which he says is improving. NEUROLOGIC: No focal weakness or paresthesias are detected. SKIN: There are no ulcers or rashes noted. PSYCHIATRIC: The patient has a normal affect.  DATA:  I have reviewed his venous duplex scan from [facility] which showed no evidence of DVT in the left lower extremity.  I also reviewed his arterial duplex study and ABIs.  His ABI on the right is 100%. ABI on the left is 26%. He has monophasic Doppler signals in the anterior tibial and posterior tibial positions on the left. On the right side he has triphasic anterior tibial and posterior tibial signals. Duplex of the left lower extremity shows a 50-99% left proximal artery stenosis.  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 In addition he has heavy smoking history and is diabetic. This could become a limb threatening problem. I recommended that we proceed with arteriography. I have reviewed with the patient the indications for arteriography. In addition, I have reviewed the potential complications of arteriography including but not limited to: Bleeding, arterial injury, arterial thrombosis, dye action, renal insufficiency, or other unpredictable medical problems. I have explained to the patient that if we find disease amenable to angioplasty we could potentially address this at the same time. I have discussed the potential complications of angioplasty and  stenting, including but not limited to: Bleeding, arterial thrombosis, arterial injury, dissection, or the need for surgical intervention. His procedure is scheduled for 11/06/2012. We'll make further recommendations pending these results.   in addition, we have had a long discussion about the importance of tobacco cessation. We discussed the fact on atherosclerotic disease and progression of his disease. He understands that given his diabetes smoking puts him at significant risk for limb loss in the future. We also discussed the affected nicotine has on vasospasm in his ability to heal the wound on his left foot. He does seem motivated to try to quit.    Deeric Cruise S [facility] [place] Beeper: [phone]    

## 2012-11-01 NOTE — Assessment & Plan Note (Signed)
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 In addition he has heavy smoking history and is diabetic. This could become a limb threatening problem. I recommended that we proceed with arteriography. I have reviewed with the patient the indications for arteriography. In addition, I have reviewed the potential complications of arteriography including but not limited to: Bleeding, arterial injury, arterial thrombosis, dye action, renal insufficiency, or other unpredictable medical problems. I have explained to the patient that if we find disease amenable to angioplasty we could potentially address this at the same time. I have discussed the potential complications of angioplasty and stenting, including but not limited to: Bleeding, arterial thrombosis, arterial injury, dissection, or the need for surgical intervention. His procedure is scheduled for 11/06/2012. We'll make further recommendations pending these results.

## 2012-11-02 ENCOUNTER — Encounter (HOSPITAL_COMMUNITY): Payer: Self-pay | Admitting: Pharmacy Technician

## 2012-11-02 ENCOUNTER — Other Ambulatory Visit: Payer: Self-pay

## 2012-11-06 ENCOUNTER — Telehealth: Payer: Self-pay | Admitting: Vascular Surgery

## 2012-11-06 ENCOUNTER — Encounter (HOSPITAL_COMMUNITY)
Admission: RE | Disposition: A | Discharge status: Home / Self Care | Payer: Self-pay | Source: Ambulatory Visit | Attending: Vascular Surgery

## 2012-11-06 ENCOUNTER — Ambulatory Visit (HOSPITAL_COMMUNITY)
Admission: RE | Admit: 2012-11-06 | Discharge: 2012-11-06 | Disposition: A | Discharge status: Home / Self Care | Payer: Self-pay | Source: Ambulatory Visit | Attending: Vascular Surgery | Admitting: Vascular Surgery

## 2012-11-06 DIAGNOSIS — L98499 Non-pressure chronic ulcer of skin of other sites with unspecified severity: Secondary | ICD-10-CM

## 2012-11-06 DIAGNOSIS — I7092 Chronic total occlusion of artery of the extremities: Secondary | ICD-10-CM | POA: Insufficient documentation

## 2012-11-06 DIAGNOSIS — I739 Peripheral vascular disease, unspecified: Secondary | ICD-10-CM | POA: Insufficient documentation

## 2012-11-06 DIAGNOSIS — E119 Type 2 diabetes mellitus without complications: Secondary | ICD-10-CM | POA: Insufficient documentation

## 2012-11-06 DIAGNOSIS — F17200 Nicotine dependence, unspecified, uncomplicated: Secondary | ICD-10-CM | POA: Insufficient documentation

## 2012-11-06 DIAGNOSIS — L97509 Non-pressure chronic ulcer of other part of unspecified foot with unspecified severity: Secondary | ICD-10-CM | POA: Insufficient documentation

## 2012-11-06 DIAGNOSIS — Z794 Long term (current) use of insulin: Secondary | ICD-10-CM | POA: Insufficient documentation

## 2012-11-06 HISTORY — PX: ABDOMINAL AORTAGRAM: SHX5454

## 2012-11-06 LAB — POCT I-STAT, CHEM 8
BUN: 4 mg/dL — ABNORMAL LOW (ref 6–23)
Calcium, Ion: 1.13 mmol/L (ref 1.12–1.23)
Chloride: 103 mEq/L (ref 96–112)
Creatinine, Ser: 0.8 mg/dL (ref 0.50–1.35)
TCO2: 29 mmol/L (ref 0–100)

## 2012-11-06 LAB — GLUCOSE, CAPILLARY

## 2012-11-06 SURGERY — ABDOMINAL AORTAGRAM
Anesthesia: LOCAL

## 2012-11-06 MED ORDER — FENTANYL CITRATE 0.05 MG/ML IJ SOLN
INTRAMUSCULAR | Status: AC
  Filled 2012-11-06: qty 2

## 2012-11-06 MED ORDER — HEPARIN (PORCINE) IN NACL 2-0.9 UNIT/ML-% IJ SOLN
INTRAMUSCULAR | Status: AC
  Filled 2012-11-06: qty 1000

## 2012-11-06 MED ORDER — MIDAZOLAM HCL 2 MG/2ML IJ SOLN
INTRAMUSCULAR | Status: AC
  Filled 2012-11-06: qty 2

## 2012-11-06 MED ORDER — SODIUM CHLORIDE 0.9 % IV SOLN: INTRAVENOUS | Status: DC

## 2012-11-06 MED ORDER — ACETAMINOPHEN 325 MG PO TABS: 650.0000 mg | ORAL_TABLET | ORAL | Status: DC | PRN

## 2012-11-06 MED ORDER — ONDANSETRON HCL 4 MG/2ML IJ SOLN: 4.0000 mg | Freq: Four times a day (QID) | INTRAMUSCULAR | Status: DC | PRN

## 2012-11-06 MED ORDER — LIDOCAINE HCL (PF) 1 % IJ SOLN
INTRAMUSCULAR | Status: AC
  Filled 2012-11-06: qty 30

## 2012-11-06 NOTE — Interval H&P Note (Signed)
History and Physical Interval Note:  11/06/2012 11:07 AM  Laurine Blazer  has presented today for surgery, with the diagnosis of pvd with ulcer  The various methods of treatment have been discussed with the patient and family. After consideration of risks, benefits and other options for treatment, the patient has consented to  Procedure(s): ABDOMINAL AORTAGRAM (N/A) as a surgical intervention .  [name] history has been reviewed, patient examined, no change in status, stable for surgery.  I have reviewed the patient's chart and labs.  Questions were answered to the patient's satisfaction.     Karisha Marlin S

## 2012-11-06 NOTE — Interval H&P Note (Signed)
History and Physical Interval Note:  11/06/2012 10:34 AM  Laurine Blazer  has presented today for surgery, with the diagnosis of pvd with ulcer  The various methods of treatment have been discussed with the patient and family. After consideration of risks, benefits and other options for treatment, the patient has consented to  Procedure(s): ABDOMINAL AORTAGRAM (N/A) as a surgical intervention .  [name] history has been reviewed, patient examined, no change in status, stable for surgery.  I have reviewed the patient's chart and labs.  Questions were answered to the patient's satisfaction.     Kateline Kinkade S

## 2012-11-06 NOTE — H&P (View-Only) (Signed)
[facility] [place]  Patient name: Jay Barber MRN: [id number] DOB: 04-28-1980 Sex: male  REASON FOR CONSULT: left foot wound. Referred by the emergency department.  HPI: Jay Barber is a 32 y.o. male dropped a vice on his left foot 7-10 days ago. He was seen in the emergency department. X-ray showed no evidence of fracture. However he had a wound on his fifth toe and he sent for vascular consultation. Prior to this he does admit a long history of left calf claudication. He developed pain in his left calf which is brought on by ambulation and relieved with rest. He denies any history of previous rest pain or previous history of nonhealing ulcers. He does have diabetes and also a 2 pack per day history of smoking since he was 32 years old.  Past Medical History  Diagnosis Date  . Diabetes mellitus without complication    He denies any history of hypertension, hypercholesterolemia, history of previous myocardial infarction or history congestive heart failure.  Family History  Problem Relation Age of Onset  . Heart disease Father    He does have a history of premature cardiovascular disease in the family. His father had coronary disease at age 27.  For VQI Use Only  PRE-ADM LIVING: [facility] ] Home, [ ]  Nursing home, [ ]  Homeless  AMB STATUS: [facility] ] Walking, [ ]  Walking w/ Assistance, [ ]  Wheelchair, [ ] Bed ridden  RECENT HEART ATTACK (<6 mon): No  CAD Sx: [facility] ] No, [ ]  Asx, h/o MI, [ ]  Stable angina, [ ]  Unstable angina  PRIOR CHF: [facility] ] No, [ ]  Asx, [ ]  Mild, [ ]  Moderate, [ ]  Severe  STRESS TEST: [facility] ] No, [ ]  Normal, [ ]  + ischemia, [ ]  + MI, [ ]  Both  SOCIAL HISTORY: History  Substance Use Topics  . Smoking status: Heavy Tobacco Smoker -- 2.00 packs/day for 13 years    Types: Cigarettes  . Smokeless tobacco: Not on file  . Alcohol Use: 10.8 oz/week    18 Cans of beer per week    No Known Allergies  Current Outpatient Prescriptions  Medication Sig  Dispense Refill  . cephALEXin (KEFLEX) 500 MG capsule Take 1 capsule (500 mg total) by mouth 4 (four) times daily.  40 capsule  0  . insulin aspart (NOVOLOG) 100 UNIT/ML injection Inject 2-4 Units into the skin 4 (four) times daily -  before meals and at bedtime. Uses a sliding scale.      . insulin glargine (LANTUS) 100 UNIT/ML injection Inject 15 Units into the skin at bedtime.      Marland Kitchen HYDROcodone-acetaminophen (NORCO/VICODIN) 5-325 MG per tablet Take 1 tablet by mouth every 4 (four) hours as needed for pain.  10 tablet  0   No current facility-administered medications for this visit.   REVIEW OF SYSTEMS: [facility] ] denotes positive finding; [  ] denotes negative finding  CARDIOVASCULAR:  [ ]  chest pain   [ ]  chest pressure   [ ]  palpitations   [ ]  orthopnea   [ ]  dyspnea on exertion   [facility] ] claudication left calf  [ ]  rest pain   [ ]  DVT   [ ]  phlebitis PULMONARY:   [ ]  productive cough   [ ]  asthma   [ ]  wheezing NEUROLOGIC:   [ ]  weakness  [ ]  paresthesias  [ ]  aphasia  [ ]  amaurosis  [ ]  dizziness HEMATOLOGIC:   [ ]  bleeding  problems   [ ]  clotting disorders MUSCULOSKELETAL:  [ ]  joint pain   [ ]  joint swelling [ ]  leg swelling GASTROINTESTINAL: [ ]   blood in stool  [ ]   hematemesis GENITOURINARY:  [ ]   dysuria  [ ]   hematuria PSYCHIATRIC:  [ ]  history of major depression INTEGUMENTARY:  [ ]  rashes  [ ]  ulcers CONSTITUTIONAL:  [ ]  fever   [ ]  chills  PHYSICAL EXAM: Filed Vitals:   11/01/12 1149  BP: 129/77  Pulse: 81  Height: 5\' 11"  (1.803 m)  Weight: 134 lb (60.782 kg)  SpO2: 100%   Body mass index is 18.7 kg/(m^2). GENERAL: The patient is a well-nourished male, in no acute distress. The vital signs are documented above. CARDIOVASCULAR: There is a regular rate and rhythm. I do not detect carotid bruits. He has palpable femoral pulses bilaterally. On the left side, which is the symptomatic side, he does have a palpable popliteal pulse. I cannot palpate pedal pulses. The right side he  has a palpable popliteal pulse and weakly palpable dorsalis pedis and posterior tibial pulses. He has no significant lower extremity swelling. PULMONARY: There is good air exchange bilaterally without wheezing or rales. ABDOMEN: Soft and non-tender with normal pitched bowel sounds. I cannot palpate an aneurysm. MUSCULOSKELETAL: He has dry gangrene of his left fifth toe with some mild cellulitis on the lateral aspect of his foot which he says is improving. NEUROLOGIC: No focal weakness or paresthesias are detected. SKIN: There are no ulcers or rashes noted. PSYCHIATRIC: The patient has a normal affect.  DATA:  I have reviewed his venous duplex scan from [facility] which showed no evidence of DVT in the left lower extremity.  I also reviewed his arterial duplex study and ABIs.  His ABI on the right is 100%. ABI on the left is 26%. He has monophasic Doppler signals in the anterior tibial and posterior tibial positions on the left. On the right side he has triphasic anterior tibial and posterior tibial signals. Duplex of the left lower extremity shows a 50-99% left proximal artery stenosis.  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 In addition he has heavy smoking history and is diabetic. This could become a limb threatening problem. I recommended that we proceed with arteriography. I have reviewed with the patient the indications for arteriography. In addition, I have reviewed the potential complications of arteriography including but not limited to: Bleeding, arterial injury, arterial thrombosis, dye action, renal insufficiency, or other unpredictable medical problems. I have explained to the patient that if we find disease amenable to angioplasty we could potentially address this at the same time. I have discussed the potential complications of angioplasty and  stenting, including but not limited to: Bleeding, arterial thrombosis, arterial injury, dissection, or the need for surgical intervention. His procedure is scheduled for 11/06/2012. We'll make further recommendations pending these results.   in addition, we have had a long discussion about the importance of tobacco cessation. We discussed the fact on atherosclerotic disease and progression of his disease. He understands that given his diabetes smoking puts him at significant risk for limb loss in the future. We also discussed the affected nicotine has on vasospasm in his ability to heal the wound on his left foot. He does seem motivated to try to quit.    DICKSON,CHRISTOPHER S [facility] [place] Beeper: [phone]

## 2012-11-06 NOTE — Op Note (Signed)
NAME: [name]   MRN: [id number] DOB: [birth year]    DATE OF OPERATION: 11/06/2012  PREOP DIAGNOSIS: ulcer left 5th toe with PVD  POSTOP DIAGNOSIS: same  PROCEDURE:  1. Ultrasound-guided access to the right common femoral artery 2. Aortogram with bilateral iliac arteriogram and bilateral lower extremity runoff  SURGEON: Di Kindle. Edilia Bo, MD, FACS  ASSIST: none  ANESTHESIA: local with sedation   EBL: minimal  INDICATIONS: Jay Barber is a 32 y.o. male who has a history of diabetes and is also a 2 pack per day smoker. He dropped a device on his left foot and sustained a injury to his left fifth toe. Duplex suggested disease of his popliteal artery and he is brought in for arteriography and possible intervention.  TECHNIQUE: The patient was taken to the peripheral vascular lab and received 1 mg of Versed and 50 mcg of fentanyl. Both groins were prepped and draped in usual sterile fashion. After the skin was infiltrated with 1% lidocaine, and under ultrasound guidance, the right common femoral artery was cannulated and a guidewire introduced into the infrarenal aorta under fluoroscopic control. A 5 French sheath was introduced over the wire. A pigtail catheter was positioned at the L1 vertebral body and flush aortogram obtained. The catheter was positioned above the aortic bifurcation and an oblique iliac protection was obtained. Next bilateral lower extremity runoff films were obtained. At the completion of the procedure the patient was transferred to the holding for removal of the sheath. No immediate complications were noted.  FINDINGS:  1. There are single renal arteries bilaterally with no significant renal artery stenosis identified. 2. The infrarenal aorta is widely patent as are bilateral common iliac arteries, hypogastric arteries, and external iliac arteries. 3. On the right side, the common femoral, superficial femoral, and deep femoral artery patent. There is mild  disease at the adductor canal on the right and a moderate stenosis of the popliteal artery behind the knee. There is three-vessel runoff on the right via the anterior tibial, posterior tibial, and peroneal arteries. 4. On the left side, which is the symptomatic side, the common femoral and proximal superficial femoral artery patent. The deep femoral artery is patent. There is diffuse calcific disease of the above-knee popliteal artery and in the popliteal artery is occluded at the level of the knee. There is reconstitution of the anterior tibial and peroneal arteries on the left. The posterior tibial artery is occluded.  Waverly Ferrari, MD, FACS [facility]  DATE OF DICTATION:   11/06/2012

## 2012-11-06 NOTE — Telephone Encounter (Addendum)
Message copied by Rosalyn Charters on Mon Nov 06, 2012  1:53 PM ------      Message from: Melene Plan      Created: Mon Nov 06, 2012 12:32 PM      Regarding: FW: charge and F/U                   ----- Message -----         From: Chuck Hint, MD         Sent: 11/06/2012  11:42 AM           To: Reuel Derby, Melene Plan, RN, #      Subject: charge and F/U                                           PROCEDURE:       1. Ultrasound-guided access to the right common femoral artery      2. Aortogram with bilateral iliac arteriogram and bilateral lower extremity runoff            SURGEON: Di Kindle. Edilia Bo, MD, FACS            He needs a follow up visit in 4 weeks to check on his left fifth toe wound. Thank you. CD ------  l/v/m for patient notifying him of a fu appt. with dr. Edilia Bo on 12-06-12 at 9a.m.

## 2012-11-09 ENCOUNTER — Telehealth: Payer: Self-pay

## 2012-11-09 NOTE — Telephone Encounter (Signed)
Phone call from pt.  States the "underneathe side of my penis and scrotum are red, and the skin is peeling a little."  Denies swelling or bruising or tenderness of the right groin, or in the genital area.  States the (R) groin area looks fine.  Pt. stated that there had been tape used in the area of his genitals, at time of procedure, but denied any tape lines of red/ irritated skin.   Questioned about changing his laundry detergent.  Pt. agreed that he recently changed detergent.  Advised to switch to a detergent free of perfumes or dyes.  Advised to take an oatmeal bath to soothe the irritated skin.  Recommended he could try Benadryl over the counter, if the skin is itching or rashy.  Advised to avoid driving after taking any Benadryl.  Denies any rash.  Stated it just looks red.  Pt. verb. understanding of instructions.

## 2012-11-13 ENCOUNTER — Encounter: Payer: Self-pay | Admitting: Vascular Surgery

## 2012-11-20 ENCOUNTER — Telehealth: Payer: Self-pay

## 2012-11-20 NOTE — Telephone Encounter (Signed)
I spoke with Jay Barber to schedule for 09/24 @ 3:15- he is aware that he will be worked in and may have to wait.

## 2012-11-20 NOTE — Telephone Encounter (Addendum)
Pt. reported has had increase in a yellow/ brown draninage with an odor from left 5th toe, over past week.  Denies redness/ warmth.  Reports increase in pain of the ulcerated area left 5th toe.  Asking to have an antibiotic phoned-in; states "I think it might be getting infected."  Advised will need to evaluate wound and drainage before an antibiotic can be ordered.  Advised will have a scheduler call pt. with an appt. to evaluate left 5th toe ulcer, sooner than his f/u on 12/06/12.

## 2012-11-21 ENCOUNTER — Encounter: Payer: Self-pay | Admitting: Vascular Surgery

## 2012-11-22 ENCOUNTER — Ambulatory Visit (INDEPENDENT_AMBULATORY_CARE_PROVIDER_SITE_OTHER): Payer: Self-pay | Admitting: Vascular Surgery

## 2012-11-22 ENCOUNTER — Encounter: Payer: Self-pay | Admitting: Vascular Surgery

## 2012-11-22 VITALS — BP 125/77 | HR 87 | Resp 18 | Ht 71.0 in | Wt 136.0 lb

## 2012-11-22 DIAGNOSIS — Z01810 Encounter for preprocedural cardiovascular examination: Secondary | ICD-10-CM

## 2012-11-22 DIAGNOSIS — I739 Peripheral vascular disease, unspecified: Secondary | ICD-10-CM

## 2012-11-22 MED ORDER — CEPHALEXIN 500 MG PO CAPS: 500.0000 mg | ORAL_CAPSULE | Freq: Four times a day (QID) | ORAL | Status: DC

## 2012-11-22 NOTE — Progress Notes (Signed)
[facility] [place]  Date of Surgery: 11/06/12  1. Ultrasound-guided access to the right common femoral artery  2. Aortogram with bilateral iliac arteriogram and bilateral lower extremity runoff   Surgeon: CS Edilia Bo, Md   History of Present Illness  Jay Barber is a 32 y.o. male who has been a diabetic for 12 years. He was seen by Dr Edilia Bo for nonhealing wound on his left 5th toe after dropping a vise grip on the toe.  He had been on Keflex and the toe was healing nicely until Sunday evening when he noted  Worsening swelling and yellow exudate forming on the toe. He denies any night or rest pain, he denies increased claudication in the left calf.  Pt is trying to stop smoking and has decreased his cigarettes by half.  He had an angiogram on 11/06/12 which revealed 3. On the right side, the common femoral, superficial femoral, and deep femoral artery patent. There is mild disease at the adductor canal on the right and a moderate stenosis of the popliteal artery behind the knee. There is three-vessel runoff on the right via the anterior tibial, posterior tibial, and peroneal arteries.  4. On the left side, which is the symptomatic side, the common femoral and proximal superficial femoral artery patent. The deep femoral artery is patent. There is diffuse calcific disease of the above-knee popliteal artery and in the popliteal artery is occluded at the level of the knee. There is reconstitution of the anterior tibial and peroneal arteries on the left. The posterior tibial artery is occluded.  VASC. LAB Studies:        ABI: Right 100%;  Left 26%;  Physical Examination  BP Readings from Last 3 Encounters:  11/22/12 125/77  11/06/12 140/95  11/06/12 140/95   Temp Readings from Last 3 Encounters:  11/06/12 98.1 F (36.7 C) Oral  11/06/12 98.1 F (36.7 C) Oral  10/27/12 97.8 F (36.6 C) Oral   SpO2 Readings from Last 3 Encounters:  11/06/12 82%  11/06/12 82%   11/01/12 100%   Pulse Readings from Last 3 Encounters:  11/22/12 87  11/06/12 100  11/06/12 100    Pt is A&O x 3 Gait is normal BLE warm Large callous on base of right great toe Left foot with cellulitis and min swelling 5th toe to metatarsal 5th toe with some granulation tissue and exudative tissue. No ulcers between toes Left Dorsalis Pedis pulse is monophasic by Doppler Left Posterior tibial pulse is absent   Assessment/Plan: Jay Barber is a 32 y.o. male with hx DM and smoking who has a non healing ulcer on the left 5th toe Per aortogram he has distal popliteal occlusion and reconstitution of Peroneal and AT arteries. We will re-order keflex and get vein mapping/TBI on left at next visit in October. If this wound does not heal he may need pop to distal bypass with vein  Clinic MD: CSD   Rhonna Holster J  11/22/2012 4:47 PM  Agree with above. Hopefully the toe wound will continue to gradually heal. If not he will need to be considered for a popliteal to anterior tibial artery bypass. I plan on seeing him back in 2 weeks. He knows to call sooner if he has problems.  Waverly Ferrari, MD, FACS Beeper [phone] 11/22/2012

## 2012-11-27 NOTE — Addendum Note (Signed)
Addended by: Sharee Pimple on: 11/27/2012 02:33 PM   Modules accepted: Orders

## 2012-12-05 ENCOUNTER — Encounter: Payer: Self-pay | Admitting: Vascular Surgery

## 2012-12-06 ENCOUNTER — Ambulatory Visit (INDEPENDENT_AMBULATORY_CARE_PROVIDER_SITE_OTHER): Payer: Self-pay | Admitting: Vascular Surgery

## 2012-12-06 ENCOUNTER — Encounter: Payer: Self-pay | Admitting: Vascular Surgery

## 2012-12-06 ENCOUNTER — Ambulatory Visit (HOSPITAL_COMMUNITY)
Admission: RE | Admit: 2012-12-06 | Discharge: 2012-12-06 | Disposition: A | Discharge status: Home / Self Care | Payer: Self-pay | Source: Ambulatory Visit | Attending: Vascular Surgery | Admitting: Vascular Surgery

## 2012-12-06 ENCOUNTER — Ambulatory Visit: Payer: Self-pay | Admitting: Vascular Surgery

## 2012-12-06 VITALS — BP 133/83 | HR 73 | Ht 71.0 in | Wt 138.9 lb

## 2012-12-06 DIAGNOSIS — I739 Peripheral vascular disease, unspecified: Secondary | ICD-10-CM

## 2012-12-06 DIAGNOSIS — Z01810 Encounter for preprocedural cardiovascular examination: Secondary | ICD-10-CM

## 2012-12-06 NOTE — Progress Notes (Signed)
Patient name: Jay Barber MRN: [id number] DOB: [birth year] Sex: male  REASON FOR VISIT: follow up of left fifth toe wound.  HPI: JAHKI WITHAM is a 32 y.o. male who had dropped a vice on his left fifth toe and sustained an injury. He was last seen in our office on 11/22/2012. He had undergone an arteriogram on 11/06/2012 which showed on the left side, the common femoral artery and proximal superficial femoral artery were patent. There was diffuse calcific disease of the above-knee pop 2 artery and the popliteal artery was occluded at the level of the knee. With reconstitution of the anterior tibial and peroneal artery on the left. The posterior tibial artery was occluded. If the toe did not heal the feeling was that he would require a popliteal to anterior tibial artery bypass for limb salvage. He comes in for a routine follow up visit.   He has almost completely quit smoking. He is down to 1-2 cigarettes a day. He states that the toe was gotten much better. There has been no significant drainage. He denies fever.   REVIEW OF SYSTEMS: Arly.Keller ] denotes positive finding; [  ] denotes negative finding  CARDIOVASCULAR:  [ ]  chest pain   [ ]  dyspnea on exertion    CONSTITUTIONAL:  [ ]  fever   [ ]  chills  PHYSICAL EXAM: Filed Vitals:   12/06/12 1058  BP: 133/83  Pulse: 73  Height: 5\' 11"  (1.803 m)  Weight: 138 lb 14.4 oz (63.005 kg)  SpO2: 100%   Body mass index is 19.38 kg/(m^2). GENERAL: The patient is a well-nourished male, in no acute distress. The vital signs are documented above. CARDIOVASCULAR: There is a regular rate and rhythm  PULMONARY: There is good air exchange bilaterally without wheezing or rales. A palpable femoral pulse on the left. I cannot palpate pedal pulses. The left fifth toe wound does look better and there's a reasonable granulation tissue which is developed. There is no significant erythema or drainage currently to  MEDICAL ISSUES: He would like to continue with  conservative treatment for now I think this is reasonable given the toe does appear to be improving. I'll see him back in 4-5 weeks. We have again discussed the importance of tobacco cessation. It toe does not continue to improve and he would have to be considered for a superficial femoral artery to anterior tibial artery bypass.  Jonah Gingras S [facility] Beeper: [phone]

## 2013-01-03 ENCOUNTER — Ambulatory Visit: Payer: Self-pay | Admitting: Vascular Surgery

## 2013-01-04 ENCOUNTER — Other Ambulatory Visit: Payer: Self-pay

## 2013-01-16 ENCOUNTER — Encounter: Payer: Self-pay | Admitting: Vascular Surgery

## 2013-01-17 ENCOUNTER — Ambulatory Visit: Payer: Self-pay | Admitting: Vascular Surgery

## 2013-02-06 ENCOUNTER — Encounter: Payer: Self-pay | Admitting: Vascular Surgery

## 2013-02-07 ENCOUNTER — Ambulatory Visit: Payer: Self-pay | Admitting: Vascular Surgery

## 2013-03-06 ENCOUNTER — Encounter: Payer: Self-pay | Admitting: Vascular Surgery

## 2013-03-07 ENCOUNTER — Ambulatory Visit: Payer: Self-pay | Admitting: Vascular Surgery

## 2013-03-14 ENCOUNTER — Encounter: Payer: Self-pay | Admitting: Vascular Surgery

## 2013-03-14 ENCOUNTER — Ambulatory Visit (INDEPENDENT_AMBULATORY_CARE_PROVIDER_SITE_OTHER): Payer: Self-pay | Admitting: Vascular Surgery

## 2013-03-14 VITALS — BP 125/75 | HR 82 | Ht 71.0 in | Wt 136.4 lb

## 2013-03-14 DIAGNOSIS — I739 Peripheral vascular disease, unspecified: Secondary | ICD-10-CM

## 2013-03-14 DIAGNOSIS — I7025 Atherosclerosis of native arteries of other extremities with ulceration: Secondary | ICD-10-CM | POA: Insufficient documentation

## 2013-03-14 DIAGNOSIS — L98499 Non-pressure chronic ulcer of skin of other sites with unspecified severity: Principal | ICD-10-CM

## 2013-03-14 NOTE — Assessment & Plan Note (Signed)
The wounds on his left foot appear to be gradually improving. We again discussed the importance of tobacco cessation. He understands that the nicotine can cause vasospasm of the microcirculation lasting up to 12 hours. Therefore, even 2 cigarettes a day will significantly reduce his chance of healing these wounds. He continues to work on trying to quit. I've also encouraged him to soak his foot daily and lukewarm.also soaks to keep the wound dry. I'll see him back in 2 months. If the wound failed to heal and he would require femoral to peroneal artery bypass grafting.

## 2013-03-14 NOTE — Addendum Note (Signed)
Addended by: Sharee PimpleMCCHESNEY, Buckley Bradly K on: 03/14/2013 04:14 PM   Modules accepted: Orders

## 2013-03-14 NOTE — Progress Notes (Signed)
   Patient name: Jay Barber MRN: [id number] DOB: 04-May-1980 Sex: male  REASON FOR VISIT: Follow up of left fifth toe wound.  HPI: Jay Barber is a 33 y.o. male who injured his left fifth toe. He was seen in September of 2014. He underwent an arteriogram on 11/06/2012 which showed the common femoral and proximal superficial femoral artery were patent. There was diffuse calcific disease of the above-knee popliteal artery and the popliteal artery was occluded at the level of the knee. There was reconstitution of the peroneal artery on the left. The posterior tibial artery was occluded. It appeared that his best option for revascularization would be a popliteal to peroneal artery bypass. He has been reluctant to consider surgery and we have been following his wound. He comes in for a routine follow up visit.  Since I saw him last, he has cut back but does continue to smoke. He states that the wound on his left foot have been gradually improving. He denies fever or chills.  REVIEW OF SYSTEMS: Arly.Keller[X ] denotes positive finding; [  ] denotes negative finding  CARDIOVASCULAR:  [ ]  chest pain   [ ]  dyspnea on exertion    CONSTITUTIONAL:  [ ]  fever   [ ]  chills  PHYSICAL EXAM: Filed Vitals:   03/14/13 1413  BP: 125/75  Pulse: 82  Height: 5\' 11"  (1.803 m)  Weight: 136 lb 6.4 oz (61.871 kg)  SpO2: 100%   Body mass index is 19.03 kg/(m^2). GENERAL: The patient is a well-nourished male, in no acute distress. The vital signs are documented above. CARDIOVASCULAR: There is a regular rate and rhythm. He has palpable femoral pulses. PULMONARY: There is good air exchange bilaterally without wheezing or rales. He has a peroneal signal posterior to his lateral malleolus on the left. There is a wound on the lateral aspect of his left great toe and the medial aspect of his left fifth toe. These wounds are improving.  MEDICAL ISSUES:  Atherosclerosis of native arteries of the extremities with  ulceration(440.23) The wounds on his left foot appear to be gradually improving. We again discussed the importance of tobacco cessation. He understands that the nicotine can cause vasospasm of the microcirculation lasting up to 12 hours. Therefore, even 2 cigarettes a day will significantly reduce his chance of healing these wounds. He continues to work on trying to quit. I've also encouraged him to soak his foot daily and lukewarm.also soaks to keep the wound dry. I'll see him back in 2 months. If the wound failed to heal and he would require femoral to peroneal artery bypass grafting.   DICKSON,CHRISTOPHER S [facility] Beeper: [phone]

## 2013-04-14 ENCOUNTER — Emergency Department (HOSPITAL_COMMUNITY)
Admission: EM | Admit: 2013-04-14 | Discharge: 2013-04-15 | Disposition: A | Discharge status: Home / Self Care | Payer: Self-pay | Attending: Emergency Medicine | Admitting: Emergency Medicine

## 2013-04-14 ENCOUNTER — Encounter (HOSPITAL_COMMUNITY): Payer: Self-pay | Admitting: Emergency Medicine

## 2013-04-14 DIAGNOSIS — E119 Type 2 diabetes mellitus without complications: Secondary | ICD-10-CM | POA: Insufficient documentation

## 2013-04-14 DIAGNOSIS — IMO0001 Reserved for inherently not codable concepts without codable children: Secondary | ICD-10-CM

## 2013-04-14 DIAGNOSIS — L03031 Cellulitis of right toe: Secondary | ICD-10-CM

## 2013-04-14 DIAGNOSIS — L03119 Cellulitis of unspecified part of limb: Secondary | ICD-10-CM

## 2013-04-14 DIAGNOSIS — F17200 Nicotine dependence, unspecified, uncomplicated: Secondary | ICD-10-CM | POA: Insufficient documentation

## 2013-04-14 DIAGNOSIS — I739 Peripheral vascular disease, unspecified: Secondary | ICD-10-CM | POA: Insufficient documentation

## 2013-04-14 DIAGNOSIS — Z794 Long term (current) use of insulin: Secondary | ICD-10-CM | POA: Insufficient documentation

## 2013-04-14 DIAGNOSIS — L02619 Cutaneous abscess of unspecified foot: Secondary | ICD-10-CM | POA: Insufficient documentation

## 2013-04-14 LAB — CBC WITH DIFFERENTIAL/PLATELET
BASOS ABS: 0.1 10*3/uL (ref 0.0–0.1)
Basophils Relative: 1 % (ref 0–1)
Eosinophils Absolute: 0 10*3/uL (ref 0.0–0.7)
Eosinophils Relative: 1 % (ref 0–5)
HCT: 37.4 % — ABNORMAL LOW (ref 39.0–52.0)
Hemoglobin: 13.3 g/dL (ref 13.0–17.0)
LYMPHS ABS: 1.1 10*3/uL (ref 0.7–4.0)
Lymphocytes Relative: 14 % (ref 12–46)
MCH: 32.4 pg (ref 26.0–34.0)
MCHC: 35.6 g/dL (ref 30.0–36.0)
MCV: 91.2 fL (ref 78.0–100.0)
Monocytes Absolute: 1 10*3/uL (ref 0.1–1.0)
Monocytes Relative: 12 % (ref 3–12)
NEUTROS PCT: 73 % (ref 43–77)
Neutro Abs: 6 10*3/uL (ref 1.7–7.7)
PLATELETS: 213 10*3/uL (ref 150–400)
RBC: 4.1 MIL/uL — AB (ref 4.22–5.81)
RDW: 12.3 % (ref 11.5–15.5)
WBC: 8.2 10*3/uL (ref 4.0–10.5)

## 2013-04-14 LAB — URINALYSIS, ROUTINE W REFLEX MICROSCOPIC
Bilirubin Urine: NEGATIVE
Glucose, UA: >1000 mg/dL — AB
Ketones, ur: 15 mg/dL — AB
LEUKOCYTES UA: NEGATIVE
NITRITE: NEGATIVE
PH: 6 (ref 5.0–8.0)
PROTEIN: NEGATIVE mg/dL
Specific Gravity, Urine: 1.025 (ref 1.005–1.030)
Urobilinogen, UA: 0.2 mg/dL (ref 0.0–1.0)

## 2013-04-14 LAB — POCT I-STAT, CHEM 8
BUN: 9 mg/dL (ref 6–23)
CALCIUM ION: 1.12 mmol/L (ref 1.12–1.23)
Chloride: 92 mEq/L — ABNORMAL LOW (ref 96–112)
Creatinine, Ser: 0.8 mg/dL (ref 0.50–1.35)
Glucose, Bld: 349 mg/dL — ABNORMAL HIGH (ref 70–99)
HEMATOCRIT: 42 % (ref 39.0–52.0)
Hemoglobin: 14.3 g/dL (ref 13.0–17.0)
Potassium: 3.7 mEq/L (ref 3.7–5.3)
Sodium: 131 mEq/L — ABNORMAL LOW (ref 137–147)
TCO2: 25 mmol/L (ref 0–100)

## 2013-04-14 LAB — URINE MICROSCOPIC-ADD ON

## 2013-04-14 LAB — GLUCOSE, CAPILLARY
GLUCOSE-CAPILLARY: 355 mg/dL — AB (ref 70–99)
GLUCOSE-CAPILLARY: 359 mg/dL — AB (ref 70–99)

## 2013-04-14 MED ORDER — OXYCODONE-ACETAMINOPHEN 5-325 MG PO TABS
1.0000 | ORAL_TABLET | Freq: Once | ORAL | Status: AC
  Administered 2013-04-14: 1 via ORAL
  Filled 2013-04-14: qty 1

## 2013-04-14 MED ORDER — CLINDAMYCIN PHOSPHATE 900 MG/50ML IV SOLN
900.0000 mg | Freq: Once | INTRAVENOUS | Status: AC
  Administered 2013-04-14: 900 mg via INTRAVENOUS
  Filled 2013-04-14: qty 50

## 2013-04-14 MED ORDER — IBUPROFEN 200 MG PO TABS
400.0000 mg | ORAL_TABLET | Freq: Once | ORAL | Status: AC
  Administered 2013-04-14: 400 mg via ORAL
  Filled 2013-04-14: qty 2

## 2013-04-14 NOTE — ED Notes (Addendum)
C/o "feeling bad", onset 4-5d ago, also reports foot ulcer getting worse on R foot. BS elevated (355) in triage. Pt reports "BS is coming down, took 4 units of novolog PTA). "Some foot pain d/t swelling". (Denies: nvd, fever, dizziness, sob or other sx).

## 2013-04-14 NOTE — ED Provider Notes (Signed)
CSN: [id number]     Arrival date & time 04/14/13  2110 History   First MD Initiated Contact with Patient 04/14/13 2241     Chief Complaint  Patient presents with  . Hyperglycemia  . Foot Ulcer     (Consider location/radiation/quality/duration/timing/severity/associated sxs/prior Treatment) Patient is a 33 y.o. male presenting with hyperglycemia. The history is provided by the patient and medical records. No language interpreter was used.  Hyperglycemia Associated symptoms: no abdominal pain, no chest pain, no diaphoresis, no dysuria, no fatigue, no fever, no increased thirst, no nausea, no polyuria, no shortness of breath and no vomiting     Jay Barber is a 33 y.o. male  with a hx of diabetes, peripheral vascular disease presents to the Emergency Department complaining of gradual, persistent, progressively worsening swelling and erythema of the right great toe onset 3 days ago. He reports his blood sugars have been running high for the last several days, but reports it was much higher today.  Associated symptoms include general malaise and feeling "unwell."  Patient reports swelling in his foot is painful the lesion on his toe is not.  He states he's seen several physicians about the ulcer on his foot and was told that it was a pressure sore. He reports that he's kept all of his followup appointments but the wound has not looked the way it does today. He reports a drain serous fluid only. Nothing makes it better or worse. He denies fever, chills, headache neck pain chest pain, shortness of breath, abdominal pain, nausea, vomiting, diarrhea weakness dizziness, syncope.   Past Medical History  Diagnosis Date  . Diabetes mellitus without complication   . Peripheral vascular disease    History reviewed. No pertinent past surgical history. Family History  Problem Relation Age of Onset  . Heart disease Father   . Hypertension Mother   . Depression Mother   . GER disease Mother     History  Substance Use Topics  . Smoking status: Light Tobacco Smoker -- 0.25 packs/day for 13 years    Types: Cigarettes  . Smokeless tobacco: Never Used  . Alcohol Use: 10.8 oz/week    18 Cans of beer per week    Review of Systems  Constitutional: Negative for fever, diaphoresis, appetite change, fatigue and unexpected weight change.  HENT: Negative for mouth sores.   Eyes: Negative for visual disturbance.  Respiratory: Negative for cough, chest tightness, shortness of breath and wheezing.   Cardiovascular: Negative for chest pain.  Gastrointestinal: Negative for nausea, vomiting, abdominal pain, diarrhea and constipation.  Endocrine: Negative for polydipsia, polyphagia and polyuria.  Genitourinary: Negative for dysuria, urgency, frequency and hematuria.  Musculoskeletal: Positive for arthralgias and gait problem (2/2 pain). Negative for back pain and neck stiffness.  Skin: Positive for color change and wound. Negative for rash.  Allergic/Immunologic: Negative for immunocompromised state.  Neurological: Negative for syncope, light-headedness and headaches.  Hematological: Does not bruise/bleed easily.  Psychiatric/Behavioral: Negative for sleep disturbance. The patient is not nervous/anxious.       Allergies  Review of patient's allergies indicates no known allergies.  Home Medications   Current Outpatient Rx  Name  Route  Sig  Dispense  Refill  . insulin aspart (NOVOLOG) 100 UNIT/ML injection   Subcutaneous   Inject 2-4 Units into the skin 4 (four) times daily -  before meals and at bedtime. Uses a sliding scale.         . insulin glargine (LANTUS) 100 UNIT/ML injection  Subcutaneous   Inject 15 Units into the skin at bedtime.         . clindamycin (CLEOCIN) 150 MG capsule   Oral   Take 3 capsules (450 mg total) by mouth 3 (three) times daily.   90 capsule   0   . oxyCODONE-acetaminophen (PERCOCET/ROXICET) 5-325 MG per tablet   Oral   Take 1-2 tablets by  mouth every 4 (four) hours as needed for severe pain.   15 tablet   0    BP 139/75  Pulse 84  Temp(Src) 98.5 F (36.9 C) (Oral)  Resp 18  Ht 5\' 9"  (1.753 m)  Wt 136 lb (61.689 kg)  BMI 20.07 kg/m2  SpO2 99% Physical Exam  Nursing note and vitals reviewed. Constitutional: He is oriented to person, place, and time. He appears well-developed and well-nourished. No distress.  HENT:  Head: Normocephalic and atraumatic.  Eyes: Conjunctivae are normal. No scleral icterus.  Neck: Normal range of motion.  Cardiovascular: Normal rate, regular rhythm, normal heart sounds and intact distal pulses.   No murmur heard. Capillary refill is less than 3 seconds  Pulmonary/Chest: Effort normal and breath sounds normal.  Clear and equal breath sounds  Abdominal: Soft. He exhibits no distension. There is no tenderness.  Soft and nontender  Musculoskeletal: He exhibits tenderness. He exhibits no edema.  ROM: Full range of motion of all toes except the great toe on the right foot and full range of motion of the right ankle Nonpitting edema noted of the right ankle; no edema of the left ankle Right great toe is swollen significantly with pressure callus and an open sore on the medial portion. Erythema of the entire toe  Lymphadenopathy:    He has no cervical adenopathy.  Neurological: He is alert and oriented to person, place, and time. Coordination normal.  Sensation intact Strength 5 out of 5  Skin: Skin is warm and dry. He is not diaphoretic. There is erythema.  No tenting of the skin  Psychiatric: He has a normal mood and affect.    ED Course  Procedures (including critical care time) Labs Review Labs Reviewed  GLUCOSE, CAPILLARY - Abnormal; Notable for the following:    Glucose-Capillary 355 (*)    All other components within normal limits  CBC WITH DIFFERENTIAL - Abnormal; Notable for the following:    RBC 4.10 (*)    HCT 37.4 (*)    All other components within normal limits   URINALYSIS, ROUTINE W REFLEX MICROSCOPIC - Abnormal; Notable for the following:    Glucose, UA >1000 (*)    Hgb urine dipstick SMALL (*)    Ketones, ur 15 (*)    All other components within normal limits  GLUCOSE, CAPILLARY - Abnormal; Notable for the following:    Glucose-Capillary 359 (*)    All other components within normal limits  POCT I-STAT, CHEM 8 - Abnormal; Notable for the following:    Sodium 131 (*)    Chloride 92 (*)    Glucose, Bld 349 (*)    All other components within normal limits  URINE MICROSCOPIC-ADD ON   Imaging Review No results found.  EKG Interpretation   None       MDM   Final diagnoses:  IDDM (insulin dependent diabetes mellitus)  Cellulitis of great toe of right foot    Jay Barber presents with swelling and erythema of the right great toe. The patient also with hyperglycemia. AG 14.  No leukocytosis.  Patient took  4 units of NovoLog prior to arrival and reports that his blood sugar came down significantly.  He denies fever, chills or other systemic symptoms.  X-ray pending for evaluation of possible osteomyelitis.  12:58 AM Pt with minimal amount of ketones in urine.  Pt has received Clindamycin IV.  He was evaluated by Dr. Preston Fleeting who agrees that an outpatient trial is reasonable as he is afebrile without a leukocytosis.  Pt ambulates without difficulty.    1:10 AM Pt CBG remains about the same will give Novolog 5units SQ.  X-ray without without evidence of underlying osseous abnormality.  I personally reviewed the imaging tests through PACS system.  I reviewed available ER/hospitalization records through the EMR.  Will d/c home with percocet and clindamycin.  Pt has been given strict instructions to f/u with PCP in 48 hours or return here to the ED.    It has been determined that no acute conditions requiring further emergency intervention are present at this time. The patient/guardian have been advised of the diagnosis and plan. We have  discussed signs and symptoms that warrant return to the ED, such as changes or worsening in symptoms.   Vital signs are stable at discharge.   BP 140/79  Pulse 74  Temp(Src) 98.5 F (36.9 C) (Oral)  Resp 18  Ht 5\' 9"  (1.753 m)  Wt 136 lb (61.689 kg)  BMI 20.07 kg/m2  SpO2 100%  Patient/guardian has voiced understanding and agreed to follow-up with the PCP or specialist.           Dierdre Forth, PA-C 04/15/13 0112  Dione Booze, MD 03/22/14 2303

## 2013-04-15 ENCOUNTER — Emergency Department (HOSPITAL_COMMUNITY): Payer: Self-pay

## 2013-04-15 LAB — GLUCOSE, CAPILLARY
Glucose-Capillary: 360 mg/dL — ABNORMAL HIGH (ref 70–99)
Glucose-Capillary: 359 mg/dL — ABNORMAL HIGH (ref 70–99)

## 2013-04-15 MED ORDER — INSULIN ASPART 100 UNIT/ML ~~LOC~~ SOLN
5.0000 [IU] | Freq: Once | SUBCUTANEOUS | Status: AC
  Administered 2013-04-15: 5 [IU] via SUBCUTANEOUS

## 2013-04-15 MED ORDER — CLINDAMYCIN HCL 150 MG PO CAPS: 450.0000 mg | ORAL_CAPSULE | Freq: Three times a day (TID) | ORAL | Status: DC

## 2013-04-15 MED ORDER — OXYCODONE-ACETAMINOPHEN 5-325 MG PO TABS: 1.0000 | ORAL_TABLET | ORAL | Status: DC | PRN

## 2013-04-15 MED ORDER — INSULIN ASPART 100 UNIT/ML ~~LOC~~ SOLN: 5.0000 [IU] | Freq: Once | SUBCUTANEOUS | Status: DC

## 2013-04-15 NOTE — ED Provider Notes (Signed)
33 year old male diabetic has had a callus on his right first toe for the last 6 months. Over last week, it has become red and swollen. On exam, there is an ulceration on the plantar medial aspect of the right first toe. The toe is erythematous and mildly to moderately swollen and slightly warm to touch. There are no lymphangitic streaks are present and there is no crepitus. WBC is normal. He clearly has a cellulitis and will need antibiotics. However, he does not appear toxic and it is reasonable to give him a trial of outpatient antibiotics and see if he will respond. He is discharged with instructions to followup with his PCP in 2 days and return to the ED if he is not able to get in to see us PCP or symptoms are worsening.  Medical screening examination/treatment/procedure(s) were conducted as a shared visit with non-physician practitioner(s) and myself.  I personally evaluated the patient during the encounter.   Dione Boozeavid Maurisha Mongeau, MD 04/15/13 Jorje Guild0005

## 2013-04-15 NOTE — ED Notes (Signed)
Pt returned from xray

## 2013-04-15 NOTE — Discharge Instructions (Signed)
1. Medications: clindamycin, percocet, usual home medications 2. Treatment: rest, drink plenty of fluids,  3. Follow Up: Please followup with your primary doctor for discussion of your diagnoses and further evaluation after today's visit;     Cellulitis Cellulitis is an infection of the skin and the tissue beneath it. The infected area is usually red and tender. Cellulitis occurs most often in the arms and lower legs.  CAUSES  Cellulitis is caused by bacteria that enter the skin through cracks or cuts in the skin. The most common types of bacteria that cause cellulitis are Staphylococcus and Streptococcus. SYMPTOMS   Redness and warmth.  Swelling.  Tenderness or pain.  Fever. DIAGNOSIS  Your caregiver can usually determine what is wrong based on a physical exam. Blood tests may also be done. TREATMENT  Treatment usually involves taking an antibiotic medicine. HOME CARE INSTRUCTIONS   Take your antibiotics as directed. Finish them even if you start to feel better.  Keep the infected arm or leg elevated to reduce swelling.  Apply a warm cloth to the affected area up to 4 times per day to relieve pain.  Only take over-the-counter or prescription medicines for pain, discomfort, or fever as directed by your caregiver.  Keep all follow-up appointments as directed by your caregiver. SEEK MEDICAL CARE IF:   You notice red streaks coming from the infected area.  Your red area gets larger or turns dark in color.  Your bone or joint underneath the infected area becomes painful after the skin has healed.  Your infection returns in the same area or another area.  You notice a swollen bump in the infected area.  You develop new symptoms. SEEK IMMEDIATE MEDICAL CARE IF:   You have a fever.  You feel very sleepy.  You develop vomiting or diarrhea.  You have a general ill feeling (malaise) with muscle aches and pains. MAKE SURE YOU:   Understand these instructions.  Will  watch your condition.  Will get help right away if you are not doing well or get worse. Document Released: 11/25/2004 Document Revised: 08/17/2011 Document Reviewed: 05/03/2011 [facility] Patient Information 2014 [employer] [place], [place].

## 2013-04-23 ENCOUNTER — Telehealth: Payer: Self-pay | Admitting: *Deleted

## 2013-04-23 NOTE — Telephone Encounter (Signed)
Jay SermonSusan Steil (Mckenzie's mother) states his right great toe is infected and very painful.  She states he was seen in the ER on 04-14-2013 and was put on course of antibiotics.  States he saw his PCP as instructed by ER on 04-16-2013 and again on 04-20-2013.  [name] states Barbara CowerJason is experiencing swelling from his left calf to his foot.  Appointments made on 04-25-2013 at 10:00Am for ABIs and at 10:30AM to see Dr. Edilia Boickson.  [name] will notify her son [name] of these appointments and verbalized relief.

## 2013-04-24 ENCOUNTER — Encounter: Payer: Self-pay | Admitting: Vascular Surgery

## 2013-04-25 ENCOUNTER — Ambulatory Visit (INDEPENDENT_AMBULATORY_CARE_PROVIDER_SITE_OTHER): Payer: Self-pay | Admitting: Vascular Surgery

## 2013-04-25 ENCOUNTER — Telehealth: Payer: Self-pay | Admitting: Vascular Surgery

## 2013-04-25 ENCOUNTER — Ambulatory Visit (HOSPITAL_COMMUNITY)
Admission: RE | Admit: 2013-04-25 | Discharge: 2013-04-25 | Disposition: A | Discharge status: Home / Self Care | Payer: Self-pay | Source: Ambulatory Visit | Attending: Vascular Surgery | Admitting: Vascular Surgery

## 2013-04-25 ENCOUNTER — Encounter: Payer: Self-pay | Admitting: Vascular Surgery

## 2013-04-25 ENCOUNTER — Other Ambulatory Visit: Payer: Self-pay

## 2013-04-25 VITALS — BP 132/72 | HR 82 | Temp 99.5°F | Ht 69.0 in | Wt 142.5 lb

## 2013-04-25 DIAGNOSIS — L98499 Non-pressure chronic ulcer of skin of other sites with unspecified severity: Principal | ICD-10-CM | POA: Insufficient documentation

## 2013-04-25 DIAGNOSIS — I739 Peripheral vascular disease, unspecified: Secondary | ICD-10-CM | POA: Insufficient documentation

## 2013-04-25 NOTE — Telephone Encounter (Signed)
Called pt to give him referral appointment info for Dr. Lajoyce Cornersuda - appt will be 05/01/13 at 2:15 pm. Pt was informed since he does not have insurance that there will be a $250. copay to be collected when he arrived. Pt verbalized understanding.

## 2013-04-25 NOTE — Assessment & Plan Note (Signed)
This patient has an infection of the right great toe which is new. He is on clindamycin. He's been soaking the foot and lukewarm Dial soap soaks. He states that the foot has improved am concerned as it is very swollen and does have some persistent drainage. I suspect he may require a toe amputation. Based on his previous arteriogram I think he likely has adequate circulation to heal this. Arteriogram was back in September and I don't think things would've changed much since then. I will refer him to see Dr. Aldean BakerMarcus Duda to evaluate the right great toe.

## 2013-04-25 NOTE — Progress Notes (Signed)
[facility] [place]  Patient name: Jay Barber MRN: [id number] DOB: [birth year] Sex: male  REASON FOR VISIT: Right great toe wound.  HPI: Jay Barber is a 33 y.o. male who had dropped something on his left foot and I been following a wound on the left foot. This has now healed. However, he is now developed significant wound on his right great toe. This was reportedly a callus that became infected. He was seen in the emergency department and an x-ray was obtained which showed no evidence of osteomyelitis. He comes in for a routine follow up visit. He has been on clindamycin. He's been soaking the foot daily and lukewarm dye also soaks. He denies fever or chills. He states that the toe has actually significantly improved and there is much less drainage.   Past Medical History  Diagnosis Date  . Diabetes mellitus without complication   . Peripheral vascular disease    Family History  Problem Relation Age of Onset  . Heart disease Father   . Hypertension Mother   . Depression Mother   . GER disease Mother    SOCIAL HISTORY: History  Substance Use Topics  . Smoking status: Light Tobacco Smoker -- 0.25 packs/day for 13 years    Types: Cigarettes  . Smokeless tobacco: Never Used  . Alcohol Use: 10.8 oz/week    18 Cans of beer per week   No Known Allergies Current Outpatient Prescriptions  Medication Sig Dispense Refill  . clindamycin (CLEOCIN) 150 MG capsule Take 3 capsules (450 mg total) by mouth 3 (three) times daily.  90 capsule  0  . insulin aspart (NOVOLOG) 100 UNIT/ML injection Inject 2-4 Units into the skin 4 (four) times daily -  before meals and at bedtime. Uses a sliding scale.      . insulin glargine (LANTUS) 100 UNIT/ML injection Inject 15 Units into the skin at bedtime.      Marland Kitchen oxyCODONE-acetaminophen (PERCOCET/ROXICET) 5-325 MG per tablet Take 1-2 tablets by mouth every 4 (four) hours as needed for severe pain.  15 tablet  0   No current  facility-administered medications for this visit.   REVIEW OF SYSTEMS: Arly.Keller ] denotes positive finding; [  ] denotes negative finding  CARDIOVASCULAR:  [ ]  chest pain   [ ]  chest pressure   [ ]  palpitations   [ ]  orthopnea   [ ]  dyspnea on exertion   [ ]  claudication   [ ]  rest pain   [ ]  DVT   [ ]  phlebitis PULMONARY:   [ ]  productive cough   [ ]  asthma   [ ]  wheezing NEUROLOGIC:   [ ]  weakness  [ ]  paresthesias  [ ]  aphasia  [ ]  amaurosis  [ ]  dizziness HEMATOLOGIC:   [ ]  bleeding problems   [ ]  clotting disorders MUSCULOSKELETAL:  [ ]  joint pain   [ ]  joint swelling [ ]  leg swelling GASTROINTESTINAL: [ ]   blood in stool  [ ]   hematemesis GENITOURINARY:  [ ]   dysuria  [ ]   hematuria PSYCHIATRIC:  [ ]  history of major depression INTEGUMENTARY:  [ ]  rashes  Arly.Keller ] ulcers CONSTITUTIONAL:  [ ]  fever   [ ]  chills  PHYSICAL EXAM: Filed Vitals:   04/25/13 1025  BP: 132/72  Pulse: 82  Temp: 99.5 F (37.5 C)  TempSrc: Oral  Height: 5\' 9"  (1.753 m)  Weight: 142 lb 8 oz (64.638 kg)  SpO2: 100%   Body mass  index is 21.03 kg/(m^2). GENERAL: The patient is a well-nourished male, in no acute distress. The vital signs are documented above. CARDIOVASCULAR: There is a regular rate and rhythm. He has a palpable right femoral pulse and a palpable popliteal pulse in the right. I cannot palpate pedal pulses the foot is very swollen. PULMONARY: There is good air exchange bilaterally without wheezing or rales. ABDOMEN: Soft and non-tender with normal pitched bowel sounds.  MUSCULOSKELETAL: There are no major deformities or cyanosis. NEUROLOGIC: No focal weakness or paresthesias are detected. SKIN: wound on his left foot has healed. On the right side the entire foot is swollen and the great toe especially. There is some drainage and I debrided some dead skin. PSYCHIATRIC: The patient has a normal affect.  DATA:  Plain x-ray of the right great toe and 04/15/2013 shows soft tissue swelling and ulceration,  but no evidence of underlying osseous abnormality.  I reviewed his arteriogram from 11/06/2012. On the right side, he had some very mild disease in the above-knee pop 2 artery and no significant tibial artery occlusive disease except for some mild disease in the proximal anterior tibial artery.  I have independently interpreted his arterial Doppler study today which shows an ABI of 85% on the right.   MEDICAL ISSUES:  Atherosclerosis of native arteries of the extremities with ulceration(440.23) This patient has an infection of the right great toe which is new. He is on clindamycin. He's been soaking the foot and lukewarm Dial soap soaks. He states that the foot has improved am concerned as it is very swollen and does have some persistent drainage. I suspect he may require a toe amputation. Based on his previous arteriogram I think he likely has adequate circulation to heal this. Arteriogram was back in September and I don't think things would've changed much since then. I will refer him to see Dr. Aldean BakerMarcus Duda to evaluate the right great toe.    DICKSON,CHRISTOPHER S [facility] [place] Beeper: [phone]

## 2013-05-02 ENCOUNTER — Other Ambulatory Visit (HOSPITAL_COMMUNITY): Payer: Self-pay | Admitting: Orthopedic Surgery

## 2013-05-02 NOTE — Pre-Procedure Instructions (Signed)
Jay Barber  05/02/2013   Your procedure is scheduled on:  Fri, Mar 6 @ 1:15 PM  Report to [facility]  at 11:15 AM.  Call this number if you have problems the morning of surgery: [phone]   Remember:   Do not eat food or drink liquids after midnight.   Take these medicines the morning of surgery with A SIP OF WATER: Cleocin(Clindamycin) and Pain Pill(if needed)               No Goody's,BC's,Aleve,Aspirin,Ibuprofen,Fish Oil,or any Herbal Medications   Do not wear jewelry  Do not wear lotions, powders, or colognes. You may wear deodorant.  Men may shave face and neck.  Do not bring valuables to the hospital.  [facility] is not responsible                  for any belongings or valuables.               Contacts, dentures or bridgework may not be worn into surgery.  Leave suitcase in the car. After surgery it may be brought to your room.  For patients admitted to the hospital, discharge time is determined by your                treatment team.               Patients discharged the day of surgery will not be allowed to drive  home.    Special Instructions:  [facility] - Preparing for Surgery  Before surgery, you can play an important role.  Because skin is not sterile, your skin needs to be as free of germs as possible.  You can reduce the number of germs on you skin by washing with CHG (chlorahexidine gluconate) soap before surgery.  CHG is an antiseptic cleaner which kills germs and bonds with the skin to continue killing germs even after washing.  Please DO NOT use if you have an allergy to CHG or antibacterial soaps.  If your skin becomes reddened/irritated stop using the CHG and inform your nurse when you arrive at Short Stay.  Do not shave (including legs and underarms) for at least 48 hours prior to the first CHG shower.  You may shave your face.  Please follow these instructions carefully:   1.  Shower with CHG Soap the night before surgery and the                                 morning of Surgery.  2.  If you choose to wash your hair, wash your hair first as usual with your       normal shampoo.  3.  After you shampoo, rinse your hair and body thoroughly to remove the                      Shampoo.  4.  Use CHG as you would any other liquid soap.  You can apply chg directly       to the skin and wash gently with scrungie or a clean washcloth.  5.  Apply the CHG Soap to your body ONLY FROM THE NECK DOWN.        Do not use on open wounds or open sores.  Avoid contact with your eyes,       ears, mouth and genitals (private parts).  Wash  genitals (private parts)       with your normal soap.  6.  Wash thoroughly, paying special attention to the area where your surgery        will be performed.  7.  Thoroughly rinse your body with warm water from the neck down.  8.  DO NOT shower/wash with your normal soap after using and rinsing off       the CHG Soap.  9.  Pat yourself dry with a clean towel.            10.  Wear clean pajamas.            11.  Place clean sheets on your bed the night of your first shower and do not        sleep with pets.  Day of Surgery  Do not apply any lotions/deoderants the morning of surgery.  Please wear clean clothes to the hospital/surgery center.     Please read over the following fact sheets that you were given: Pain Booklet, Coughing and Deep Breathing and Surgical Site Infection Prevention

## 2013-05-03 ENCOUNTER — Encounter (HOSPITAL_COMMUNITY)
Admission: RE | Admit: 2013-05-03 | Discharge: 2013-05-03 | Disposition: A | Discharge status: Home / Self Care | Payer: Self-pay | Source: Ambulatory Visit | Attending: Orthopedic Surgery | Admitting: Orthopedic Surgery

## 2013-05-03 ENCOUNTER — Encounter (HOSPITAL_COMMUNITY): Payer: Self-pay

## 2013-05-03 DIAGNOSIS — Z01812 Encounter for preprocedural laboratory examination: Secondary | ICD-10-CM | POA: Insufficient documentation

## 2013-05-03 HISTORY — DX: Nocturia: R35.1

## 2013-05-03 HISTORY — DX: Panic disorder (episodic paroxysmal anxiety): F41.0

## 2013-05-03 HISTORY — DX: Personal history of other diseases of the respiratory system: Z87.09

## 2013-05-03 HISTORY — DX: Irritant contact dermatitis due friction or contact with other specified body fluids: L24.A9

## 2013-05-03 HISTORY — DX: Other injury of unspecified body region, initial encounter: T14.8XXA

## 2013-05-03 LAB — CBC
HEMATOCRIT: 33.8 % — AB (ref 39.0–52.0)
HEMOGLOBIN: 11.7 g/dL — AB (ref 13.0–17.0)
MCH: 31.5 pg (ref 26.0–34.0)
MCHC: 34.6 g/dL (ref 30.0–36.0)
MCV: 90.9 fL (ref 78.0–100.0)
Platelets: 284 10*3/uL (ref 150–400)
RBC: 3.72 MIL/uL — ABNORMAL LOW (ref 4.22–5.81)
RDW: 12.9 % (ref 11.5–15.5)
WBC: 6.3 10*3/uL (ref 4.0–10.5)

## 2013-05-03 LAB — COMPREHENSIVE METABOLIC PANEL
ALBUMIN: 2.3 g/dL — AB (ref 3.5–5.2)
ALK PHOS: 91 U/L (ref 39–117)
ALT: 9 U/L (ref 0–53)
AST: 14 U/L (ref 0–37)
BUN: 4 mg/dL — ABNORMAL LOW (ref 6–23)
CHLORIDE: 101 meq/L (ref 96–112)
CO2: 27 mEq/L (ref 19–32)
Calcium: 8.5 mg/dL (ref 8.4–10.5)
Creatinine, Ser: 0.56 mg/dL (ref 0.50–1.35)
GFR calc Af Amer: >90 mL/min (ref >90)
GFR calc non Af Amer: >90 mL/min (ref >90)
Glucose, Bld: 205 mg/dL — ABNORMAL HIGH (ref 70–99)
POTASSIUM: 4.4 meq/L (ref 3.7–5.3)
Sodium: 139 mEq/L (ref 137–147)
Total Bilirubin: 0.2 mg/dL — ABNORMAL LOW (ref 0.3–1.2)
Total Protein: 6.4 g/dL (ref 6.0–8.3)

## 2013-05-03 LAB — PROTIME-INR
INR: 0.94 (ref 0.00–1.49)
Prothrombin Time: 12.4 seconds (ref 11.6–15.2)

## 2013-05-03 MED ORDER — CEFAZOLIN SODIUM-DEXTROSE 2-3 GM-% IV SOLR
2.0000 g | INTRAVENOUS | Status: DC
  Filled 2013-05-03: qty 50

## 2013-05-03 NOTE — Progress Notes (Addendum)
  Pt doesn't have a cardiologist  Stress test done 5273yrs ago-was sent as part of routine physical  Denies ever having an echo or heart cath  Denies CXR in past yr   EKG in epic from 11-06-12  Pt doesn't have a medical MD at the present time

## 2013-05-03 NOTE — Progress Notes (Signed)
Fasting blood sugar runs 130-140 

## 2013-05-04 ENCOUNTER — Encounter (HOSPITAL_COMMUNITY): Payer: Self-pay | Admitting: Anesthesiology

## 2013-05-04 ENCOUNTER — Encounter (HOSPITAL_COMMUNITY)
Admission: RE | Disposition: A | Discharge status: Home / Self Care | Payer: Self-pay | Source: Ambulatory Visit | Attending: Orthopedic Surgery

## 2013-05-04 ENCOUNTER — Encounter (HOSPITAL_COMMUNITY): Payer: Self-pay | Admitting: *Deleted

## 2013-05-04 ENCOUNTER — Ambulatory Visit (HOSPITAL_COMMUNITY)
Admission: RE | Admit: 2013-05-04 | Discharge: 2013-05-04 | Disposition: A | Discharge status: Home / Self Care | Payer: Self-pay | Source: Ambulatory Visit | Attending: Orthopedic Surgery | Admitting: Orthopedic Surgery

## 2013-05-04 DIAGNOSIS — I739 Peripheral vascular disease, unspecified: Secondary | ICD-10-CM | POA: Insufficient documentation

## 2013-05-04 DIAGNOSIS — M9080 Osteopathy in diseases classified elsewhere, unspecified site: Secondary | ICD-10-CM | POA: Insufficient documentation

## 2013-05-04 DIAGNOSIS — E1169 Type 2 diabetes mellitus with other specified complication: Secondary | ICD-10-CM | POA: Insufficient documentation

## 2013-05-04 DIAGNOSIS — N3944 Nocturnal enuresis: Secondary | ICD-10-CM | POA: Insufficient documentation

## 2013-05-04 DIAGNOSIS — Z5320 Procedure and treatment not carried out because of patient's decision for unspecified reasons: Secondary | ICD-10-CM | POA: Insufficient documentation

## 2013-05-04 DIAGNOSIS — F17200 Nicotine dependence, unspecified, uncomplicated: Secondary | ICD-10-CM | POA: Insufficient documentation

## 2013-05-04 DIAGNOSIS — M869 Osteomyelitis, unspecified: Secondary | ICD-10-CM | POA: Insufficient documentation

## 2013-05-04 LAB — GLUCOSE, CAPILLARY: Glucose-Capillary: 275 mg/dL — ABNORMAL HIGH (ref 70–99)

## 2013-05-04 SURGERY — AMPUTATION, FOOT, RAY
Anesthesia: General

## 2013-05-04 MED ORDER — LACTATED RINGERS IV SOLN
INTRAVENOUS | Status: DC
  Administered 2013-05-04: 15:00:00 via INTRAVENOUS

## 2013-05-04 SURGICAL SUPPLY — 29 items
BANDAGE GAUZE ELAST BULKY 4 IN (GAUZE/BANDAGES/DRESSINGS) ×4 IMPLANT
BLADE SAW SGTL MED 73X18.5 STR (BLADE) IMPLANT
BNDG COHESIVE 4X5 TAN STRL (GAUZE/BANDAGES/DRESSINGS) ×4 IMPLANT
COVER SURGICAL LIGHT HANDLE (MISCELLANEOUS) ×4 IMPLANT
DRAPE U-SHAPE 47X51 STRL (DRAPES) ×8 IMPLANT
DRSG ADAPTIC 3X8 NADH LF (GAUZE/BANDAGES/DRESSINGS) ×4 IMPLANT
DRSG PAD ABDOMINAL 8X10 ST (GAUZE/BANDAGES/DRESSINGS) ×8 IMPLANT
DURAPREP 26ML APPLICATOR (WOUND CARE) ×4 IMPLANT
ELECT REM PT RETURN 9FT ADLT (ELECTROSURGICAL) ×3
ELECTRODE REM PT RTRN 9FT ADLT (ELECTROSURGICAL) ×2 IMPLANT
GLOVE BIOGEL PI IND STRL 9 (GLOVE) ×2 IMPLANT
GLOVE BIOGEL PI INDICATOR 9 (GLOVE) ×2
GLOVE SURG ORTHO 9.0 STRL STRW (GLOVE) ×4 IMPLANT
GOWN PREVENTION PLUS XLARGE (GOWN DISPOSABLE) ×4 IMPLANT
GOWN SRG XL XLNG 56XLVL 4 (GOWN DISPOSABLE) ×2 IMPLANT
GOWN STRL NON-REIN XL XLG LVL4 (GOWN DISPOSABLE) ×3
KIT BASIN OR (CUSTOM PROCEDURE TRAY) ×4 IMPLANT
KIT ROOM TURNOVER OR (KITS) ×4 IMPLANT
NS IRRIG 1000ML POUR BTL (IV SOLUTION) ×4 IMPLANT
PACK ORTHO EXTREMITY (CUSTOM PROCEDURE TRAY) ×4 IMPLANT
PAD ARMBOARD 7.5X6 YLW CONV (MISCELLANEOUS) ×8 IMPLANT
SPONGE GAUZE 4X4 12PLY (GAUZE/BANDAGES/DRESSINGS) ×4 IMPLANT
SPONGE LAP 18X18 X RAY DECT (DISPOSABLE) ×4 IMPLANT
STOCKINETTE IMPERVIOUS LG (DRAPES) IMPLANT
SUT ETHILON 2 0 PSLX (SUTURE) ×8 IMPLANT
TOWEL OR 17X24 6PK STRL BLUE (TOWEL DISPOSABLE) ×4 IMPLANT
TOWEL OR 17X26 10 PK STRL BLUE (TOWEL DISPOSABLE) ×4 IMPLANT
UNDERPAD 30X30 INCONTINENT (UNDERPADS AND DIAPERS) ×4 IMPLANT
WATER STERILE IRR 1000ML POUR (IV SOLUTION) ×4 IMPLANT

## 2013-05-04 NOTE — H&P (Signed)
Jay Barber is an 33 y.o. male.   Chief Complaint: Osteomyelitis abscess right great toe HPI: Patient is a 33 year old gentleman type I diabetic with ulceration ostomy was right great toe  Past Medical History  Diagnosis Date  . Peripheral vascular disease   . Diabetes mellitus without complication     takes Lantus and Novolog  . History of bronchitis     only had once in his life-3+yrs ago  . Nocturia   . Panic attacks     hx of but no meds required  . Drainage from wound     right big toe    Past Surgical History  Procedure Laterality Date  . Mouth surgery    . Esophagogastroduodenoscopy      Family History  Problem Relation Age of Onset  . Heart disease Father   . Hypertension Mother   . Depression Mother   . GER disease Mother    Social History:  reports that he has been smoking Cigarettes.  He has a 7 pack-year smoking history. He has never used smokeless tobacco. He reports that he drinks alcohol. He reports that he does not use illicit drugs.  Allergies: No Known Allergies  No prescriptions prior to admission    Results for orders placed during the hospital encounter of 05/03/13 (from the past 48 hour(s))  CBC     Status: Abnormal   Collection Time    05/03/13  9:32 AM      Result Value Ref Range   WBC 6.3  4.0 - 10.5 K/uL   RBC 3.72 (*) 4.22 - 5.81 MIL/uL   Hemoglobin 11.7 (*) 13.0 - 17.0 g/dL   HCT 33.8 (*) 39.0 - 52.0 %   MCV 90.9  78.0 - 100.0 fL   MCH 31.5  26.0 - 34.0 pg   MCHC 34.6  30.0 - 36.0 g/dL   RDW 12.9  11.5 - 15.5 %   Platelets 284  150 - 400 K/uL  COMPREHENSIVE METABOLIC PANEL     Status: Abnormal   Collection Time    05/03/13  9:32 AM      Result Value Ref Range   Sodium 139  137 - 147 mEq/L   Potassium 4.4  3.7 - 5.3 mEq/L   Chloride 101  96 - 112 mEq/L   CO2 27  19 - 32 mEq/L   Glucose, Bld 205 (*) 70 - 99 mg/dL   BUN 4 (*) 6 - 23 mg/dL   Creatinine, Ser 0.56  0.50 - 1.35 mg/dL   Calcium 8.5  8.4 - 10.5 mg/dL   Total  Protein 6.4  6.0 - 8.3 g/dL   Albumin 2.3 (*) 3.5 - 5.2 g/dL   AST 14  0 - 37 U/L   ALT 9  0 - 53 U/L   Alkaline Phosphatase 91  39 - 117 U/L   Total Bilirubin 0.2 (*) 0.3 - 1.2 mg/dL   GFR calc non Af Amer >90  >90 mL/min   GFR calc Af Amer >90  >90 mL/min   Comment: (NOTE)     The eGFR has been calculated using the CKD EPI equation.     This calculation has not been validated in all clinical situations.     eGFR's persistently <90 mL/min signify possible Chronic Kidney     Disease.  PROTIME-INR     Status: None   Collection Time    05/03/13  9:32 AM      Result Value Ref Range  Prothrombin Time 12.4  11.6 - 15.2 seconds   INR 0.94  0.00 - 1.49   No results found.  Review of Systems  All other systems reviewed and are negative.    There were no vitals taken for this visit. Physical Exam  On examination patient has ulceration cellulitis swelling is to minimize right great toe. Assessment/Plan Assessment: Osteomyelitis ulceration swelling and right great toe.  Plan: Will plan for amputation of right great toe through the MTP joint. Risk and benefits were discussed including infection nonhealing of the wound need for additional surgery. Patient states he understands and wished to proceed at this time.  [name] 05/04/2013, 6:16 AM

## 2013-05-04 NOTE — Progress Notes (Signed)
Pt called and states that his Blood sugar is 47 and he is due to come in at 1400.  Pt instructed to drink something to get his sugar up.  States that he does not have anything to drink only water and does not have any sugar at his home.  Pt is unable to drive to the hospital at this time due to low CBG.   Pt states that he has 2 cookies he can eat.  Instructed to eat them if that is all he has and recheck his cbg. Once cbg is better to come on into the hospital. Voices understanding.

## 2013-05-04 NOTE — Progress Notes (Signed)
Called pt and asked what his sugar is now states that it is 138.  Instructed pt to come on to the hospital, so if his cbg was to drop again we will have an IV and can give sugar if needed though the IV. Voices understanding.

## 2013-05-04 NOTE — Progress Notes (Signed)
Patient decided not to have surgery today. Dr duda notified

## 2013-05-04 NOTE — Progress Notes (Signed)
Patient ate 4 oreos and 4 oz of diet pepsi at 1230pm prior to arrival. Dr. Maple HudsonMoser notified and dr. Lajoyce Cornersduda notified. Will talk with patient to discuss anesthesia options.

## 2013-05-08 ENCOUNTER — Encounter (HOSPITAL_COMMUNITY): Payer: Self-pay | Admitting: Pharmacy Technician

## 2013-05-09 ENCOUNTER — Other Ambulatory Visit (HOSPITAL_COMMUNITY): Payer: Self-pay | Admitting: Orthopedic Surgery

## 2013-05-10 ENCOUNTER — Other Ambulatory Visit (HOSPITAL_COMMUNITY): Payer: Self-pay | Admitting: Orthopedic Surgery

## 2013-05-10 NOTE — Progress Notes (Signed)
[facility] AM on Friday, May 11, 2013. Pt instructed to follow instructions ( that he still has from previous PAT) regarding NPO after midnight and taking Cleocin the morning of the procedure with only a small sip of water.

## 2013-05-11 ENCOUNTER — Ambulatory Visit (HOSPITAL_COMMUNITY): Payer: MEDICAID | Admitting: Critical Care Medicine

## 2013-05-11 ENCOUNTER — Encounter (HOSPITAL_COMMUNITY): Payer: Self-pay | Admitting: Critical Care Medicine

## 2013-05-11 ENCOUNTER — Encounter (HOSPITAL_COMMUNITY)
Admission: RE | Disposition: A | Discharge status: Home / Self Care | Payer: Self-pay | Source: Ambulatory Visit | Attending: Orthopedic Surgery

## 2013-05-11 ENCOUNTER — Ambulatory Visit (HOSPITAL_COMMUNITY)
Admission: RE | Admit: 2013-05-11 | Discharge: 2013-05-11 | Disposition: A | Discharge status: Home / Self Care | Payer: MEDICAID | Source: Ambulatory Visit | Attending: Orthopedic Surgery | Admitting: Orthopedic Surgery

## 2013-05-11 ENCOUNTER — Encounter (HOSPITAL_COMMUNITY): Payer: MEDICAID | Admitting: Critical Care Medicine

## 2013-05-11 DIAGNOSIS — L02619 Cutaneous abscess of unspecified foot: Secondary | ICD-10-CM | POA: Insufficient documentation

## 2013-05-11 DIAGNOSIS — L03039 Cellulitis of unspecified toe: Secondary | ICD-10-CM | POA: Insufficient documentation

## 2013-05-11 DIAGNOSIS — F411 Generalized anxiety disorder: Secondary | ICD-10-CM | POA: Insufficient documentation

## 2013-05-11 DIAGNOSIS — F17200 Nicotine dependence, unspecified, uncomplicated: Secondary | ICD-10-CM | POA: Insufficient documentation

## 2013-05-11 DIAGNOSIS — I739 Peripheral vascular disease, unspecified: Secondary | ICD-10-CM | POA: Insufficient documentation

## 2013-05-11 DIAGNOSIS — Z794 Long term (current) use of insulin: Secondary | ICD-10-CM | POA: Insufficient documentation

## 2013-05-11 DIAGNOSIS — M86671 Other chronic osteomyelitis, right ankle and foot: Secondary | ICD-10-CM

## 2013-05-11 DIAGNOSIS — K219 Gastro-esophageal reflux disease without esophagitis: Secondary | ICD-10-CM | POA: Insufficient documentation

## 2013-05-11 DIAGNOSIS — E109 Type 1 diabetes mellitus without complications: Secondary | ICD-10-CM | POA: Insufficient documentation

## 2013-05-11 DIAGNOSIS — M869 Osteomyelitis, unspecified: Secondary | ICD-10-CM | POA: Insufficient documentation

## 2013-05-11 DIAGNOSIS — I10 Essential (primary) hypertension: Secondary | ICD-10-CM | POA: Insufficient documentation

## 2013-05-11 HISTORY — PX: AMPUTATION: SHX166

## 2013-05-11 LAB — GLUCOSE, CAPILLARY
GLUCOSE-CAPILLARY: 248 mg/dL — AB (ref 70–99)
Glucose-Capillary: 210 mg/dL — ABNORMAL HIGH (ref 70–99)

## 2013-05-11 SURGERY — AMPUTATION DIGIT
Anesthesia: General | Site: Toe | Laterality: Right

## 2013-05-11 MED ORDER — OXYCODONE-ACETAMINOPHEN 5-325 MG PO TABS: 1.0000 | ORAL_TABLET | ORAL | Status: DC | PRN

## 2013-05-11 MED ORDER — PROPOFOL 10 MG/ML IV BOLUS
INTRAVENOUS | Status: AC
  Filled 2013-05-11: qty 20

## 2013-05-11 MED ORDER — ONDANSETRON HCL 4 MG/2ML IJ SOLN
INTRAMUSCULAR | Status: DC | PRN
  Administered 2013-05-11: 4 mg via INTRAVENOUS

## 2013-05-11 MED ORDER — LACTATED RINGERS IV SOLN
INTRAVENOUS | Status: DC | PRN
  Administered 2013-05-11: 07:00:00 via INTRAVENOUS

## 2013-05-11 MED ORDER — FENTANYL CITRATE 0.05 MG/ML IJ SOLN
INTRAMUSCULAR | Status: AC
  Filled 2013-05-11: qty 5

## 2013-05-11 MED ORDER — LIDOCAINE HCL (CARDIAC) 10 MG/ML IV SOLN
INTRAVENOUS | Status: DC | PRN
  Administered 2013-05-11: 30 mg via INTRAVENOUS

## 2013-05-11 MED ORDER — ONDANSETRON HCL 4 MG/2ML IJ SOLN
INTRAMUSCULAR | Status: AC
  Filled 2013-05-11: qty 2

## 2013-05-11 MED ORDER — ROCURONIUM BROMIDE 50 MG/5ML IV SOLN
INTRAVENOUS | Status: AC
  Filled 2013-05-11: qty 1

## 2013-05-11 MED ORDER — LIDOCAINE HCL (CARDIAC) 20 MG/ML IV SOLN
INTRAVENOUS | Status: AC
  Filled 2013-05-11: qty 5

## 2013-05-11 MED ORDER — MIDAZOLAM HCL 5 MG/5ML IJ SOLN
INTRAMUSCULAR | Status: DC | PRN
  Administered 2013-05-11: 2 mg via INTRAVENOUS

## 2013-05-11 MED ORDER — ONDANSETRON HCL 4 MG/2ML IJ SOLN: 4.0000 mg | Freq: Once | INTRAMUSCULAR | Status: DC | PRN

## 2013-05-11 MED ORDER — INSULIN ASPART 100 UNIT/ML ~~LOC~~ SOLN
SUBCUTANEOUS | Status: AC
  Administered 2013-05-11: 3 [IU] via SUBCUTANEOUS
  Filled 2013-05-11: qty 1

## 2013-05-11 MED ORDER — SUCCINYLCHOLINE CHLORIDE 20 MG/ML IJ SOLN
INTRAMUSCULAR | Status: DC | PRN
  Administered 2013-05-11: 100 mg via INTRAVENOUS

## 2013-05-11 MED ORDER — EPHEDRINE SULFATE 50 MG/ML IJ SOLN
INTRAMUSCULAR | Status: AC
  Filled 2013-05-11: qty 1

## 2013-05-11 MED ORDER — HYDROMORPHONE HCL PF 1 MG/ML IJ SOLN: 0.2500 mg | INTRAMUSCULAR | Status: DC | PRN

## 2013-05-11 MED ORDER — FENTANYL CITRATE 0.05 MG/ML IJ SOLN
INTRAMUSCULAR | Status: DC | PRN
  Administered 2013-05-11 (×2): 50 ug via INTRAVENOUS

## 2013-05-11 MED ORDER — SODIUM CHLORIDE 0.9 % IJ SOLN
INTRAMUSCULAR | Status: AC
  Filled 2013-05-11: qty 10

## 2013-05-11 MED ORDER — CEFAZOLIN SODIUM-DEXTROSE 2-3 GM-% IV SOLR
2.0000 g | INTRAVENOUS | Status: AC
  Administered 2013-05-11: 2 g via INTRAVENOUS

## 2013-05-11 MED ORDER — CEFAZOLIN SODIUM-DEXTROSE 2-3 GM-% IV SOLR
INTRAVENOUS | Status: AC
  Filled 2013-05-11: qty 50

## 2013-05-11 MED ORDER — PROPOFOL 10 MG/ML IV BOLUS
INTRAVENOUS | Status: DC | PRN
  Administered 2013-05-11: 140 mg via INTRAVENOUS

## 2013-05-11 MED ORDER — ARTIFICIAL TEARS OP OINT
TOPICAL_OINTMENT | OPHTHALMIC | Status: AC
  Filled 2013-05-11: qty 3.5

## 2013-05-11 MED ORDER — OXYCODONE HCL 5 MG/5ML PO SOLN: 5.0000 mg | Freq: Once | ORAL | Status: DC | PRN

## 2013-05-11 MED ORDER — GLYCOPYRROLATE 0.2 MG/ML IJ SOLN
INTRAMUSCULAR | Status: AC
  Filled 2013-05-11: qty 1

## 2013-05-11 MED ORDER — MIDAZOLAM HCL 2 MG/2ML IJ SOLN
INTRAMUSCULAR | Status: AC
  Filled 2013-05-11: qty 2

## 2013-05-11 MED ORDER — OXYCODONE HCL 5 MG PO TABS: 5.0000 mg | ORAL_TABLET | Freq: Once | ORAL | Status: DC | PRN

## 2013-05-11 SURGICAL SUPPLY — 32 items
BANDAGE GAUZE ELAST BULKY 4 IN (GAUZE/BANDAGES/DRESSINGS) ×3 IMPLANT
BNDG CMPR 9X4 STRL LF SNTH (GAUZE/BANDAGES/DRESSINGS) ×1
BNDG COHESIVE 4X5 TAN STRL (GAUZE/BANDAGES/DRESSINGS) ×3 IMPLANT
BNDG ESMARK 4X9 LF (GAUZE/BANDAGES/DRESSINGS) ×2 IMPLANT
COVER SURGICAL LIGHT HANDLE (MISCELLANEOUS) ×3 IMPLANT
DRAPE U-SHAPE 47X51 STRL (DRAPES) ×3 IMPLANT
DRSG ADAPTIC 3X8 NADH LF (GAUZE/BANDAGES/DRESSINGS) ×2 IMPLANT
DRSG PAD ABDOMINAL 8X10 ST (GAUZE/BANDAGES/DRESSINGS) ×3 IMPLANT
DURAPREP 26ML APPLICATOR (WOUND CARE) ×3 IMPLANT
ELECT REM PT RETURN 9FT ADLT (ELECTROSURGICAL) ×3
ELECTRODE REM PT RTRN 9FT ADLT (ELECTROSURGICAL) ×1 IMPLANT
GLOVE BIOGEL PI IND STRL 7.5 (GLOVE) IMPLANT
GLOVE BIOGEL PI IND STRL 9 (GLOVE) ×1 IMPLANT
GLOVE BIOGEL PI INDICATOR 7.5 (GLOVE) ×2
GLOVE BIOGEL PI INDICATOR 9 (GLOVE) ×2
GLOVE SURG ORTHO 9.0 STRL STRW (GLOVE) ×3 IMPLANT
GOWN STRL REUS W/ TWL XL LVL3 (GOWN DISPOSABLE) ×2 IMPLANT
GOWN STRL REUS W/TWL XL LVL3 (GOWN DISPOSABLE) ×6
KIT BASIN OR (CUSTOM PROCEDURE TRAY) ×3 IMPLANT
KIT ROOM TURNOVER OR (KITS) ×3 IMPLANT
MANIFOLD NEPTUNE II (INSTRUMENTS) ×1 IMPLANT
NEEDLE 22X1 1/2 (OR ONLY) (NEEDLE) ×2 IMPLANT
NS IRRIG 1000ML POUR BTL (IV SOLUTION) ×3 IMPLANT
PACK ORTHO EXTREMITY (CUSTOM PROCEDURE TRAY) ×3 IMPLANT
PAD ABD 8X10 STRL (GAUZE/BANDAGES/DRESSINGS) ×2 IMPLANT
PAD ARMBOARD 7.5X6 YLW CONV (MISCELLANEOUS) ×6 IMPLANT
SPONGE GAUZE 4X4 12PLY (GAUZE/BANDAGES/DRESSINGS) ×2 IMPLANT
SUCTION FRAZIER TIP 10 FR DISP (SUCTIONS) ×2 IMPLANT
SUT ETHILON 2 0 PSLX (SUTURE) ×3 IMPLANT
SYR CONTROL 10ML LL (SYRINGE) ×2 IMPLANT
TOWEL OR 17X24 6PK STRL BLUE (TOWEL DISPOSABLE) ×3 IMPLANT
TOWEL OR 17X26 10 PK STRL BLUE (TOWEL DISPOSABLE) ×3 IMPLANT

## 2013-05-11 NOTE — Anesthesia Procedure Notes (Signed)
Procedure Name: Intubation Date/Time: 05/11/2013 7:32 AM Performed by: Elon AlasLEE, Fayth Trefry BROWN Pre-anesthesia Checklist: Patient identified, Timeout performed, Emergency Drugs available, Suction available and Patient being monitored Patient Re-evaluated:Patient Re-evaluated prior to inductionOxygen Delivery Method: Circle system utilized Preoxygenation: Pre-oxygenation with 100% oxygen Intubation Type: IV induction Ventilation: Mask ventilation without difficulty Laryngoscope Size: 4 and Mac Grade View: Grade I Tube type: Oral Tube size: 7.5 mm Number of attempts: 1 Airway Equipment and Method: Stylet Placement Confirmation: positive ETCO2,  ETT inserted through vocal cords under direct vision,  CO2 detector and breath sounds checked- equal and bilateral Secured at: 22 cm Tube secured with: Tape Dental Injury: Teeth and Oropharynx as per pre-operative assessment

## 2013-05-11 NOTE — Progress Notes (Signed)
Orthopedic Tech Progress Note Patient Details:  Jay BlazerJason B Barber 10/22/1980 [id number]  Ortho Devices Type of Ortho Device: Crutches;Postop shoe/boot Ortho Device/Splint Interventions: Application   Shawnie PonsCammer, Jay Barber 05/11/2013, 8:46 AM

## 2013-05-11 NOTE — H&P (Signed)
Jay BlazerJason B Barber is an 33 y.o. male.   Chief Complaint: Osteomyelitis ulceration right great toe HPI: Patient is a 33 year old gentleman diabetic peripheral vascular disease with chronic ulceration right great toe. There are destructive lytic changes of the distal phalanx and patient has failed prolonged conservative therapy.  Past Medical History  Diagnosis Date  . Peripheral vascular disease   . Diabetes mellitus without complication     takes Lantus and Novolog  . History of bronchitis     only had once in his life-3+yrs ago  . Nocturia   . Panic attacks     hx of but no meds required  . Drainage from wound     right big toe    Past Surgical History  Procedure Laterality Date  . Mouth surgery    . Esophagogastroduodenoscopy      Family History  Problem Relation Age of Onset  . Heart disease Father   . Hypertension Mother   . Depression Mother   . GER disease Mother    Social History:  reports that he has been smoking Cigarettes.  He has a 7 pack-year smoking history. He has never used smokeless tobacco. He reports that he drinks alcohol. He reports that he does not use illicit drugs.  Allergies: No Known Allergies  Medications Prior to Admission  Medication Sig Dispense Refill  . clindamycin (CLEOCIN) 150 MG capsule Take 450 mg by mouth 3 (three) times daily.      . insulin aspart (NOVOLOG) 100 UNIT/ML injection Inject 2-4 Units into the skin 4 (four) times daily -  before meals and at bedtime. Uses a sliding scale.      . insulin glargine (LANTUS) 100 UNIT/ML injection Inject 15 Units into the skin at bedtime.        Results for orders placed during the hospital encounter of 05/11/13 (from the past 48 hour(s))  GLUCOSE, CAPILLARY     Status: Abnormal   Collection Time    05/11/13  5:55 AM      Result Value Ref Range   Glucose-Capillary 248 (*) 70 - 99 mg/dL   No results found.  Review of Systems  All other systems reviewed and are negative.    Blood pressure  152/96, pulse 82, temperature 97.8 F (36.6 C), temperature source Oral, resp. rate 18, SpO2 100.00%. Physical Exam  On examination patient has palpable pulses. There is an ulcer with exposed bone right great toe. There are destructive changes radiographically.  Assessment/Plan Assessment: Osteomyelitis ulceration right great toe.  Plan: We'll plan for amputation of the right great toe through the MTP joint. Risks and benefits were discussed including infection neurovascular injury nonhealing of the wound need for additional surgery. Patient states he understands and wished to proceed at this time.  Ivo Moga V 05/11/2013, 6:32 AM

## 2013-05-11 NOTE — Anesthesia Preprocedure Evaluation (Addendum)
Anesthesia Evaluation  Patient identified by MRN, date of birth, ID band Patient awake    Reviewed: Allergy & Precautions, H&P , NPO status , Patient's Chart, lab work & pertinent test results  Airway Mallampati: II TM Distance: >3 FB Neck ROM: Full    Dental  (+) Dental Advisory Given, Edentulous Upper, Edentulous Lower   Pulmonary Current Smoker,  breath sounds clear to auscultation        Cardiovascular hypertension, + Peripheral Vascular Disease Rhythm:Regular Rate:Normal     Neuro/Psych PSYCHIATRIC DISORDERS Anxiety    GI/Hepatic GERD-  ,  Endo/Other  diabetes, Type 1, Insulin Dependent  Renal/GU      Musculoskeletal   Abdominal   Peds  Hematology   Anesthesia Other Findings   Reproductive/Obstetrics                         Anesthesia Physical Anesthesia Plan  ASA: III  Anesthesia Plan: General   Post-op Pain Management:    Induction: Intravenous  Airway Management Planned: Oral ETT  Additional Equipment:   Intra-op Plan:   Post-operative Plan: Extubation in OR  Informed Consent: I have reviewed the patients History and Physical, chart, labs and discussed the procedure including the risks, benefits and alternatives for the proposed anesthesia with the patient or authorized representative who has indicated his/her understanding and acceptance.   Dental advisory given  Plan Discussed with: Anesthesiologist and Surgeon  Anesthesia Plan Comments: (Longstanding Type 1 DM 248 Osteomyelitis R. Great Toe  Plan GA with oral ETT  Kipp Broodavid Joslin, MD)      Anesthesia Quick Evaluation

## 2013-05-11 NOTE — Anesthesia Postprocedure Evaluation (Signed)
  Anesthesia Post-op Note  Patient: Jay Barber  Procedure(s) Performed: Procedure(s) with comments: AMPUTATION DIGIT (Right) - Right Great Toe Amputation at MTP  Patient Location: PACU  Anesthesia Type:General  Level of Consciousness: awake, alert  and oriented  Airway and Oxygen Therapy: Patient Spontanous Breathing  Post-op Pain: mild  Post-op Assessment: Post-op Vital signs reviewed, Patient's Cardiovascular Status Stable, Respiratory Function Stable, Patent Airway and Pain level controlled  Post-op Vital Signs: stable  Complications: No apparent anesthesia complications

## 2013-05-11 NOTE — Preoperative (Signed)
Beta Blockers   Reason not to administer Beta Blockers:Not Applicable 

## 2013-05-11 NOTE — Transfer of Care (Signed)
Immediate Anesthesia Transfer of Care Note  Patient: Jay Barber  Procedure(s) Performed: Procedure(s) with comments: AMPUTATION DIGIT (Right) - Right Great Toe Amputation at MTP  Patient Location: PACU  Anesthesia Type:General  Level of Consciousness: sedated  Airway & Oxygen Therapy: Patient Spontanous Breathing and Patient connected to nasal cannula oxygen  Post-op Assessment: Report given to PACU RN, Post -op Vital signs reviewed and stable and Patient moving all extremities X 4  Post vital signs: Reviewed and stable  Complications: No apparent anesthesia complications

## 2013-05-11 NOTE — Discharge Summary (Signed)
Discharge to home in stable condition status post right great toe amputation at the MTP joint for abscess and osteomyelitis. Followup in the office in one week. Continue clindamycin antibiotics. Dressing clean dry and intact. Touchdown weightbearing on the right with crutches and postoperative shoe.

## 2013-05-11 NOTE — Op Note (Signed)
OPERATIVE REPORT  DATE OF SURGERY: 05/11/2013  PATIENT:  Jay BlazerJason B Almanzar,  33 y.o. male  PRE-OPERATIVE DIAGNOSIS:  Osteomyelitis Abscess Right Great Toe  POST-OPERATIVE DIAGNOSIS:  Osteomyelitis abscess right great toe  PROCEDURE:  Procedure(s): AMPUTATION DIGIT  SURGEON:  Surgeon(s): Nadara MustardMarcus V Duda, MD  ANESTHESIA:   general  EBL:  min ML  SPECIMEN:  No Specimen  TOURNIQUET:  * No tourniquets in log *  PROCEDURE DETAILS: Patient is a 33 year old gentleman who presents with chronic abscess osteomyelitis right great toe. He presents for amputation through the MTP joint. Risks and benefits were discussed including persistent infection nonhealing of the wound need for additional surgery. Patient states he understands and wishes to proceed at this time. Description of procedure patient was brought to the operating room and underwent a general anesthetic. After adequate levels of anesthesia were obtained patient's right lower extremity was prepped using DuraPrep draped into a sterile field. A fishmouth incision was made just distal to the MTP joint. There was some purulence draining from the IP joint this did not contaminate the wound. The toe was amputated through the MTP joint electrocautery was used for hemostasis the wound was irrigated with normal saline. The incision was closed using 2-0 nylon. The wound is covered Adaptic orthopedic sponges AB dressing Kerlix and Coban. Patient was extubated taken to the PACU in stable condition plan for discharge to home.  PLAN OF CARE: Discharge to home after PACU  PATIENT DISPOSITION:  PACU - hemodynamically stable.   Nadara MustardUDA,MARCUS V, MD 05/11/2013 9:16 AM

## 2013-05-15 ENCOUNTER — Encounter (HOSPITAL_COMMUNITY): Payer: Self-pay | Admitting: Orthopedic Surgery

## 2013-05-23 ENCOUNTER — Ambulatory Visit: Payer: Self-pay | Admitting: Vascular Surgery

## 2013-05-23 ENCOUNTER — Encounter (HOSPITAL_COMMUNITY): Payer: Self-pay

## 2013-07-24 ENCOUNTER — Encounter: Payer: Self-pay | Admitting: Vascular Surgery

## 2013-07-25 ENCOUNTER — Encounter: Payer: Self-pay | Admitting: Vascular Surgery

## 2013-07-25 ENCOUNTER — Ambulatory Visit (INDEPENDENT_AMBULATORY_CARE_PROVIDER_SITE_OTHER): Payer: Self-pay | Admitting: Vascular Surgery

## 2013-07-25 VITALS — BP 140/81 | HR 74 | Ht 71.0 in | Wt 142.0 lb

## 2013-07-25 DIAGNOSIS — I739 Peripheral vascular disease, unspecified: Secondary | ICD-10-CM

## 2013-07-25 DIAGNOSIS — L98499 Non-pressure chronic ulcer of skin of other sites with unspecified severity: Principal | ICD-10-CM

## 2013-07-25 NOTE — Assessment & Plan Note (Signed)
He has mild popliteal artery occlusive disease. His toe amputation site is healed. Given his diabetes and smoking history I have ordered follow up ABIs in one year now see him back at that time. We have discussed the importance of tobacco cessation. I've encouraged him to stay as active as possible. We have discussed taking good care of his feet to prevent future wounds. He will call sooner if he has problems.

## 2013-07-25 NOTE — Progress Notes (Signed)
[facility] [place]  Patient name: Jay Barber MRN: [id number] DOB: 03-26-1980 Sex: male  REASON FOR VISIT: follow up of right great toe wound.  HPI: Jay Barber is a 33 y.o. male who had dropped something on his foot and developed a wound on the right great toe.    Previous studies showed: 1. Arteriogram from 11/06/2012. On the right side, he had some very mild disease in the above-knee popliteal artery and no significant tibial artery occlusive disease except for some mild disease in the proximal anterior tibial artery.  2. His arterial Doppler study shows an ABI of 85% on the right.   He underwent a right great toe amputation by Dr. Lajoyce Corners in March and has done well from that standpoint. He describes a burning pain in the forefoot consistent with neuropathy. He continues to smoke a half pack per day but is trying to cut back.  Past Medical History  Diagnosis Date  . Peripheral vascular disease   . Diabetes mellitus without complication     takes Lantus and Novolog  . History of bronchitis     only had once in his life-3+yrs ago  . Nocturia   . Panic attacks     hx of but no meds required  . Drainage from wound     right big toe   Family History  Problem Relation Age of Onset  . Heart disease Father   . Hypertension Mother   . Depression Mother   . GER disease Mother    SOCIAL HISTORY: History  Substance Use Topics  . Smoking status: Current Every Day Smoker -- 0.50 packs/day for 14 years    Types: Cigarettes  . Smokeless tobacco: Never Used  . Alcohol Use: 0.0 oz/week     Comment: occaionally   No Known Allergies Current Outpatient Prescriptions  Medication Sig Dispense Refill  . insulin aspart (NOVOLOG) 100 UNIT/ML injection Inject 2-4 Units into the skin 4 (four) times daily -  before meals and at bedtime. Uses a sliding scale.      . insulin glargine (LANTUS) 100 UNIT/ML injection Inject 15 Units into the skin at bedtime.      Marland Kitchen  loperamide (IMODIUM A-D) 2 MG tablet Take 2 mg by mouth as needed for diarrhea or loose stools.      Marland Kitchen oxyCODONE-acetaminophen (ROXICET) 5-325 MG per tablet Take 1 tablet by mouth every 4 (four) hours as needed for severe pain.  60 tablet  0  . clindamycin (CLEOCIN) 150 MG capsule Take 450 mg by mouth 3 (three) times daily.       No current facility-administered medications for this visit.   REVIEW OF SYSTEMS: Arly.Keller ] denotes positive finding; [  ] denotes negative finding  CARDIOVASCULAR:  [ ]  chest pain   [ ]  chest pressure   [ ]  palpitations   [ ]  orthopnea   [ ]  dyspnea on exertion   Arly.Keller ] claudication   [ ]  rest pain   [ ]  DVT   [ ]  phlebitis PULMONARY:   [ ]  productive cough   [ ]  asthma   [ ]  wheezing NEUROLOGIC:   [ ]  weakness  [ ]  paresthesias  [ ]  aphasia  [ ]  amaurosis  [ ]  dizziness HEMATOLOGIC:   [ ]  bleeding problems   [ ]  clotting disorders MUSCULOSKELETAL:  [ ]  joint pain   [ ]  joint swelling Arly.Keller ] leg swelling GASTROINTESTINAL: [ ]   blood in stool  [ ]   hematemesis GENITOURINARY:  [ ]   dysuria  [ ]   hematuria PSYCHIATRIC:  [ ]  history of major depression INTEGUMENTARY:  [ ]  rashes  [ ]  ulcers CONSTITUTIONAL:  [ ]  fever   [ ]  chills  PHYSICAL EXAM: Filed Vitals:   07/25/13 1039  BP: 140/81  Pulse: 74  Height: 5\' 11"  (1.803 m)  Weight: 142 lb (64.411 kg)  SpO2: 100%   Body mass index is 19.81 kg/(m^2). GENERAL: The patient is a well-nourished male, in no acute distress. The vital signs are documented above. CARDIOVASCULAR: There is a regular rate and rhythm.  PULMONARY: There is good air exchange bilaterally without wheezing or rales. He has a palpable right femoral pulse. I cannot palpate pedal pulses. The right great toe amputation site is healed.  MEDICAL ISSUES:  Atherosclerosis of native arteries of the extremities with ulceration(440.23) He has mild popliteal artery occlusive disease. His toe amputation site is healed. Given his diabetes and smoking history I  have ordered follow up ABIs in one year now see him back at that time. We have discussed the importance of tobacco cessation. I've encouraged him to stay as active as possible. We have discussed taking good care of his feet to prevent future wounds. He will call sooner if he has problems.    Return in about 1 year (around 07/26/2014).  Chuck Hinthristopher S Georgette Helmer [facility] [place] Beeper: [phone]

## 2013-07-25 NOTE — Addendum Note (Signed)
Addended by: Sharee Pimple on: 07/25/2013 01:49 PM   Modules accepted: Orders

## 2013-07-31 ENCOUNTER — Ambulatory Visit: Payer: Self-pay | Admitting: Endocrinology

## 2013-08-09 ENCOUNTER — Ambulatory Visit: Payer: Self-pay | Admitting: Endocrinology

## 2013-08-09 DIAGNOSIS — Z0289 Encounter for other administrative examinations: Secondary | ICD-10-CM

## 2014-02-07 ENCOUNTER — Encounter (HOSPITAL_COMMUNITY): Payer: Self-pay | Admitting: Vascular Surgery

## 2014-03-14 ENCOUNTER — Encounter (HOSPITAL_COMMUNITY): Payer: Self-pay | Admitting: Orthopedic Surgery

## 2014-07-02 ENCOUNTER — Telehealth: Payer: Self-pay | Admitting: *Deleted

## 2014-07-02 NOTE — Telephone Encounter (Signed)
Patient called in to our medical records desk requesting information about his diagnosis. The call was transferred to my phone to handle. I called the patient back and told him that he could sign a release of information at his next appt with Dr. Edilia Boickson on 07-31-14. If he has any question he can call medical records back regarding release and HIPAA guidelines for this.

## 2014-07-31 ENCOUNTER — Encounter (HOSPITAL_COMMUNITY): Payer: Self-pay

## 2014-07-31 ENCOUNTER — Ambulatory Visit: Payer: Self-pay | Admitting: Vascular Surgery

## 2014-08-12 ENCOUNTER — Encounter: Payer: Self-pay | Admitting: Vascular Surgery

## 2014-08-15 ENCOUNTER — Encounter (HOSPITAL_COMMUNITY): Payer: Self-pay

## 2014-08-15 ENCOUNTER — Ambulatory Visit: Payer: Self-pay | Admitting: Vascular Surgery

## 2014-08-26 ENCOUNTER — Other Ambulatory Visit: Payer: Self-pay

## 2014-09-09 ENCOUNTER — Encounter: Payer: Self-pay | Admitting: Vascular Surgery

## 2014-09-11 ENCOUNTER — Ambulatory Visit: Payer: Self-pay | Admitting: Vascular Surgery

## 2014-09-11 ENCOUNTER — Encounter (HOSPITAL_COMMUNITY): Payer: Self-pay

## 2014-10-22 ENCOUNTER — Encounter: Payer: Self-pay | Admitting: Vascular Surgery

## 2014-10-23 ENCOUNTER — Ambulatory Visit: Payer: Self-pay | Admitting: Vascular Surgery

## 2014-10-23 ENCOUNTER — Encounter (HOSPITAL_COMMUNITY): Payer: Self-pay

## 2014-12-26 ENCOUNTER — Other Ambulatory Visit: Payer: Self-pay | Admitting: *Deleted

## 2014-12-26 DIAGNOSIS — L819 Disorder of pigmentation, unspecified: Secondary | ICD-10-CM

## 2015-01-01 ENCOUNTER — Encounter (HOSPITAL_COMMUNITY): Payer: Self-pay

## 2015-01-08 ENCOUNTER — Ambulatory Visit: Payer: Self-pay | Admitting: Vascular Surgery

## 2015-01-09 ENCOUNTER — Other Ambulatory Visit: Payer: Self-pay | Admitting: Vascular Surgery

## 2015-01-09 ENCOUNTER — Ambulatory Visit (HOSPITAL_COMMUNITY)
Admission: RE | Admit: 2015-01-09 | Discharge: 2015-01-09 | Disposition: A | Discharge status: Home / Self Care | Payer: Self-pay | Source: Ambulatory Visit | Attending: Vascular Surgery | Admitting: Vascular Surgery

## 2015-01-09 DIAGNOSIS — L819 Disorder of pigmentation, unspecified: Secondary | ICD-10-CM | POA: Insufficient documentation

## 2015-01-09 DIAGNOSIS — I739 Peripheral vascular disease, unspecified: Secondary | ICD-10-CM | POA: Insufficient documentation

## 2015-01-13 ENCOUNTER — Encounter: Payer: Self-pay | Admitting: Vascular Surgery

## 2015-01-15 ENCOUNTER — Encounter: Payer: Self-pay | Admitting: Vascular Surgery

## 2015-01-15 ENCOUNTER — Ambulatory Visit (INDEPENDENT_AMBULATORY_CARE_PROVIDER_SITE_OTHER): Payer: Self-pay | Admitting: Vascular Surgery

## 2015-01-15 VITALS — BP 131/85 | HR 55 | Ht 71.0 in | Wt 138.0 lb

## 2015-01-15 DIAGNOSIS — I739 Peripheral vascular disease, unspecified: Secondary | ICD-10-CM

## 2015-01-15 NOTE — Progress Notes (Signed)
HISTORY AND PHYSICAL     CC:  Follow up and discolored toes ~ 2 months ago (resolved) Referring Provider:  Andreas Blower., MD  HPI: This is a 34 y.o. male who is type I diabetic who had a non healing foot wound about 1.5 yrs ago.  He presented to Dr. Edilia Bo for evaluation.  He had a previous arteriogram 11/06/12, which revealed On the right side, he had some very mild disease in the above-knee popliteal artery and no significant tibial artery occlusive disease except for some mild disease in the proximal anterior tibial artery. On the left side, which is the symptomatic side, the common femoral and proximal superficial femoral artery patent. The deep femoral artery is patent. There is diffuse calcific disease of the above-knee popliteal artery and in the popliteal artery is occluded at the level of the knee. There is reconstitution of the anterior tibial and peroneal arteries on the left. The posterior tibial artery is occluded.  He did have a right great toe amputation by Dr. Lajoyce Corners that has healed completely.  He denies any non healing wounds on his feet.   He states that a couple of months ago, he had some discoloration of his toes on the left that were purple in nature.  He states that it lasted about 24 hours and resolved.  He states he has burning in his feet and attributes this to his diabetic neuropathy.  He continues to have mild left leg claudication, however, this has improved with exercise.   He states that he has cut his smoking down to 1/2 ppd.  He states he would like to continue to quit smoking.  His wife also smokes, but she would like to quit too.   He states that he does take a baby aspirin daily.   He takes insulin for his diabetes.    Past Medical History  Diagnosis Date  . Peripheral vascular disease (HCC)   . Diabetes mellitus without complication (HCC)     takes Lantus and Novolog  . History of bronchitis     only had once in his life-3+yrs ago  . Nocturia   . Panic  attacks     hx of but no meds required  . Drainage from wound     right big toe    Past Surgical History  Procedure Laterality Date  . Mouth surgery    . Esophagogastroduodenoscopy    . Amputation Right 05/11/2013    Procedure: AMPUTATION DIGIT;  Surgeon: Nadara Mustard, MD;  Location: [facility] OR;  Service: Orthopedics;  Laterality: Right;  Right Great Toe Amputation at MTP  . Abdominal aortagram N/A 11/06/2012    Procedure: ABDOMINAL Ronny Flurry;  Surgeon: Chuck Hint, MD;  Location: [facility] CATH LAB;  Service: Cardiovascular;  Laterality: N/A;    No Known Allergies  Current Outpatient Prescriptions  Medication Sig Dispense Refill  . ALPRAZolam (XANAX) 1 MG tablet Take 1 mg by mouth.    Marland Kitchen aspirin 81 MG tablet Take 81 mg by mouth daily.    Marland Kitchen imipramine (TOFRANIL) 10 MG tablet Take 10 mg by mouth at bedtime.    . insulin aspart (NOVOLOG) 100 UNIT/ML injection Inject 2-4 Units into the skin 4 (four) times daily -  before meals and at bedtime. Uses a sliding scale.    . insulin glargine (LANTUS) 100 UNIT/ML injection Inject 15 Units into the skin at bedtime.     No current facility-administered medications for this visit.    Family History  Problem Relation Age of Onset  . Heart disease Father     before age 35  . Hypertension Mother   . Depression Mother   . GER disease Mother     Social History   Social History  . Marital Status: Single    Spouse Name: N/A  . Number of Children: N/A  . Years of Education: N/A   Occupational History  . Not on file.   Social History Main Topics  . Smoking status: Current Every Day Smoker -- 0.50 packs/day for 14 years    Types: Cigarettes  . Smokeless tobacco: Never Used  . Alcohol Use: 0.0 oz/week     Comment: occaionally  . Drug Use: No  . Sexual Activity: Not on file   Other Topics Concern  . Not on file   Social History Narrative     ROS: [x]  Positive   [ ]  Negative   [ ]  All sytems reviewed and are  negative  Cardiovascular: []  chest pain/pressure []  palpitations []  SOB lying flat []  DOE [x]  pain in left calf while walking []  pain in feet when lying flat []  hx of DVT []  hx of phlebitis []  swelling in legs []  varicose veins  Pulmonary: []  productive cough []  asthma []  wheezing  Neurologic: []  weakness in []  arms []  legs []  numbness in []  arms []  legs [] difficulty speaking or slurred speech []  temporary loss of vision in one eye []  dizziness  Hematologic: []  bleeding problems []  problems with blood clotting easily  GI []  vomiting blood []  blood in stool  GU: []  burning with urination []  blood in urine  Psychiatric: []  hx of major depression  Integumentary: []  rashes []  ulcers  Constitutional: []  fever []  chills   PHYSICAL EXAMINATION:  Filed Vitals:   01/15/15 1515  BP: 131/85  Pulse: 55   Body mass index is 19.26 kg/(m^2).  General:  WDWN in NAD Gait: Normal HENT: WNL, normocephalic Pulmonary: normal non-labored breathing , without Rales, rhonchi,  wheezing Cardiac: RRR Abdomen: soft, NT, no masses Skin: without rashes Vascular Exam/Pulses:  Right Left  Radial 2+ (normal) 2+ (normal)  Femoral 2+ (normal) 1+ (weak)  Popliteal Unable to palpate  Unable to palpate   DP Unable to palpate  Unable to palpate   PT 1+ (weak) Unable to palpate    Extremities: without ischemic changes, without Gangrene , without cellulitis; without open wounds;  Musculoskeletal: no muscle wasting or atrophy  Neurologic: A&O X 3; Appropriate Affect ; SENSATION: normal; MOTOR FUNCTION:  moving all extremities equally. Speech is fluent/normal   Non-Invasive Vascular Imaging:   ABI's 01/09/15 Right:  0.89 Left:  0.68  TBI right: absent due to amputation TBI left:  < 0.69  Previous ABI's 04/25/14:  Right:  0.85 Left:  0.56  Left TBI:  0.37  Pt meds includes: Statin:  No. Beta Blocker:  No. Aspirin:  Yes.   ACEI:  No. ARB:  No. Other  Antiplatelet/Anticoagulant:  No.    ASSESSMENT/PLAN:: 34 y.o. male with PAD in setting of type one diabetes   -pt's left leg claudication has improved since last visit with increased exercise.  His ABI on the right is essentially unchanged, however, the left has improved.  -He continues to try to quit smoking and is down to 1/2 ppd.  Had discussion with pt about 1-800-QUIT-NOW and it is vital in the setting of his diabetes to quit smoking and could eventually be at risk for further amputations.  He states he  hopes he and his wife can quit together. -The discoloration he experienced a couple of months ago lasted < 24hrs and resolved and he has not had any further issues with this.   -will schedule him for ABI's in one year.  Discussed with pt he he develops any non healing wounds, we would need to see him sooner than one year.    Doreatha Massed, PA-C [facility] [phone]  Clinic MD:  Pt seen and examined in conjunction with Dr. Edilia Bo

## 2015-01-16 ENCOUNTER — Encounter: Payer: Self-pay | Admitting: Internal Medicine

## 2015-01-16 NOTE — Addendum Note (Signed)
Addended by: Adria DillELDRIDGE-LEWIS, Kattleya Kuhnert L on: 01/16/2015 12:52 PM   Modules accepted: Orders

## 2015-07-02 DIAGNOSIS — Z0279 Encounter for issue of other medical certificate: Secondary | ICD-10-CM | POA: Diagnosis not present

## 2015-12-10 ENCOUNTER — Emergency Department (HOSPITAL_COMMUNITY): Payer: PRIVATE HEALTH INSURANCE

## 2015-12-10 ENCOUNTER — Encounter (HOSPITAL_COMMUNITY): Payer: Self-pay | Admitting: Emergency Medicine

## 2015-12-10 DIAGNOSIS — Z794 Long term (current) use of insulin: Secondary | ICD-10-CM | POA: Insufficient documentation

## 2015-12-10 DIAGNOSIS — R079 Chest pain, unspecified: Secondary | ICD-10-CM | POA: Insufficient documentation

## 2015-12-10 DIAGNOSIS — E119 Type 2 diabetes mellitus without complications: Secondary | ICD-10-CM | POA: Insufficient documentation

## 2015-12-10 DIAGNOSIS — Z5321 Procedure and treatment not carried out due to patient leaving prior to being seen by health care provider: Secondary | ICD-10-CM | POA: Insufficient documentation

## 2015-12-10 DIAGNOSIS — F17210 Nicotine dependence, cigarettes, uncomplicated: Secondary | ICD-10-CM | POA: Insufficient documentation

## 2015-12-10 DIAGNOSIS — Z7982 Long term (current) use of aspirin: Secondary | ICD-10-CM | POA: Insufficient documentation

## 2015-12-10 LAB — CBC
HCT: 40 % (ref 39.0–52.0)
Hemoglobin: 13.9 g/dL (ref 13.0–17.0)
MCH: 32.3 pg (ref 26.0–34.0)
MCHC: 34.8 g/dL (ref 30.0–36.0)
MCV: 92.8 fL (ref 78.0–100.0)
PLATELETS: 217 10*3/uL (ref 150–400)
RBC: 4.31 MIL/uL (ref 4.22–5.81)
RDW: 12.2 % (ref 11.5–15.5)
WBC: 9.1 10*3/uL (ref 4.0–10.5)

## 2015-12-10 LAB — I-STAT TROPONIN, ED: TROPONIN I, POC: 0.01 ng/mL (ref 0.00–0.08)

## 2015-12-10 LAB — BASIC METABOLIC PANEL
Anion gap: 12 (ref 5–15)
BUN: <5 mg/dL — ABNORMAL LOW (ref 6–20)
CALCIUM: 9.4 mg/dL (ref 8.9–10.3)
CO2: 29 mmol/L (ref 22–32)
CREATININE: 0.62 mg/dL (ref 0.61–1.24)
Chloride: 94 mmol/L — ABNORMAL LOW (ref 101–111)
GFR calc non Af Amer: >60 mL/min (ref >60)
Glucose, Bld: 207 mg/dL — ABNORMAL HIGH (ref 65–99)
Potassium: 4 mmol/L (ref 3.5–5.1)
SODIUM: 135 mmol/L (ref 135–145)

## 2015-12-10 NOTE — ED Triage Notes (Signed)
Patient with chest pain for the last few weeks with shortness of breath.  Patient denies any nausea or vomiting.  No diaphoresis.  Patient states that the pain was getting worse so he came in tonight.

## 2015-12-11 ENCOUNTER — Emergency Department (HOSPITAL_COMMUNITY)
Admission: EM | Admit: 2015-12-11 | Discharge: 2015-12-11 | Disposition: A | Discharge status: Home / Self Care | Payer: PRIVATE HEALTH INSURANCE | Attending: Emergency Medicine | Admitting: Emergency Medicine

## 2015-12-11 NOTE — ED Notes (Signed)
Pt called several times with no answer 

## 2016-01-21 ENCOUNTER — Encounter (HOSPITAL_COMMUNITY): Payer: Self-pay

## 2016-01-21 ENCOUNTER — Ambulatory Visit: Payer: Self-pay | Admitting: Vascular Surgery

## 2016-01-21 DIAGNOSIS — Z0279 Encounter for issue of other medical certificate: Secondary | ICD-10-CM

## 2016-02-26 ENCOUNTER — Encounter: Payer: Self-pay | Admitting: Vascular Surgery

## 2016-03-10 ENCOUNTER — Ambulatory Visit (HOSPITAL_COMMUNITY): Admission: RE | Admit: 2016-03-10 | Payer: Self-pay | Source: Ambulatory Visit

## 2016-03-10 ENCOUNTER — Ambulatory Visit: Payer: Self-pay | Admitting: Vascular Surgery

## 2016-04-27 ENCOUNTER — Encounter: Payer: Self-pay | Admitting: Vascular Surgery

## 2016-05-05 ENCOUNTER — Ambulatory Visit: Payer: Self-pay | Admitting: Vascular Surgery

## 2016-05-05 ENCOUNTER — Encounter (HOSPITAL_COMMUNITY): Payer: Self-pay

## 2016-05-18 ENCOUNTER — Other Ambulatory Visit: Payer: Self-pay

## 2016-05-18 DIAGNOSIS — I739 Peripheral vascular disease, unspecified: Secondary | ICD-10-CM

## 2016-06-23 ENCOUNTER — Encounter: Payer: Self-pay | Admitting: Vascular Surgery

## 2016-06-30 ENCOUNTER — Inpatient Hospital Stay (HOSPITAL_COMMUNITY): Admission: RE | Admit: 2016-06-30 | Payer: PRIVATE HEALTH INSURANCE | Source: Ambulatory Visit

## 2016-06-30 ENCOUNTER — Ambulatory Visit: Payer: Self-pay | Admitting: Vascular Surgery

## 2016-08-02 ENCOUNTER — Emergency Department (HOSPITAL_COMMUNITY)
Admission: EM | Admit: 2016-08-02 | Discharge: 2016-08-02 | Disposition: A | Discharge status: Home / Self Care | Payer: Medicaid Other | Attending: Emergency Medicine | Admitting: Emergency Medicine

## 2016-08-02 ENCOUNTER — Encounter (HOSPITAL_COMMUNITY): Payer: Self-pay | Admitting: *Deleted

## 2016-08-02 DIAGNOSIS — Z79899 Other long term (current) drug therapy: Secondary | ICD-10-CM | POA: Insufficient documentation

## 2016-08-02 DIAGNOSIS — R590 Localized enlarged lymph nodes: Secondary | ICD-10-CM

## 2016-08-02 DIAGNOSIS — M79604 Pain in right leg: Secondary | ICD-10-CM

## 2016-08-02 DIAGNOSIS — E109 Type 1 diabetes mellitus without complications: Secondary | ICD-10-CM | POA: Diagnosis not present

## 2016-08-02 DIAGNOSIS — Y929 Unspecified place or not applicable: Secondary | ICD-10-CM | POA: Diagnosis not present

## 2016-08-02 DIAGNOSIS — Z7982 Long term (current) use of aspirin: Secondary | ICD-10-CM | POA: Insufficient documentation

## 2016-08-02 DIAGNOSIS — Y939 Activity, unspecified: Secondary | ICD-10-CM | POA: Insufficient documentation

## 2016-08-02 DIAGNOSIS — Y999 Unspecified external cause status: Secondary | ICD-10-CM | POA: Insufficient documentation

## 2016-08-02 DIAGNOSIS — W57XXXA Bitten or stung by nonvenomous insect and other nonvenomous arthropods, initial encounter: Secondary | ICD-10-CM | POA: Diagnosis not present

## 2016-08-02 DIAGNOSIS — S2096XA Insect bite (nonvenomous) of unspecified parts of thorax, initial encounter: Secondary | ICD-10-CM | POA: Insufficient documentation

## 2016-08-02 DIAGNOSIS — R6883 Chills (without fever): Secondary | ICD-10-CM | POA: Insufficient documentation

## 2016-08-02 DIAGNOSIS — F17210 Nicotine dependence, cigarettes, uncomplicated: Secondary | ICD-10-CM | POA: Insufficient documentation

## 2016-08-02 MED ORDER — DOXYCYCLINE HYCLATE 100 MG PO CAPS: 100.0000 mg | ORAL_CAPSULE | Freq: Two times a day (BID) | ORAL | 0 refills | Status: AC

## 2016-08-02 NOTE — ED Provider Notes (Signed)
[facility]-EMERGENCY DEPT Provider Note   CSN: [id number] Arrival date & time: 08/02/16  1543  By signing my name below, I, Cynda AcresHailei Fulton, attest that this documentation has been prepared under the direction and in the presence of  [address] Tashena Ibach, VF CorporationPA-C. Electronically Signed: Cynda AcresHailei Fulton, Scribe. 08/02/16. 4:13 PM.  History   Chief Complaint Chief Complaint  Patient presents with  . Leg Pain  . Tick Removal  . Chills     HPI Comments: Jay Barber is a 36 y.o. male with a history of DM2 and PVD, who presents to the Emergency Department complaining of sudden-onset, persistent right groin/inner thigh itching/burning/pain that began 2 days ago. Patient states he was bit by a tick 4 days ago, which was removed by him. The tick was embedded in the left upper back, but he states he removed the entire tick. Then 2 days ago he started having R groin/thigh pain/itching, and today he developed chills, fatigue, and generalized body aches. No rashes noted. Patient describes the R groin/thigh pain as 5/10 constant burning and itching non-radiating right thigh pain; Patient has not tried any medications, aggravated by palpation.  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 Patient denies any rashes, known fevers, cough/URI symptoms, CP, SOB, abdominal pain, N/V/D/C, dysuria, hematuria, penile discharge, testicular pain/swelling, HA, vision changes, neck pain/stiffness, numbness, tingling, focal weakness, or any other complaints at this time.   The history is provided by the patient and medical records. No language interpreter was used.  Leg Pain   This is a new problem. The current episode started 2 days ago. The problem occurs constantly. The problem has not changed since onset.The pain is present in the right upper leg. Quality: burning itching. The pain is at a severity of 5/10. The pain is mild. Associated symptoms include itching. Pertinent negatives include no  numbness, full range of motion and no tingling. The symptoms are aggravated by contact. He has tried nothing for the symptoms. The treatment provided no relief. There has been no history of extremity trauma.    Past Medical History:  Diagnosis Date  . Diabetes mellitus without complication (HCC)    takes Lantus and Novolog  . Drainage from wound    right big toe  . History of bronchitis    only had once in his life-3+yrs ago  . Nocturia   . Panic attacks    hx of but no meds required  . Peripheral vascular disease [facility](HCC)     Patient Active Problem List   Diagnosis Date Noted  . Atherosclerosis of native arteries of the extremities with ulceration(440.23) 03/14/2013  . LEUKOCYTOSIS 02/02/2008  . ALCOHOL USE 02/02/2008  . HEPATIC DISEASE 02/02/2008  . SMOKER 08/23/2007  . COUGH 08/23/2007  . DIABETES MELLITUS, TYPE I 02/01/2007  . INSOMNIA-SLEEP DISORDER-UNSPEC 02/01/2007  . URI 02/01/2007  . ANXIETY 09/29/2006  . HYPERTENSION 09/29/2006  . GERD 09/29/2006  . PULPITIS 10/15/2003  . 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 ABDOMINAL AORTAGRAM N/A 11/06/2012   Procedure: ABDOMINAL Ronny FlurryAORTAGRAM;  Surgeon: Chuck Hinthristopher S Dickson, MD;  [facility];  Service: Cardiovascular;  Laterality: N/A;  . AMPUTATION Right 05/11/2013   Procedure: AMPUTATION DIGIT;  Surgeon: Nadara MustardMarcus V Duda, MD;  Location: [facility] OR;  Service: Orthopedics;  Laterality: Right;  Right Great Toe Amputation at MTP  . ESOPHAGOGASTRODUODENOSCOPY    . MOUTH SURGERY  Home Medications    Prior to Admission medications   Medication Sig Start Date End Date Taking? Authorizing Provider  imipramine (TOFRANIL) 10 MG tablet Take 10 mg by mouth at bedtime.   Yes [provider]  ALPRAZolam Prudy Feeler) 1 MG tablet Take 1 mg by mouth. 02/21/14   [provider]  aspirin 81 MG tablet Take 81 mg by mouth daily.    [provider]  insulin  aspart (NOVOLOG) 100 UNIT/ML injection Inject 2-4 Units into the skin 4 (four) times daily -  before meals and at bedtime. Uses a sliding scale.    [provider]  insulin glargine (LANTUS) 100 UNIT/ML injection Inject 15 Units into the skin at bedtime.    [provider]    Family History Family History  Problem Relation Age of Onset  . Heart disease Father        before age 32  . Hypertension Mother   . Depression Mother   . GER disease Mother     Social History Social History  Substance Use Topics  . Smoking status: Current Every Day Smoker    Packs/day: 1.00    Years: 14.00    Types: Cigarettes  . Smokeless tobacco: Never Used  . Alcohol use 0.0 oz/week     Comment: occaionally     Allergies   Patient has no known allergies.   Review of Systems Review of Systems  Constitutional: Positive for chills and fatigue. Negative for fever.  HENT: Negative for rhinorrhea and sore throat.   Eyes: Negative for visual disturbance.  Respiratory: Negative for cough and shortness of breath.   Cardiovascular: Negative for chest pain.  Gastrointestinal: Negative for abdominal pain, constipation, diarrhea, nausea and vomiting.  Genitourinary: Negative for discharge, dysuria, hematuria, scrotal swelling and testicular pain.  Musculoskeletal: Positive for myalgias (R groin/thigh). Negative for arthralgias, neck pain and neck stiffness.  Skin: Positive for itching and wound (tick bite, removed himself). Negative for color change and rash.  Allergic/Immunologic: Positive for immunocompromised state (DM2).  Neurological: Negative for tingling, weakness, numbness and headaches.  Psychiatric/Behavioral: Negative for confusion.  All other systems reviewed and are negative for acute change except as noted in the HPI.     Physical Exam Updated Vital Signs BP (!) 157/96 (BP Location: Right Arm)   Pulse (!) 104   Temp 99.8 F (37.7 C) (Oral)   Resp 20   Ht 5\' 11"  (1.803  m)   Wt 150 lb (68 kg)   SpO2 99%   BMI 20.92 kg/m   Physical Exam  Constitutional: He is oriented to person, place, and time. Vital signs are normal. He appears well-developed and well-nourished.  Non-toxic appearance. No distress.  Low grade temp 99.8, nontoxic, NAD  HENT:  Head: Normocephalic and atraumatic.  Mouth/Throat: Mucous membranes are normal.  Eyes: Conjunctivae and EOM are normal. Right eye exhibits no discharge. Left eye exhibits no discharge.  Neck: Normal range of motion. Neck supple.  No meningismus.  Cardiovascular: Intact distal pulses.  Tachycardia present.   Mildly tachycardic in the low 100s.   Pulmonary/Chest: Effort normal. No respiratory distress.  Abdominal: Normal appearance. He exhibits no distension.  Genitourinary: Testes normal. Right testis shows no mass, no swelling and no tenderness. Left testis shows no mass, no swelling and no tenderness.  Genitourinary Comments: Chaperone present for exam Genital area without erythema, rash, or lesions. Testes with no masses or tenderness, no swelling. Right inguinal adenopathy present, which is mildly TTP.  Musculoskeletal: Normal range of motion.  Lymphadenopathy: Inguinal adenopathy noted on the right side.  Neurological: He is alert and oriented to person, place, and time. He has normal strength. No sensory deficit.  Skin: Skin is warm, dry and intact. No rash noted.  No rash over all exposed areas of the torso and extremities, no rash to the groin or genital area, no embedded ticks over all exposed surfaces. No wounds.  Chaperone present throughout entire exam.   Psychiatric: He has a normal mood and affect.  Nursing note and vitals reviewed.    ED Treatments / Results  DIAGNOSTIC STUDIES: Oxygen Saturation is 99% on RA, normal by my interpretation.    COORDINATION OF CARE: 4:13 PM Discussed treatment plan with pt at bedside and pt agreed to plan, which includes an antibiotic, warm compresses, and  ibuprofen.   Labs (all labs ordered are listed, but only abnormal results are displayed) Labs Reviewed - No data to display  EKG  EKG Interpretation None       Radiology No results found.  Procedures Procedures (including critical care time)  Medications Ordered in ED Medications - No data to display   Initial Impression / Assessment and Plan / ED Course  I have reviewed the triage vital signs and the nursing notes.  Pertinent labs & imaging results that were available during my care of the patient were reviewed by me and considered in my medical decision making (see chart for details).     36 y.o. male here with R leg pain/itching, chills, body aches after removal of tick from back 2 days before. On exam, no rashes noted, R inguinal LAD which is tender, no ticks seen and no bite marks noted. Temp 99.8, HR 104, but overall nontoxic appearing. Will cover for RMSF empirically. Will hold off on labs since the IgG and IgM won't usually appear in the first 5-10 days. Will have him f/up with PCP in 5 days for recheck and to get this lab work done. Advised tylenol/motrin/heat for pain. I explained the diagnosis and have given explicit precautions to return to the ER including for any other new or worsening symptoms. The patient understands and accepts the medical plan as it's been dictated and I have answered their questions. Discharge instructions concerning home care and prescriptions have been given. The patient is STABLE and is discharged to home in good condition.   I personally performed the services described in this documentation, which was scribed in my presence. The recorded information has been reviewed and is accurate.    Final Clinical Impressions(s) / ED Diagnoses   Final diagnoses:  Tick bite, initial encounter  Chills  Inguinal adenopathy  Right leg pain    New Prescriptions New Prescriptions   DOXYCYCLINE (VIBRAMYCIN) 100 MG CAPSULE    Take 1 capsule (100 mg  total) by mouth 2 (two) times daily.     [address] [place], [place] 08/02/16 1629    Little, Ambrose Finland, MD 08/04/16 0700

## 2016-08-02 NOTE — Discharge Instructions (Signed)
Apply warm compresses to affected area throughout the day. Take antibiotic until it is finished. Alternate between tylenol and motrin as needed for pain or fever. Followup with your Primary Care doctor in 5 days for recheck and for ongoing management of your symptoms; they may also get the lab work at that time in order to see if this was rocky mountain spotted fever. Return to emergency department for emergent changing or worsening symptoms.

## 2016-08-02 NOTE — ED Triage Notes (Signed)
Right groin started itching last Saturday, there is a bump present. Burning feeling, chills, body aches and very tired.

## 2016-08-23 ENCOUNTER — Ambulatory Visit (INDEPENDENT_AMBULATORY_CARE_PROVIDER_SITE_OTHER): Payer: Medicaid Other | Admitting: Orthopedic Surgery

## 2016-08-23 ENCOUNTER — Encounter (INDEPENDENT_AMBULATORY_CARE_PROVIDER_SITE_OTHER): Payer: Self-pay | Admitting: Orthopedic Surgery

## 2016-08-23 DIAGNOSIS — L84 Corns and callosities: Secondary | ICD-10-CM | POA: Diagnosis not present

## 2016-08-23 DIAGNOSIS — Z89411 Acquired absence of right great toe: Secondary | ICD-10-CM

## 2016-08-23 DIAGNOSIS — L97509 Non-pressure chronic ulcer of other part of unspecified foot with unspecified severity: Secondary | ICD-10-CM | POA: Diagnosis not present

## 2016-08-23 DIAGNOSIS — E10621 Type 1 diabetes mellitus with foot ulcer: Secondary | ICD-10-CM

## 2016-08-23 DIAGNOSIS — IMO0002 Reserved for concepts with insufficient information to code with codable children: Secondary | ICD-10-CM | POA: Insufficient documentation

## 2016-08-23 NOTE — Progress Notes (Signed)
Office Visit Note   Patient: Jay Barber           Date of Birth: March 04, 1980           MRN: [id number] Visit Date: 08/23/2016              Requested by: Andreas Blower., MD [address] [place], [place] [address] PCP: Andreas Blower., MD  Chief Complaint  Patient presents with  . Right Foot - Wound Check  . Left Foot - Wound Check      HPI: The patient is a 36 year old gentleman who presents today for initial evaluation of calluses to his feet bilaterally. He's concerned for infection. Likes to know what he can do to prevent these calluses from building up. Complains they're painful. He does have a history of a right great toe amputation due to O diabetic foot ulcer. Has been soaking his foot in peroxide in Epsom salts for the last 2 weeks. Is applying Neosporin to the calluses daily.  Assessment & Plan: Visit Diagnoses:  1. Pre-ulcerative calluses   2. 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 Great toe amputation status, right (HCC)     Plan: Recommended heel cord stretching and arch support. Will continue with daily foot checks. Follow up in office as needed.   Follow-Up Instructions: Return in about 4 weeks (around 09/20/2016), or if symptoms worsen or fail to improve.   Ortho Exam  Patient is alert, oriented, no adenopathy, well-dressed, normal affect, normal respiratory effort. Bilateral has callus buildup beneath 5th MT heads.  No erythema. No wounds. No depth. This is pared with a 10 blade back to viable tissue. There is no drainage. No sign of infection. Has dorsiflexion to 90 degrees, heel cord tightness.  Imaging: No results found.  Labs: Lab Results  Component Value Date   HGBA1C 9.6 (H) 04/15/2009   HGBA1C 9.5 (H) 06/14/2008   HGBA1C 9.5 (H) 04/04/2007   REPTSTATUS 01/19/2008 FINAL 01/18/2008   CULT NO GROWTH 01/18/2008    Orders:  No orders of the defined types were placed in this encounter.  No orders of the defined types  were placed in this encounter.    Procedures: No procedures performed  Clinical Data: No additional findings.  ROS:  All other systems negative, except as noted in the HPI. Review of Systems  Constitutional: Negative for chills and fever.  Skin: Negative for color change and wound.    Objective: Vital Signs: There were no vitals taken for this visit.  Specialty Comments:  No specialty comments available.  PMFS History: Patient Active Problem List   Diagnosis Date Noted  . Great toe amputation status, right (HCC) 08/23/2016  . Atherosclerosis of native arteries of the extremities with ulceration(440.23) 03/14/2013  . LEUKOCYTOSIS 02/02/2008  . ALCOHOL USE 02/02/2008  . HEPATIC DISEASE 02/02/2008  . SMOKER 08/23/2007  . COUGH 08/23/2007  . Type 1 diabetes mellitus (HCC) 02/01/2007  . INSOMNIA-SLEEP DISORDER-UNSPEC 02/01/2007  . URI 02/01/2007  . ANXIETY 09/29/2006  . HYPERTENSION 09/29/2006  . GERD 09/29/2006  . PULPITIS 10/15/2003  . CHRONIC APICAL PERIODONTITIS 10/15/2003   Past Medical History:  Diagnosis Date  . Diabetes mellitus without complication (HCC)    takes Lantus and Novolog  . Drainage from wound    right big toe  . History of bronchitis    only had once in his life-3+yrs ago  . Nocturia   . Panic attacks  hx of but no meds required  . Peripheral vascular disease (HCC)     Family History  Problem Relation Age of Onset  . Heart disease Father        before age 37  . Hypertension Mother   . Depression Mother   . GER disease Mother     Past Surgical History:  Procedure Laterality Date  . ABDOMINAL AORTAGRAM N/A 11/06/2012   Procedure: ABDOMINAL Ronny FlurryAORTAGRAM;  Surgeon: Chuck Hinthristopher S Dickson, MD;  [facility];  Service: Cardiovascular;  Laterality: N/A;  . AMPUTATION Right 05/11/2013   Procedure: AMPUTATION DIGIT;  Surgeon: Nadara MustardMarcus V Duda, MD;  Location: [facility] OR;  Service: Orthopedics;  Laterality: Right;  Right Great Toe Amputation at [facility]   . ESOPHAGOGASTRODUODENOSCOPY    . MOUTH SURGERY     Social History   Occupational History  . Not on file.   Social History Main Topics  . Smoking status: Current Every Day Smoker    Packs/day: 1.00    Years: 14.00    Types: Cigarettes  . Smokeless tobacco: Never Used  . Alcohol use 0.0 oz/week     Comment: occaionally  . Drug use: No  . Sexual activity: Not on file

## 2016-09-20 ENCOUNTER — Encounter: Payer: Self-pay | Admitting: Vascular Surgery

## 2016-09-29 ENCOUNTER — Ambulatory Visit (HOSPITAL_COMMUNITY)
Admission: RE | Admit: 2016-09-29 | Discharge: 2016-09-29 | Disposition: A | Discharge status: Home / Self Care | Payer: Medicaid Other | Source: Ambulatory Visit | Attending: Vascular Surgery | Admitting: Vascular Surgery

## 2016-09-29 ENCOUNTER — Ambulatory Visit (INDEPENDENT_AMBULATORY_CARE_PROVIDER_SITE_OTHER): Payer: Medicaid Other | Admitting: Vascular Surgery

## 2016-09-29 ENCOUNTER — Encounter: Payer: Self-pay | Admitting: Vascular Surgery

## 2016-09-29 VITALS — BP 130/85 | HR 91 | Temp 97.9°F | Resp 16 | Ht 71.0 in | Wt 134.0 lb

## 2016-09-29 DIAGNOSIS — I739 Peripheral vascular disease, unspecified: Secondary | ICD-10-CM

## 2016-09-29 NOTE — Progress Notes (Signed)
Patient name: Jay BlazerJason B Barber MRN: [id number] DOB: June 16, 1980 Sex: male  REASON FOR VISIT:    Follow up  HPI:   Jay Barber is a pleasant 36 y.o. male who I last saw 1 year ago. He had dropped something on his right foot and developed a wound on the right great toe. ABI on the right was 85%. Arteriogram showed some mild disease in his above knee popliteal artery with no significant tibial artery occlusive disease except for some mild disease in the proximal anterior tibial artery. He underwent a right great toe amputation by Dr. Lajoyce Cornersuda. When I saw him last the toe amputation site was healed. We discussed the importance of tobacco cessation and I was to see him back in 1 year.   Since I saw him last, his only complaint is left calf claudication. This occurs approximately 100 yards. His pain is brought on by ambulation and relieved with rest. There are no other aggravating or alleviating factors. He has no thigh or hip claudication. He has no symptoms in the right leg. He denies any history of rest pain or nonhealing ulcers.  He has cut back to half a pack per day of cigarettes.  Past Medical History:  Diagnosis Date  . Diabetes mellitus without complication (HCC)    takes Lantus and Novolog  . Drainage from wound    right big toe  . History of bronchitis    only had once in his life-3+yrs ago  . Nocturia   . Panic attacks    hx of but no meds required  . Peripheral vascular disease (HCC)     Family History  Problem Relation Age of Onset  . Heart disease Father        before age 37  . Hypertension Mother   . Depression Mother   . GER disease Mother    SOCIAL HISTORY: Social History  Substance Use Topics  . Smoking status: Current Every Day Smoker    Packs/day: 0.50    Years: 14.00    Types: Cigarettes  . Smokeless tobacco: Never Used  . Alcohol use 0.0 oz/week     Comment: occaionally    No Known Allergies  Current Outpatient Prescriptions  Medication Sig Dispense  Refill  . ALPRAZolam (XANAX) 1 MG tablet Take 1 mg by mouth.    Marland Kitchen. aspirin 81 MG tablet Take 81 mg by mouth daily.    Marland Kitchen. imipramine (TOFRANIL) 10 MG tablet Take 10 mg by mouth at bedtime.    . insulin aspart (NOVOLOG) 100 UNIT/ML injection Inject 2-4 Units into the skin 4 (four) times daily -  before meals and at bedtime. Uses a sliding scale.    . insulin glargine (LANTUS) 100 UNIT/ML injection Inject 15 Units into the skin at bedtime.     No current facility-administered medications for this visit.     REVIEW OF SYSTEMS:  [X]  denotes positive finding, [ ]  denotes negative finding Cardiac  Comments:  Chest pain or chest pressure:    Shortness of breath upon exertion:    Short of breath when lying flat:    Irregular heart rhythm:        Vascular    Pain in calf, thigh, or hip brought on by ambulation: X Left calf   Pain in feet at night that wakes you up from your sleep:     Blood clot in your veins:    Leg swelling:         Pulmonary  Oxygen at home:    Productive cough:     Wheezing:         Neurologic    Sudden weakness in arms or legs:     Sudden numbness in arms or legs:     Sudden onset of difficulty speaking or slurred speech:    Temporary loss of vision in one eye:     Problems with dizziness:         Gastrointestinal    Blood in stool:     Vomited blood:         Genitourinary    Burning when urinating:     Blood in urine:        Psychiatric    Major depression:         Hematologic    Bleeding problems:    Problems with blood clotting too easily:        Skin    Rashes or ulcers:        Constitutional    Fever or chills:     PHYSICAL EXAM:   Vitals:   09/29/16 1559  BP: 130/85  Pulse: 91  Resp: 16  Temp: 97.9 F (36.6 C)  TempSrc: Oral  SpO2: 100%  Weight: 134 lb (60.8 kg)  Height: 5\' 11"  (1.803 m)   GENERAL: The patient is a well-nourished male, in no acute distress. The vital signs are documented above. CARDIAC: There is a regular rate  and rhythm.  VASCULAR: I do not detect carotid bruits. On the right side, he has a palpable femoral and posterior tibial pulse. On the left side, he has a palpable femoral pulse. I cannot palpate popliteal or pedal pulses. He has no significant lower extremity swelling. PULMONARY: There is good air exchange bilaterally without wheezing or rales. ABDOMEN: Soft and non-tender with normal pitched bowel sounds.  MUSCULOSKELETAL: He has had a previous right great toe amputation which is healed. NEUROLOGIC: No focal weakness or paresthesias are detected. SKIN: There are no ulcers or rashes noted. PSYCHIATRIC: The patient has a normal affect.  DATA:    ARTERIAL DOPPLER STUDY: I have independently interpreted his arterial Doppler study today.  On the right side there is a monophasic posterior tibial signal with a triphasic dorsalis pedis signal. ABI on the right is 77%.  On the left side, there is a monophasic posterior tibial signal with a biphasic dorsalis pedis signal. ABI on the left is 62%.  MEDICAL ISSUES:   BILATERAL INFRAINGUINAL ARTERIAL OCCLUSIVE DISEASE: This patient has stable left lower extremity claudication. I have encouraged him to stay as active as possible. We have again discussed the importance of tobacco cessation. Given that his symptoms are stable and think that it is safe to stretch his follow up out to 18 months. I have ordered follow up ABIs in 18 months and I'll see him back that time. He is to call sooner if he has problems.  Waverly Ferrariickson, [facility] [phone]

## 2017-03-16 ENCOUNTER — Telehealth: Payer: Self-pay

## 2017-03-16 NOTE — Telephone Encounter (Signed)
Returned patient call. He says he is having right leg pain just like he was having on his left leg but it is worse and feels he needs to be seen before his 15 month follow up. I have passed along to scheduling to get appointment made for patient. I told patient he will get a call from us as to the day and time of appointment.

## 2017-03-23 ENCOUNTER — Ambulatory Visit: Payer: Self-pay | Admitting: Vascular Surgery

## 2017-04-20 ENCOUNTER — Ambulatory Visit: Payer: Self-pay | Admitting: Vascular Surgery

## 2017-05-11 ENCOUNTER — Other Ambulatory Visit: Payer: Self-pay

## 2017-05-11 ENCOUNTER — Ambulatory Visit (INDEPENDENT_AMBULATORY_CARE_PROVIDER_SITE_OTHER): Payer: Medicaid Other | Admitting: Vascular Surgery

## 2017-05-11 ENCOUNTER — Encounter: Payer: Self-pay | Admitting: Vascular Surgery

## 2017-05-11 ENCOUNTER — Other Ambulatory Visit: Payer: Self-pay | Admitting: *Deleted

## 2017-05-11 ENCOUNTER — Encounter: Payer: Self-pay | Admitting: *Deleted

## 2017-05-11 VITALS — BP 111/72 | HR 96 | Temp 97.8°F | Resp 16 | Ht 71.0 in | Wt 134.0 lb

## 2017-05-11 DIAGNOSIS — I739 Peripheral vascular disease, unspecified: Secondary | ICD-10-CM | POA: Diagnosis not present

## 2017-05-11 NOTE — Progress Notes (Signed)
Patient name: Jay Barber MRN: [id number] DOB: 12-Mar-1980 Sex: male  REASON FOR VISIT:   Follow-up of peripheral vascular disease.  HPI:   Jay BlazerJason B Barber is a pleasant 37 y.o. male who has been following with peripheral vascular disease.  I last saw him on 09/29/2016.  The patient had stable left lower extremity claudication.  We discussed the importance of tobacco cessation.  I was going to stretch his follow-up out to 18 months.  However his symptoms have worsened that he presents for follow-up.  The patient has had stable left calf claudication with no significant symptoms on the right side.  He [place] the zoo in [place] and spent the entire day walking.  After that he noticed significant left calf claudication which was new.  The symptoms have persisted since then.  He experiences pain in both calves which is brought on by ambulation and relieved with rest.  He denies any history of rest pain.  He did develop a small wound on the dorsum of his right foot yesterday.  This seems to be related to a small blister.  He is cut back significantly on tobacco and is gone from 2 packs a day to half a pack per day.  There have been no other significant changes in his medical history.  Past Medical History:  Diagnosis Date  . Diabetes mellitus without complication (HCC)    takes Lantus and Novolog  . Drainage from wound    right big toe  . History of bronchitis    only had once in his life-3+yrs ago  . Nocturia   . Panic attacks    hx of but no meds required  . Peripheral vascular disease (HCC)     Family History  Problem Relation Age of Onset  . Heart disease Father        before age 38  . Hypertension Mother   . Depression Mother   . GER disease Mother     SOCIAL HISTORY: Social History   Tobacco Use  . Smoking status: Current Every Day Smoker    Packs/day: 0.50    Years: 14.00    Pack years: 7.00    Types: Cigarettes  . Smokeless tobacco: Never Used  . Tobacco  comment: 1/2 pk per day  Substance Use Topics  . Alcohol use: Yes    Alcohol/week: 0.0 oz    Comment: occaionally    No Known Allergies  Current Outpatient Medications  Medication Sig Dispense Refill  . ALPRAZolam (XANAX) 1 MG tablet Take 1 mg by mouth.    Marland Kitchen. aspirin 81 MG tablet Take 81 mg by mouth daily.    Marland Kitchen. imipramine (TOFRANIL) 10 MG tablet Take 10 mg by mouth at bedtime.    . insulin aspart (NOVOLOG) 100 UNIT/ML injection Inject 2-4 Units into the skin 4 (four) times daily -  before meals and at bedtime. Uses a sliding scale.    . insulin glargine (LANTUS) 100 UNIT/ML injection Inject 15 Units into the skin at bedtime.    Marland Kitchen. lisinopril (PRINIVIL,ZESTRIL) 10 MG tablet Take 10 mg by mouth daily.     No current facility-administered medications for this visit.     REVIEW OF SYSTEMS:  [X]  denotes positive finding, [ ]  denotes negative finding Cardiac  Comments:  Chest pain or chest pressure:    Shortness of breath upon exertion:    Short of breath when lying flat:    Irregular heart rhythm:  Vascular    Pain in calf, thigh, or hip brought on by ambulation: x   Pain in feet at night that wakes you up from your sleep:  x I do not detect carotid bruits.   Blood clot in your veins:    Leg swelling:         Pulmonary    Oxygen at home:    Productive cough:     Wheezing:         Neurologic    Sudden weakness in arms or legs:     Sudden numbness in arms or legs:     Sudden onset of difficulty speaking or slurred speech:    Temporary loss of vision in one eye:     Problems with dizziness:         Gastrointestinal    Blood in stool:     Vomited blood:         Genitourinary    Burning when urinating:     Blood in urine:        Psychiatric    Major depression:         Hematologic    Bleeding problems:    Problems with blood clotting too easily:        Skin    Rashes or ulcers:        Constitutional    Fever or chills:     PHYSICAL EXAM:   Vitals:    05/11/17 0940  BP: 111/72  Pulse: 96  Resp: 16  Temp: 97.8 F (36.6 C)  TempSrc: Oral  SpO2: 100%  Weight: 134 lb (60.8 kg)  Height: 5\' 11"  (1.803 m)    GENERAL: The patient is a well-nourished male, in no acute distress. The vital signs are documented above. CARDIAC: There is a regular rate and rhythm.  VASCULAR: I do not detect carotid bruits. He has palpable femoral pulses.  I cannot palpate popliteal or pedal pulses.  PULMONARY: There is good air exchange bilaterally without wheezing or rales. ABDOMEN: Soft and non-tender with normal pitched bowel sounds.  MUSCULOSKELETAL: There are no major deformities or cyanosis. NEUROLOGIC: No focal weakness or paresthesias are detected. SKIN: He developed a small wound on the dorsum of his right foot that measures about 5 mm in diameter.  There is no significant drainage or surrounding cellulitis currently. PSYCHIATRIC: The patient has a normal affect.  DATA:    ARTERIOGRAM: I reviewed his previous arteriogram from 2014.  At that time, on the right side, the common femoral, superficial femoral, and deep femoral arteries were patent.  There was mild disease in the adductor canal on the right with a moderate stenosis of the popliteal artery behind the knee.  There was three-vessel runoff on the right.  DOPPLER STUDY: By my Doppler study, he has a barely audible right posterior tibial signal and dampened monophasic left anterior tibial artery signal.  MEDICAL ISSUES:   NEW ONSET RIGHT LOWER EXTREMITY CLAUDICATION: This patient has developed significant right lower extremity claudication which is new and is significantly worse than his claudication symptoms in the left leg.  His symptoms are quite disabling.  He has barely audible signals in both feet.  He has a small wound on the dorsum of the right foot.  I recommended we proceed with arteriography.  I have reviewed with the patient the indications for arteriography. In addition, I have  reviewed the potential complications of arteriography including but not limited to: Bleeding, arterial injury, arterial thrombosis, dye action,  renal insufficiency, or other unpredictable medical problems. I have explained to the patient that if we find disease amenable to angioplasty we could potentially address this at the same time. I have discussed the potential complications of angioplasty and stenting, including but not limited to: Bleeding, arterial thrombosis, arterial injury, dissection, or the need for surgical intervention.  He cannot schedule the procedure until 05/27/2017.   Waverly Ferrari [facility] [phone]

## 2017-05-11 NOTE — H&P (View-Only) (Signed)
Patient name: Jay BlazerJason B Barber MRN: [id number] DOB: 12-Mar-1980 Sex: male  REASON FOR VISIT:   Follow-up of peripheral vascular disease.  HPI:   Jay Barber is a pleasant 37 y.o. male who has been following with peripheral vascular disease.  I last saw him on 09/29/2016.  The patient had stable left lower extremity claudication.  We discussed the importance of tobacco cessation.  I was going to stretch his follow-up out to 18 months.  However his symptoms have worsened that he presents for follow-up.  The patient has had stable left calf claudication with no significant symptoms on the right side.  He [place] the zoo in [place] and spent the entire day walking.  After that he noticed significant left calf claudication which was new.  The symptoms have persisted since then.  He experiences pain in both calves which is brought on by ambulation and relieved with rest.  He denies any history of rest pain.  He did develop a small wound on the dorsum of his right foot yesterday.  This seems to be related to a small blister.  He is cut back significantly on tobacco and is gone from 2 packs a day to half a pack per day.  There have been no other significant changes in his medical history.  Past Medical History:  Diagnosis Date  . Diabetes mellitus without complication (HCC)    takes Lantus and Novolog  . Drainage from wound    right big toe  . History of bronchitis    only had once in his life-3+yrs ago  . Nocturia   . Panic attacks    hx of but no meds required  . Peripheral vascular disease (HCC)     Family History  Problem Relation Age of Onset  . Heart disease Father        before age 38  . Hypertension Mother   . Depression Mother   . GER disease Mother     SOCIAL HISTORY: Social History   Tobacco Use  . Smoking status: Current Every Day Smoker    Packs/day: 0.50    Years: 14.00    Pack years: 7.00    Types: Cigarettes  . Smokeless tobacco: Never Used  . Tobacco  comment: 1/2 pk per day  Substance Use Topics  . Alcohol use: Yes    Alcohol/week: 0.0 oz    Comment: occaionally    No Known Allergies  Current Outpatient Medications  Medication Sig Dispense Refill  . ALPRAZolam (XANAX) 1 MG tablet Take 1 mg by mouth.    Marland Kitchen. aspirin 81 MG tablet Take 81 mg by mouth daily.    Marland Kitchen. imipramine (TOFRANIL) 10 MG tablet Take 10 mg by mouth at bedtime.    . insulin aspart (NOVOLOG) 100 UNIT/ML injection Inject 2-4 Units into the skin 4 (four) times daily -  before meals and at bedtime. Uses a sliding scale.    . insulin glargine (LANTUS) 100 UNIT/ML injection Inject 15 Units into the skin at bedtime.    Marland Kitchen. lisinopril (PRINIVIL,ZESTRIL) 10 MG tablet Take 10 mg by mouth daily.     No current facility-administered medications for this visit.     REVIEW OF SYSTEMS:  [X]  denotes positive finding, [ ]  denotes negative finding Cardiac  Comments:  Chest pain or chest pressure:    Shortness of breath upon exertion:    Short of breath when lying flat:    Irregular heart rhythm:  Vascular    Pain in calf, thigh, or hip brought on by ambulation: x   Pain in feet at night that wakes you up from your sleep:  x I do not detect carotid bruits.   Blood clot in your veins:    Leg swelling:         Pulmonary    Oxygen at home:    Productive cough:     Wheezing:         Neurologic    Sudden weakness in arms or legs:     Sudden numbness in arms or legs:     Sudden onset of difficulty speaking or slurred speech:    Temporary loss of vision in one eye:     Problems with dizziness:         Gastrointestinal    Blood in stool:     Vomited blood:         Genitourinary    Burning when urinating:     Blood in urine:        Psychiatric    Major depression:         Hematologic    Bleeding problems:    Problems with blood clotting too easily:        Skin    Rashes or ulcers:        Constitutional    Fever or chills:     PHYSICAL EXAM:   Vitals:    05/11/17 0940  BP: 111/72  Pulse: 96  Resp: 16  Temp: 97.8 F (36.6 C)  TempSrc: Oral  SpO2: 100%  Weight: 134 lb (60.8 kg)  Height: 5\' 11"  (1.803 m)    GENERAL: The patient is a well-nourished male, in no acute distress. The vital signs are documented above. CARDIAC: There is a regular rate and rhythm.  VASCULAR: I do not detect carotid bruits. He has palpable femoral pulses.  I cannot palpate popliteal or pedal pulses.  PULMONARY: There is good air exchange bilaterally without wheezing or rales. ABDOMEN: Soft and non-tender with normal pitched bowel sounds.  MUSCULOSKELETAL: There are no major deformities or cyanosis. NEUROLOGIC: No focal weakness or paresthesias are detected. SKIN: He developed a small wound on the dorsum of his right foot that measures about 5 mm in diameter.  There is no significant drainage or surrounding cellulitis currently. PSYCHIATRIC: The patient has a normal affect.  DATA:    ARTERIOGRAM: I reviewed his previous arteriogram from 2014.  At that time, on the right side, the common femoral, superficial femoral, and deep femoral arteries were patent.  There was mild disease in the adductor canal on the right with a moderate stenosis of the popliteal artery behind the knee.  There was three-vessel runoff on the right.  DOPPLER STUDY: By my Doppler study, he has a barely audible right posterior tibial signal and dampened monophasic left anterior tibial artery signal.  MEDICAL ISSUES:   NEW ONSET RIGHT LOWER EXTREMITY CLAUDICATION: This patient has developed significant right lower extremity claudication which is new and is significantly worse than his claudication symptoms in the left leg.  His symptoms are quite disabling.  He has barely audible signals in both feet.  He has a small wound on the dorsum of the right foot.  I recommended we proceed with arteriography.  I have reviewed with the patient the indications for arteriography. In addition, I have  reviewed the potential complications of arteriography including but not limited to: Bleeding, arterial injury, arterial thrombosis, dye action,  renal insufficiency, or other unpredictable medical problems. I have explained to the patient that if we find disease amenable to angioplasty we could potentially address this at the same time. I have discussed the potential complications of angioplasty and stenting, including but not limited to: Bleeding, arterial thrombosis, arterial injury, dissection, or the need for surgical intervention.  He cannot schedule the procedure until 05/27/2017.   Waverly Ferrari [facility] [phone]

## 2017-06-03 ENCOUNTER — Ambulatory Visit (HOSPITAL_COMMUNITY)
Admission: RE | Disposition: A | Discharge status: Home / Self Care | Payer: Self-pay | Source: Ambulatory Visit | Attending: Vascular Surgery

## 2017-06-03 ENCOUNTER — Ambulatory Visit (HOSPITAL_COMMUNITY)
Admission: RE | Admit: 2017-06-03 | Discharge: 2017-06-03 | Disposition: A | Discharge status: Home / Self Care | Payer: Medicaid Other | Source: Ambulatory Visit | Attending: Vascular Surgery | Admitting: Vascular Surgery

## 2017-06-03 DIAGNOSIS — Z79899 Other long term (current) drug therapy: Secondary | ICD-10-CM | POA: Diagnosis not present

## 2017-06-03 DIAGNOSIS — I70211 Atherosclerosis of native arteries of extremities with intermittent claudication, right leg: Secondary | ICD-10-CM | POA: Diagnosis not present

## 2017-06-03 DIAGNOSIS — E1151 Type 2 diabetes mellitus with diabetic peripheral angiopathy without gangrene: Secondary | ICD-10-CM | POA: Diagnosis not present

## 2017-06-03 DIAGNOSIS — Z8249 Family history of ischemic heart disease and other diseases of the circulatory system: Secondary | ICD-10-CM | POA: Diagnosis not present

## 2017-06-03 DIAGNOSIS — Z794 Long term (current) use of insulin: Secondary | ICD-10-CM | POA: Insufficient documentation

## 2017-06-03 DIAGNOSIS — F17210 Nicotine dependence, cigarettes, uncomplicated: Secondary | ICD-10-CM | POA: Diagnosis not present

## 2017-06-03 DIAGNOSIS — Z7982 Long term (current) use of aspirin: Secondary | ICD-10-CM | POA: Diagnosis not present

## 2017-06-03 HISTORY — PX: ABDOMINAL AORTOGRAM: CATH118222

## 2017-06-03 HISTORY — PX: LOWER EXTREMITY ANGIOGRAPHY: CATH118251

## 2017-06-03 LAB — POCT I-STAT, CHEM 8
BUN: 4 mg/dL — AB (ref 6–20)
Calcium, Ion: 1.24 mmol/L (ref 1.15–1.40)
Chloride: 95 mmol/L — ABNORMAL LOW (ref 101–111)
Creatinine, Ser: 0.7 mg/dL (ref 0.61–1.24)
Glucose, Bld: 191 mg/dL — ABNORMAL HIGH (ref 65–99)
HEMATOCRIT: 38 % — AB (ref 39.0–52.0)
Hemoglobin: 12.9 g/dL — ABNORMAL LOW (ref 13.0–17.0)
Potassium: 4 mmol/L (ref 3.5–5.1)
SODIUM: 133 mmol/L — AB (ref 135–145)
TCO2: 27 mmol/L (ref 22–32)

## 2017-06-03 LAB — GLUCOSE, CAPILLARY: GLUCOSE-CAPILLARY: 197 mg/dL — AB (ref 65–99)

## 2017-06-03 SURGERY — ABDOMINAL AORTOGRAM
Anesthesia: LOCAL

## 2017-06-03 MED ORDER — MIDAZOLAM HCL 2 MG/2ML IJ SOLN
INTRAMUSCULAR | Status: DC | PRN
  Administered 2017-06-03: 1 mg via INTRAVENOUS

## 2017-06-03 MED ORDER — LIDOCAINE HCL 1 % IJ SOLN
INTRAMUSCULAR | Status: AC
  Filled 2017-06-03: qty 20

## 2017-06-03 MED ORDER — IODIXANOL 320 MG/ML IV SOLN
INTRAVENOUS | Status: DC | PRN
  Administered 2017-06-03: 90 mL via INTRAVENOUS

## 2017-06-03 MED ORDER — LIDOCAINE HCL (PF) 1 % IJ SOLN
INTRAMUSCULAR | Status: DC | PRN
  Administered 2017-06-03: 12 mL

## 2017-06-03 MED ORDER — MIDAZOLAM HCL 2 MG/2ML IJ SOLN
INTRAMUSCULAR | Status: AC
  Filled 2017-06-03: qty 2

## 2017-06-03 MED ORDER — FENTANYL CITRATE (PF) 100 MCG/2ML IJ SOLN
INTRAMUSCULAR | Status: AC
  Filled 2017-06-03: qty 2

## 2017-06-03 MED ORDER — HEPARIN (PORCINE) IN NACL 2-0.9 UNIT/ML-% IJ SOLN
INTRAMUSCULAR | Status: AC | PRN
  Administered 2017-06-03 (×2): 500 mL

## 2017-06-03 MED ORDER — SODIUM CHLORIDE 0.9 % IV SOLN
INTRAVENOUS | Status: DC
  Administered 2017-06-03: 11:00:00 via INTRAVENOUS

## 2017-06-03 MED ORDER — FENTANYL CITRATE (PF) 100 MCG/2ML IJ SOLN
INTRAMUSCULAR | Status: DC | PRN
  Administered 2017-06-03: 50 ug via INTRAVENOUS

## 2017-06-03 MED ORDER — HEPARIN (PORCINE) IN NACL 2-0.9 UNIT/ML-% IJ SOLN
INTRAMUSCULAR | Status: AC
  Filled 2017-06-03: qty 1000

## 2017-06-03 SURGICAL SUPPLY — 12 items
CATH ANGIO 5F PIGTAIL 65CM (CATHETERS) ×1 IMPLANT
CATH STRAIGHT 5FR 65CM (CATHETERS) ×1 IMPLANT
CATH TEMPO 5F RIM 65CM (CATHETERS) ×1 IMPLANT
COVER PRB 48X5XTLSCP FOLD TPE (BAG) IMPLANT
COVER PROBE 5X48 (BAG) ×3
KIT MICROPUNCTURE NIT STIFF (SHEATH) ×1 IMPLANT
KIT PV (KITS) ×3 IMPLANT
SHEATH AVANTI 11CM 5FR (SHEATH) ×1 IMPLANT
SYRINGE MEDRAD AVANTA MACH 7 (SYRINGE) ×1 IMPLANT
TRANSDUCER W/STOPCOCK (MISCELLANEOUS) ×3 IMPLANT
TRAY PV CATH (CUSTOM PROCEDURE TRAY) ×3 IMPLANT
WIRE BENTSON .035X145CM (WIRE) ×1 IMPLANT

## 2017-06-03 NOTE — Interval H&P Note (Signed)
History and Physical Interval Note:  06/03/2017 11:46 AM  Jay BlazerJason B Barber  has presented today for surgery, with the diagnosis of pad  The various methods of treatment have been discussed with the patient and family. After consideration of risks, benefits and other options for treatment, the patient has consented to  Procedure(s): ABDOMINAL AORTOGRAM W/LOWER EXTREMITY (N/A) as a surgical intervention .  [name] history has been reviewed, patient examined, no change in status, stable for surgery.  I have reviewed the patient's chart and labs.  Questions were answered to the patient's satisfaction.     Waverly Ferrarihristopher Akashdeep Chuba

## 2017-06-03 NOTE — Op Note (Signed)
PATIENT: Jay Barber      MRN: [id number] DOB: May 17, 1980    DATE OF PROCEDURE: 06/03/2017  INDICATIONS:    [name] is a 37 y.o. male who presents with right lower extremity claudication.  PROCEDURE:    1. Ultrasound-guided access to the left common femoral artery 2.  Aortogram with bilateral iliac arteriogram 3.  Selective catheterization of the right external iliac artery with right lower extremity runoff 4.  Conscious sedation 5.  Retrograde left femoral arteriogram  SURGEON: Di Kindle. Edilia Bo, MD, FACS  ANESTHESIA: Local with sedation  EBL: Minimal  TECHNIQUE: The patient was brought to the peripheral vascular lab and was sedated.The period of conscious sedation was 35 minutes.  During that time period, I was present face-to-face 100% of the time.  The patient was administered 1 mg of Versed and 50 mcg of fentanyl. [name] heart rate, blood pressure, and oxygen saturation were monitored by the nurse continuously during the procedure.  Both groins were prepped and draped in usual sterile fashion.  Under ultrasound guidance, after the skin was anesthetized, the left common femoral artery was cannulated with a micropuncture needle and a micropuncture sheath introduced over a wire.  This was exchanged for a 5 [place] sheath over a Bentson wire.  The pigtail catheter was positioned at the L1 vertebral body and flush aortogram obtained.  The catheter was positioned above the aortic bifurcation and oblique iliac projection was obtained.  I then exchanged the pigtail catheter for a crossover catheter which was positioned into the right common iliac artery.  The wire was advanced down into the common femoral artery and then this catheter was exchanged for a straight catheter.  Selective right external iliac arteriogram was obtained with right lower extremity runoff.  Next this catheter was removed and a retrograde left femoral arteriogram obtained to the left femoral sheath.   At the completion the patient was transferred to the holding area for removal of the sheath.  No immediate complications were noted.  FINDINGS:   1.  There are single renal arteries bilaterally with no significant renal artery stenosis identified.  The infrarenal aorta is widely patent. 2.  On the right side, which is the symptomatic side, the common iliac artery, external iliac artery and hypogastric arteries are patent.  There is a focal small eccentric plaque on the medial aspect of the right common femoral artery that is not flow limiting.  The deep femoral artery is patent.  There is a moderate to severe stenosis in the proximal superficial femoral artery which is fairly focal.  The superficial femoral artery is occluded distally and there is severe diffuse disease of the popliteal artery.  The below-knee popliteal artery is patent.  There is two-vessel runoff on the right via the posterior tibial and peroneal arteries.  The anterior tibial artery is patent proximally but occluded distally. 3.  On the left side the common iliac artery is patent.  There is a moderate stenosis within the external iliac artery of approximately 40-50%.  The hypogastric artery is patent.  The common femoral, deep femoral, and superficial femoral arteries are patent on the left.  There is moderate disease of the popliteal artery.  There is single-vessel runoff on the left via the peroneal artery.  CLINICAL NOTE: We have again discussed conservative treatment including trying to get off tobacco completely.  He is down to half a pack per day.  Of also discussed the walking program.  If conservative measures fail  that I think he could be considered for a right femoral below-knee pop bypass.  I will have him get a vein map when he comes in for a follow-up visit in 4-6 weeks.  We will discuss it further at that time.  Waverly Ferrarihristopher Dickson, MD, FACS [facility]  DATE OF DICTATION:    06/03/2017  \

## 2017-06-03 NOTE — Discharge Instructions (Signed)
° °  [facility]  ° °Discharge Instructions ° °Lower Extremity Angiogram; Angioplasty/Stenting ° °Please refer to the following instructions for your post-procedure care. Your surgeon or physician assistant will discuss any changes with you. ° °Activity ° °Avoid lifting more than 8 pounds (1 gallons of milk) for 72 hours (3 days) after your procedure. You may walk as much as you can tolerate. It's OK to drive after 72 hours. ° °Bathing/Showering ° °You may shower the day after your procedure. If you have a bandage, you may remove it at 24- 48 hours. Clean your incision site with mild soap and water. Pat the area dry with a clean towel. ° °Diet ° °Resume your pre-procedure diet. There are no special food restrictions following this procedure. All patients with peripheral vascular disease should follow a low fat/low cholesterol diet. In order to heal from your surgery, it is CRITICAL to get adequate nutrition. Your body requires vitamins, minerals, and protein. Vegetables are the best source of vitamins and minerals. Vegetables also provide the perfect balance of protein. Processed food has little nutritional value, so try to avoid this. ° °Medications ° °Resume taking all of your medications unless your doctor tells you not to. If your incision is causing pain, you may take over-the-counter pain relievers such as acetaminophen (Tylenol) ° °Follow Up ° °Follow up will be arranged at the time of your procedure. You may have an office visit scheduled or may be scheduled for surgery. Ask your surgeon if you have any questions. ° °Please call us immediately for any of the following conditions: °•Severe or worsening pain your legs or feet at rest or with walking. °•Increased pain, redness, drainage at your groin puncture site. °•Fever of 101 degrees or higher. °•If you have any mild or slow bleeding from your puncture site: lie down, apply firm constant pressure over the area with a piece of  gauze or a clean wash cloth for 30 minutes- no peeking!, call 911 right away if you are still bleeding after 30 minutes, or if the bleeding is heavy and unmanageable. ° °Reduce your risk factors of vascular disease: ° °Stop smoking. If you would like help call [facility] at 1-800-QUIT-NOW (1-800-784-8669) or  at 336-586-4000. °Manage your cholesterol °Maintain a desired weight °Control your diabetes °Keep your blood pressure down ° °If you have any questions, please call the office at [phone] ° °

## 2017-06-06 ENCOUNTER — Encounter (HOSPITAL_COMMUNITY): Payer: Self-pay | Admitting: Vascular Surgery

## 2017-06-06 ENCOUNTER — Other Ambulatory Visit: Payer: Self-pay

## 2017-06-06 ENCOUNTER — Telehealth: Payer: Self-pay | Admitting: Vascular Surgery

## 2017-06-06 DIAGNOSIS — I739 Peripheral vascular disease, unspecified: Secondary | ICD-10-CM

## 2017-06-06 MED FILL — Heparin Sodium (Porcine) 2 Unit/ML in Sodium Chloride 0.9%: INTRAMUSCULAR | Qty: 1000 | Status: AC

## 2017-06-06 MED FILL — Lidocaine HCl Local Inj 1%: INTRAMUSCULAR | Qty: 10 | Status: AC

## 2017-06-06 NOTE — Telephone Encounter (Signed)
-----   Message from Sharee PimpleMarilyn K McChesney, RN sent at 06/03/2017  2:14 PM EDT ----- Regarding: FW: 4-6 weeks to discuss possible surgery with [facility], needs Rt GSV vein map   ----- Message ----- From: Sharee PimpleMcChesney, Marilyn K, RN Sent: 06/03/2017   1:57 PM To: [facility] Clinical Pool Subject: 4-6 weeks to discuss possible surgery with C#    ----- Message ----- From: Chuck Hintickson, Christopher S, MD Sent: 06/03/2017  12:41 PM To: [facility] Charge Pool Subject: charge and f/u                                  PROCEDURE:   1. Ultrasound-guided access to the left common femoral artery 2.  Aortogram with bilateral iliac arteriogram 3.  Selective catheterization of the right external iliac artery with right lower extremity runoff 4.  Conscious sedation 5.  Retrograde left femoral arteriogram  SURGEON: Di Kindlehristopher S. Edilia Boickson, MD, FACS  ANESTHESIA: Local with sedation  He needs a follow-up visit in 4-6 weeks to discuss possible bypass versus conservative treatment.  He should have a vein map of his right great saphenous vein at that time.  Thank you. CD

## 2017-06-06 NOTE — Telephone Encounter (Signed)
LVM for appts on 5/15 US and OV  Mld lttr

## 2017-07-13 ENCOUNTER — Ambulatory Visit (HOSPITAL_COMMUNITY)
Admission: RE | Admit: 2017-07-13 | Discharge: 2017-07-13 | Disposition: A | Discharge status: Home / Self Care | Payer: Medicaid Other | Source: Ambulatory Visit | Attending: Vascular Surgery | Admitting: Vascular Surgery

## 2017-07-13 ENCOUNTER — Encounter: Payer: Self-pay | Admitting: Vascular Surgery

## 2017-07-13 ENCOUNTER — Other Ambulatory Visit: Payer: Self-pay

## 2017-07-13 ENCOUNTER — Ambulatory Visit (INDEPENDENT_AMBULATORY_CARE_PROVIDER_SITE_OTHER): Payer: Medicaid Other | Admitting: Vascular Surgery

## 2017-07-13 VITALS — BP 149/97 | HR 104 | Temp 98.0°F | Resp 16 | Ht 71.0 in | Wt 128.0 lb

## 2017-07-13 DIAGNOSIS — I739 Peripheral vascular disease, unspecified: Secondary | ICD-10-CM | POA: Diagnosis not present

## 2017-07-13 NOTE — H&P (View-Only) (Signed)
  Patient name: Jay Barber MRN: [id number] DOB: 04/29/1980 Sex: male   REASON FOR CONSULT:    Nonhealing wound right foot  HPI:   Jay Barber is a pleasant 37 y.o. male,  who underwent an arteriogram because of his disabling right lower extremity claudication.  On the right side, the common iliac and external iliac arteries were patent.  There was a focal small eccentric plaque on the medial aspect of the right common femoral artery that was not flow-limiting.  The deep femoral artery was patent.  There was moderate to severe disease in the proximal superficial femoral artery.  The superficial femoral artery is occluded distally with severe diffuse disease of the popliteal artery.  The below-knee popliteal artery is patent.  There is two-vessel runoff on the right via the posterior tibial and peroneal arteries.  The anterior tibial artery was occluded distally.  We discussed the importance of tobacco cessation and I favored a conservative approach.  He comes in today for a vein map.  Since I saw him last he has developed a wound on the dorsum of his medial right foot.  I believe this started as a blister and is really not showing any significant signs of healing over the last month.  He describes claudication in the right leg and some occasional rest pain.  He continues to smoke but is trying to cut back.   Past Medical History:  Diagnosis Date  . Diabetes mellitus without complication (HCC)    takes Lantus and Novolog  . Drainage from wound    right big toe  . History of bronchitis    only had once in his life-3+yrs ago  . Nocturia   . Panic attacks    hx of but no meds required  . Peripheral vascular disease (HCC)     Family History  Problem Relation Age of Onset  . Heart disease Father        before age 60  . Hypertension Mother   . Depression Mother   . GER disease Mother     SOCIAL HISTORY: Social History   Socioeconomic History  . Marital status: Single   Spouse name: Not on file  . Number of children: Not on file  . Years of education: Not on file  . Highest education level: Not on file  Occupational History  . Not on file  Social Needs  . Financial resource strain: Not on file  . Food insecurity:    Worry: Not on file    Inability: Not on file  . Transportation needs:    Medical: Not on file    Non-medical: Not on file  Tobacco Use  . Smoking status: Current Every Day Smoker    Packs/day: 0.50    Years: 14.00    Pack years: 7.00    Types: Cigarettes  . Smokeless tobacco: Never Used  . Tobacco comment: 1/2 pk per day  Substance and Sexual Activity  . Alcohol use: Yes    Alcohol/week: 0.0 oz    Comment: occaionally  . Drug use: No  . Sexual activity: Not on file  Lifestyle  . Physical activity:    Days per week: Not on file    Minutes per session: Not on file  . Stress: Not on file  Relationships  . Social connections:    Talks on phone: Not on file    Gets together: Not on file    Attends religious service: Not on file      Active member of club or organization: Not on file    Attends meetings of clubs or organizations: Not on file    Relationship status: Not on file  . Intimate partner violence:    Fear of current or ex partner: Not on file    Emotionally abused: Not on file    Physically abused: Not on file    Forced sexual activity: Not on file  Other Topics Concern  . Not on file  Social History Narrative  . Not on file    No Known Allergies  Current Outpatient Medications  Medication Sig Dispense Refill  . ALPRAZolam (XANAX) 1 MG tablet Take 1 mg by mouth 2 (two) times daily as needed for anxiety.     . aspirin 81 MG tablet Take 81 mg by mouth daily.    . imipramine (TOFRANIL) 25 MG tablet Take 25 mg by mouth at bedtime.     . insulin aspart (NOVOLOG) 100 UNIT/ML injection Inject 2-4 Units into the skin 3 (three) times daily with meals. Per sliding scale    . insulin glargine (LANTUS) 100 UNIT/ML  injection Inject 12 Units into the skin at bedtime.     . lisinopril (PRINIVIL,ZESTRIL) 5 MG tablet Take 7.5 mg by mouth daily.     . Multiple Vitamins-Minerals (ALIVE MENS ENERGY PO) Take 1 tablet by mouth daily.    . mupirocin ointment (BACTROBAN) 2 % Apply 1 application topically 2 (two) times daily.  2   No current facility-administered medications for this visit.     REVIEW OF SYSTEMS:  [X] denotes positive finding, [ ] denotes negative finding Cardiac  Comments:  Chest pain or chest pressure:    Shortness of breath upon exertion:    Short of breath when lying flat:    Irregular heart rhythm:        Vascular    Pain in calf, thigh, or hip brought on by ambulation: x   Pain in feet at night that wakes you up from your sleep:  x   Blood clot in your veins:    Leg swelling:         Pulmonary    Oxygen at home:    Productive cough:     Wheezing:         Neurologic    Sudden weakness in arms or legs:     Sudden numbness in arms or legs:     Sudden onset of difficulty speaking or slurred speech:    Temporary loss of vision in one eye:     Problems with dizziness:         Gastrointestinal    Blood in stool:     Vomited blood:         Genitourinary    Burning when urinating:     Blood in urine:        Psychiatric    Major depression:         Hematologic    Bleeding problems:    Problems with blood clotting too easily:        Skin    Rashes or ulcers:        Constitutional    Fever or chills:     PHYSICAL EXAM:   Vitals:   07/13/17 1540  BP: (!) 149/97  Pulse: (!) 104  Resp: 16  Temp: 98 F (36.7 C)  TempSrc: Oral  SpO2: 99%  Weight: 128 lb (58.1 kg)  Height: 5' 11" (1.803 m)      GENERAL: The patient is a well-nourished male, in no acute distress. The vital signs are documented above. CARDIAC: There is a regular rate and rhythm.  VASCULAR: I do not detect carotid bruits. He has palpable femoral pulses.  I cannot palpate popliteal or pedal  pulses. PULMONARY: There is good air exchange bilaterally without wheezing or rales. ABDOMEN: Soft and non-tender with normal pitched bowel sounds.  MUSCULOSKELETAL: There are no major deformities or cyanosis. NEUROLOGIC: No focal weakness or paresthesias are detected. SKIN: He has a wound on the dorsal aspect of his right foot medially which measures 2 cm in diameter.  This goes to the tendon. PSYCHIATRIC: The patient has a normal affect.  DATA:    VEIN MAP: I have independently interpreted his right great saphenous vein map.  The vein is quite small and does not appear to be adequate as a bypass conduit.  ARTERIOGRAM: I reviewed his arteriogram from March.  This shows some disease in the proximal superficial femoral artery extends up to the origin.  The  the distal superficial femoral artery and popliteal artery is severely diseased distally with reconstitution of the below-knee popliteal artery and two-vessel runoff.  MEDICAL ISSUES:   CRITICAL LIMB ISCHEMIA RIGHT LOWER EXTREMITY: This patient has infrainguinal arterial occlusive disease and now a nonhealing wound of the right foot.  This is clearly a limb threatening problem.  We have again discussed the importance of tobacco cessation.  Based on his previous arteriogram he appears to be a candidate for a right femoral to below-knee popliteal artery bypass.  Unfortunately the vein does not appear to be adequate as a bypass conduit.  On exam the saphenous vein on the left does not look any better.  I recommended we proceed with right femoral to below-knee popliteal artery bypass likely with a prosthetic graft.  He wants to wait until his kids are out of school and given that the wound is stable I do not think this is unreasonable.  His surgery has been scheduled for 08/09/2017.  I have reviewed the indications for lower extremity bypass. I have also reviewed the potential complications of surgery including but not limited to: wound healing  problems, infection, graft thrombosis, limb loss, or other unpredictable medical problems. All the patient's questions were answered and they are agreeable to proceed.   Christopher Dickson [facility] [phone] 

## 2017-07-13 NOTE — Progress Notes (Signed)
Patient name: Jay Barber MRN: [id number] DOB: March 06, 1980 Sex: male   REASON FOR CONSULT:    Nonhealing wound right foot  HPI:   Jay Barber is a pleasant 37 y.o. male,  who underwent an arteriogram because of his disabling right lower extremity claudication.  On the right side, the common iliac and external iliac arteries were patent.  There was a focal small eccentric plaque on the medial aspect of the right common femoral artery that was not flow-limiting.  The deep femoral artery was patent.  There was moderate to severe disease in the proximal superficial femoral artery.  The superficial femoral artery is occluded distally with severe diffuse disease of the popliteal artery.  The below-knee popliteal artery is patent.  There is two-vessel runoff on the right via the posterior tibial and peroneal arteries.  The anterior tibial artery was occluded distally.  We discussed the importance of tobacco cessation and I favored a conservative approach.  He comes in today for a vein map.  Since I saw him last he has developed a wound on the dorsum of his medial right foot.  I believe this started as a blister and is really not showing any significant signs of healing over the last month.  He describes claudication in the right leg and some occasional rest pain.  He continues to smoke but is trying to cut back.   Past Medical History:  Diagnosis Date  . Diabetes mellitus without complication (HCC)    takes Lantus and Novolog  . Drainage from wound    right big toe  . History of bronchitis    only had once in his life-3+yrs ago  . Nocturia   . Panic attacks    hx of but no meds required  . Peripheral vascular disease (HCC)     Family History  Problem Relation Age of Onset  . Heart disease Father        before age 1  . Hypertension Mother   . Depression Mother   . GER disease Mother     SOCIAL HISTORY: Social History   Socioeconomic History  . Marital status: Single   Spouse name: Not on file  . Number of children: Not on file  . Years of education: Not on file  . Highest education level: Not on file  Occupational History  . Not on file  Social Needs  . Financial resource strain: Not on file  . Food insecurity:    Worry: Not on file    Inability: Not on file  . Transportation needs:    Medical: Not on file    Non-medical: Not on file  Tobacco Use  . Smoking status: Current Every Day Smoker    Packs/day: 0.50    Years: 14.00    Pack years: 7.00    Types: Cigarettes  . Smokeless tobacco: Never Used  . Tobacco comment: 1/2 pk per day  Substance and Sexual Activity  . Alcohol use: Yes    Alcohol/week: 0.0 oz    Comment: occaionally  . Drug use: No  . Sexual activity: Not on file  Lifestyle  . Physical activity:    Days per week: Not on file    Minutes per session: Not on file  . Stress: Not on file  Relationships  . Social connections:    Talks on phone: Not on file    Gets together: Not on file    Attends religious service: Not on file  Active member of club or organization: Not on file    Attends meetings of clubs or organizations: Not on file    Relationship status: Not on file  . Intimate partner violence:    Fear of current or ex partner: Not on file    Emotionally abused: Not on file    Physically abused: Not on file    Forced sexual activity: Not on file  Other Topics Concern  . Not on file  Social History Narrative  . Not on file    No Known Allergies  Current Outpatient Medications  Medication Sig Dispense Refill  . ALPRAZolam (XANAX) 1 MG tablet Take 1 mg by mouth 2 (two) times daily as needed for anxiety.     Marland Kitchen aspirin 81 MG tablet Take 81 mg by mouth daily.    Marland Kitchen imipramine (TOFRANIL) 25 MG tablet Take 25 mg by mouth at bedtime.     . insulin aspart (NOVOLOG) 100 UNIT/ML injection Inject 2-4 Units into the skin 3 (three) times daily with meals. Per sliding scale    . insulin glargine (LANTUS) 100 UNIT/ML  injection Inject 12 Units into the skin at bedtime.     Marland Kitchen lisinopril (PRINIVIL,ZESTRIL) 5 MG tablet Take 7.5 mg by mouth daily.     . Multiple Vitamins-Minerals (ALIVE MENS ENERGY PO) Take 1 tablet by mouth daily.    . mupirocin ointment (BACTROBAN) 2 % Apply 1 application topically 2 (two) times daily.  2   No current facility-administered medications for this visit.     REVIEW OF SYSTEMS:   denotes positive finding,  denotes negative finding Cardiac  Comments:  Chest pain or chest pressure:    Shortness of breath upon exertion:    Short of breath when lying flat:    Irregular heart rhythm:        Vascular    Pain in calf, thigh, or hip brought on by ambulation: x   Pain in feet at night that wakes you up from your sleep:  x   Blood clot in your veins:    Leg swelling:         Pulmonary    Oxygen at home:    Productive cough:     Wheezing:         Neurologic    Sudden weakness in arms or legs:     Sudden numbness in arms or legs:     Sudden onset of difficulty speaking or slurred speech:    Temporary loss of vision in one eye:     Problems with dizziness:         Gastrointestinal    Blood in stool:     Vomited blood:         Genitourinary    Burning when urinating:     Blood in urine:        Psychiatric    Major depression:         Hematologic    Bleeding problems:    Problems with blood clotting too easily:        Skin    Rashes or ulcers:        Constitutional    Fever or chills:     PHYSICAL EXAM:   Vitals:   07/13/17 1540  BP: (!) 149/97  Pulse: (!) 104  Resp: 16  Temp: 98 F (36.7 C)  TempSrc: Oral  SpO2: 99%  Weight: 128 lb (58.1 kg)  Height:  (1.803 m)  GENERAL: The patient is a well-nourished male, in no acute distress. The vital signs are documented above. CARDIAC: There is a regular rate and rhythm.  VASCULAR: I do not detect carotid bruits. He has palpable femoral pulses.  I cannot palpate popliteal or pedal  pulses. PULMONARY: There is good air exchange bilaterally without wheezing or rales. ABDOMEN: Soft and non-tender with normal pitched bowel sounds.  MUSCULOSKELETAL: There are no major deformities or cyanosis. NEUROLOGIC: No focal weakness or paresthesias are detected. SKIN: He has a wound on the dorsal aspect of his right foot medially which measures 2 cm in diameter.  This goes to the tendon. PSYCHIATRIC: The patient has a normal affect.  DATA:    VEIN MAP: I have independently interpreted his right great saphenous vein map.  The vein is quite small and does not appear to be adequate as a bypass conduit.  ARTERIOGRAM: I reviewed his arteriogram from March.  This shows some disease in the proximal superficial femoral artery extends up to the origin.  The  the distal superficial femoral artery and popliteal artery is severely diseased distally with reconstitution of the below-knee popliteal artery and two-vessel runoff.  MEDICAL ISSUES:   CRITICAL LIMB ISCHEMIA RIGHT LOWER EXTREMITY: This patient has infrainguinal arterial occlusive disease and now a nonhealing wound of the right foot.  This is clearly a limb threatening problem.  We have again discussed the importance of tobacco cessation.  Based on his previous arteriogram he appears to be a candidate for a right femoral to below-knee popliteal artery bypass.  Unfortunately the vein does not appear to be adequate as a bypass conduit.  On exam the saphenous vein on the left does not look any better.  I recommended we proceed with right femoral to below-knee popliteal artery bypass likely with a prosthetic graft.  He wants to wait until his kids are out of school and given that the wound is stable I do not think this is unreasonable.  His surgery has been scheduled for 08/09/2017.  I have reviewed the indications for lower extremity bypass. I have also reviewed the potential complications of surgery including but not limited to: wound healing  problems, infection, graft thrombosis, limb loss, or other unpredictable medical problems. All the patient's questions were answered and they are agreeable to proceed.   Waverly Ferrari [facility] [phone]

## 2017-07-20 ENCOUNTER — Other Ambulatory Visit: Payer: Self-pay | Admitting: *Deleted

## 2017-08-01 NOTE — Pre-Procedure Instructions (Signed)
Jay BlazerJason B Barber  08/01/2017      [id number] [place], [place] - [address] [place] [id number] Phone: [phone] Fax: [phone]    Your procedure is scheduled on  Tuesday 08/09/17  [facility] at 530 A.M.  Call this number if you have problems the morning of surgery:  [phone]   Remember:  No food OR DRINK after midnight.     Take these medicines the morning of surgery with A SIP OF WATER-  ALPRAZOLAM (XANAX), OMEPRAZOLE (PRILOSEC)  7 days prior to surgery STOP taking any Aspirin(unless otherwise instructed by your surgeon), Aleve, Naproxen, Ibuprofen, Motrin, Advil, Goody's, BC's, all herbal medications, fish oil, and all vitamins     How to Manage Your Diabetes Before and After Surgery  Why is it important to control my blood sugar before and after surgery? . Improving blood sugar levels before and after surgery helps healing and can limit problems. . A way of improving blood sugar control is eating a healthy diet by: o  Eating less sugar and carbohydrates o  Increasing activity/exercise o  Talking with your doctor about reaching your blood sugar goals . High blood sugars (greater than 180 mg/dL) can raise your risk of infections and slow your recovery, so you will need to focus on controlling your diabetes during the weeks before surgery. . Make sure that the doctor who takes care of your diabetes knows about your planned surgery including the date and location.  How do I manage my blood sugar before surgery? . Check your blood sugar at least 4 times a day, starting 2 days before surgery, to make sure that the level is not too high or low. o Check your blood sugar the morning of your surgery when you wake up and every 2 hours until you get to the Short Stay unit. . If your blood sugar is less than 70 mg/dL, you will need to treat for low blood sugar: o Do not take insulin. o Treat a low blood sugar  (less than 70 mg/dL) with  cup of clear juice (cranberry or apple), 4 glucose tablets, OR glucose gel. Recheck blood sugar in 15 minutes after treatment (to make sure it is greater than 70 mg/dL). If your blood sugar is not greater than 70 mg/dL on recheck, call [phone] o  for further instructions. . Report your blood sugar to the short stay nurse when you get to Short Stay.  . If you are admitted to the hospital after surgery: o Your blood sugar will be checked by the staff and you will probably be given insulin after surgery (instead of oral diabetes medicines) to make sure you have good blood sugar levels. o The goal for blood sugar control after surgery is 80-180 mg/dL.              WHAT DO I DO ABOUT MY DIABETES MEDICATION?   Marland Kitchen. Do not take oral diabetes medicines (pills) the morning of surgery.  . THE NIGHT BEFORE SURGERY, take ___________ units of ___________insulin.       . THE MORNING OF SURGERY, take _____________ units of __________insulin.  . The day of surgery, do not take other diabetes injectables, including Byetta (exenatide), Bydureon (exenatide ER), Victoza (liraglutide), or Trulicity (dulaglutide).  . If your CBG is greater than 220 mg/dL, you may take  of your sliding scale (correction) dose of insulin.  Other Instructions:  Patient Signature:  Date:   Nurse Signature:  Date:   Reviewed and Endorsed by [facility] Patient Education Committee, August 2015  Do not wear jewelry, make-up or nail polish.  Do not wear lotions, powders, or perfumes, or deodorant.  Do not shave 48 hours prior to surgery.  Men may shave face and neck.  Do not bring valuables to the hospital.  [facility] is not responsible for any belongings or valuables.  Contacts, dentures or bridgework may not be worn into surgery.  Leave your suitcase in the car.  After surgery it may be brought to your room.  For patients admitted to the hospital, discharge time will  be determined by your treatment team.  Patients discharged the day of surgery will not be allowed to drive home.   Name and phone number of your driver:    Special instructions:  [place] - Preparing for Surgery  Before surgery, you can play an important role.  Because skin is not sterile, your skin needs to be as free of germs as possible.  You can reduce the number of germs on you skin by washing with CHG (chlorahexidine gluconate) soap before surgery.  CHG is an antiseptic cleaner which kills germs and bonds with the skin to continue killing germs even after washing.  Oral Hygiene is also important in reducing the risk of infection.  Remember to brush your teeth with your regular toothpaste the morning of surgery.  Please DO NOT use if you have an allergy to CHG or antibacterial soaps.  If your skin becomes reddened/irritated stop using the CHG and inform your nurse when you arrive at Short Stay.  Do not shave (including legs and underarms) for at least 48 hours prior to the first CHG shower.  You may shave your face.  Please follow these instructions carefully:   1.  Shower with CHG Soap the night before surgery and the morning of Surgery.  2.  If you choose to wash your hair, wash your hair first as usual with your normal shampoo.  3.  After you shampoo, rinse your hair and body thoroughly to remove the shampoo. 4.  Use CHG as you would any other liquid soap.  You can apply chg directly to the skin and wash gently with a      scrungie or washcloth.           5.  Apply the CHG Soap to your body ONLY FROM THE NECK DOWN.   Do not use on open wounds or open sores. Avoid contact with your eyes, ears, mouth and genitals (private parts).  Wash genitals (private parts) with your normal soap.  6.  Wash thoroughly, paying special attention to the area where your surgery will be performed.  7.  Thoroughly rinse your body with warm water from the neck down.  8.  DO NOT shower/wash with your normal  soap after using and rinsing off the CHG Soap.  9.  Pat yourself dry with a clean towel.            10.  Wear clean pajamas.            11.  Place clean sheets on your bed the night of your first shower and do not sleep with pets.  Day of Surgery  Do not apply any lotions/deoderants the morning of surgery.   Please wear clean clothes to the hospital/surgery center. Remember to brush your teeth with toothpaste.     Please  read over the following fact sheets that you were given. MRSA Information and Surgical Site Infection Prevention

## 2017-08-02 ENCOUNTER — Other Ambulatory Visit: Payer: Self-pay

## 2017-08-02 ENCOUNTER — Encounter (HOSPITAL_COMMUNITY)
Admission: RE | Admit: 2017-08-02 | Discharge: 2017-08-02 | Disposition: A | Discharge status: Home / Self Care | Payer: Medicaid Other | Source: Ambulatory Visit | Attending: Vascular Surgery | Admitting: Vascular Surgery

## 2017-08-02 ENCOUNTER — Encounter (HOSPITAL_COMMUNITY): Payer: Self-pay

## 2017-08-02 DIAGNOSIS — Z01812 Encounter for preprocedural laboratory examination: Secondary | ICD-10-CM | POA: Diagnosis present

## 2017-08-02 HISTORY — DX: Complete loss of teeth, unspecified cause, unspecified class: K08.109

## 2017-08-02 HISTORY — DX: Major depressive disorder, single episode, unspecified: F32.9

## 2017-08-02 HISTORY — DX: Depression, unspecified: F32.A

## 2017-08-02 HISTORY — DX: Presence of spectacles and contact lenses: Z97.3

## 2017-08-02 HISTORY — DX: Gastro-esophageal reflux disease without esophagitis: K21.9

## 2017-08-02 HISTORY — DX: Presence of dental prosthetic device (complete) (partial): Z97.2

## 2017-08-02 HISTORY — DX: Essential (primary) hypertension: I10

## 2017-08-02 HISTORY — DX: Family history of other specified conditions: Z84.89

## 2017-08-02 LAB — CBC
HEMATOCRIT: 36.9 % — AB (ref 39.0–52.0)
Hemoglobin: 12.9 g/dL — ABNORMAL LOW (ref 13.0–17.0)
MCH: 33 pg (ref 26.0–34.0)
MCHC: 35 g/dL (ref 30.0–36.0)
MCV: 94.4 fL (ref 78.0–100.0)
PLATELETS: 327 10*3/uL (ref 150–400)
RBC: 3.91 MIL/uL — ABNORMAL LOW (ref 4.22–5.81)
RDW: 12.1 % (ref 11.5–15.5)
WBC: 7.9 10*3/uL (ref 4.0–10.5)

## 2017-08-02 LAB — COMPREHENSIVE METABOLIC PANEL
ALT: 16 U/L — ABNORMAL LOW (ref 17–63)
ANION GAP: 10 (ref 5–15)
AST: 31 U/L (ref 15–41)
Albumin: 3.6 g/dL (ref 3.5–5.0)
Alkaline Phosphatase: 105 U/L (ref 38–126)
BILIRUBIN TOTAL: 0.4 mg/dL (ref 0.3–1.2)
BUN: 8 mg/dL (ref 6–20)
CO2: 29 mmol/L (ref 22–32)
Calcium: 9.7 mg/dL (ref 8.9–10.3)
Chloride: 97 mmol/L — ABNORMAL LOW (ref 101–111)
Creatinine, Ser: 0.7 mg/dL (ref 0.61–1.24)
GFR calc Af Amer: >60 mL/min (ref >60)
GFR calc non Af Amer: >60 mL/min (ref >60)
Glucose, Bld: 142 mg/dL — ABNORMAL HIGH (ref 65–99)
POTASSIUM: 4.5 mmol/L (ref 3.5–5.1)
Sodium: 136 mmol/L (ref 135–145)
Total Protein: 6.9 g/dL (ref 6.5–8.1)

## 2017-08-02 LAB — TYPE AND SCREEN
ABO/RH(D): O POS
ANTIBODY SCREEN: NEGATIVE

## 2017-08-02 LAB — URINALYSIS, ROUTINE W REFLEX MICROSCOPIC
Bacteria, UA: NONE SEEN
Bilirubin Urine: NEGATIVE
GLUCOSE, UA: 50 mg/dL — AB
HGB URINE DIPSTICK: NEGATIVE
KETONES UR: NEGATIVE mg/dL
Leukocytes, UA: NEGATIVE
NITRITE: NEGATIVE
PH: 7 (ref 5.0–8.0)
PROTEIN: 30 mg/dL — AB
Specific Gravity, Urine: 1.004 — ABNORMAL LOW (ref 1.005–1.030)

## 2017-08-02 LAB — APTT: APTT: 31 s (ref 24–36)

## 2017-08-02 LAB — HEMOGLOBIN A1C
Hgb A1c MFr Bld: 8.2 % — ABNORMAL HIGH (ref 4.8–5.6)
Mean Plasma Glucose: 188.64 mg/dL

## 2017-08-02 LAB — GLUCOSE, CAPILLARY: Glucose-Capillary: 158 mg/dL — ABNORMAL HIGH (ref 65–99)

## 2017-08-02 LAB — PROTIME-INR
INR: 0.91
Prothrombin Time: 12.2 seconds (ref 11.4–15.2)

## 2017-08-02 LAB — SURGICAL PCR SCREEN
MRSA, PCR: NEGATIVE
Staphylococcus aureus: NEGATIVE

## 2017-08-02 LAB — ABO/RH: ABO/RH(D): O POS

## 2017-08-02 NOTE — Progress Notes (Signed)
Pt denies SOB, chest pain, and being under the care of a cardiologist. Pt denies having an echo and cardiac cath but stated that a stress test was performed 10 years ago " it was nothing but anxiety ." Pt stated that [facility]; records requested. Pt denies recent labs. Pt stated that he did not take BP medications (BP slightly elevated at PAT appointment).

## 2017-08-02 NOTE — Pre-Procedure Instructions (Signed)
Jay Barber  08/02/2017     [id number] [place], [place] - [address] [place]  [id number] Phone: [phone] Fax: [phone]   Your procedure is scheduled on  Tuesday 08/09/17  [facility] at 5:30 A.M.  Call this number if you have problems the morning of surgery:  [phone]   Remember:  No food OR DRINK after midnight, Monday, August 08, 2017    Take these medicines the morning of surgery with A SIP OF WATER: Aspirin  If needed: ALPRAZOLAM [name] for anxiety,  OMEPRAZOLE (PRILOSEC) for acid reflux).  7 days prior to surgery STOP taking Aleve, Naproxen, Ibuprofen, Motrin, Advil, Goody's, BC's, all herbal medications, fish oil, and all vitamins     How to Manage Your Diabetes Before and After Surgery  Why is it important to control my blood sugar before and after surgery? . Improving blood sugar levels before and after surgery helps healing and can limit problems. . A way of improving blood sugar control is eating a healthy diet by: o  Eating less sugar and carbohydrates o  Increasing activity/exercise o  Talking with your doctor about reaching your blood sugar goals . High blood sugars (greater than 180 mg/dL) can raise your risk of infections and slow your recovery, so you will need to focus on controlling your diabetes during the weeks before surgery. . Make sure that the doctor who takes care of your diabetes knows about your planned surgery including the date and location.  How do I manage my blood sugar before surgery? . Check your blood sugar at least 4 times a day, starting 2 days before surgery, to make sure that the level is not too high or low. o Check your blood sugar the morning of your surgery when you wake up and every 2 hours until you get to the Short Stay unit. . If your blood sugar is less than 70 mg/dL, you will need to treat for low blood sugar: o Do not take insulin. o Treat a low blood  sugar (less than 70 mg/dL) with  cup of clear juice (cranberry or apple), 4 glucose tablets, OR glucose gel. Recheck blood sugar in 15 minutes after treatment (to make sure it is greater than 70 mg/dL). If your blood sugar is not greater than 70 mg/dL on recheck, call [phone] o  for further instructions. . Report your blood sugar to the short stay nurse when you get to Short Stay.  . If you are admitted to the hospital after surgery: o Your blood sugar will be checked by the staff and you will probably be given insulin after surgery (instead of oral diabetes medicines) to make sure you have good blood sugar levels. o The goal for blood sugar control after surgery is 80-180 mg/dL  WHAT DO I DO ABOUT MY DIABETES MEDICATION?  . THE NIGHT BEFORE SURGERY, take 6 units of Lantus insulin.      . If your CBG is greater than 220 mg/dL, you may take  of your sliding scale (correction) dose of insulin.  Reviewed and Endorsed by [facility] Patient Education Committee, August 2015  Do not wear jewelry, make-up or nail polish.  Do not wear lotions, powders, or perfumes, or deodorant.  Do not shave 48 hours prior to surgery.  Men may shave face and neck.  Do not bring valuables to the hospital.  [facility] is not responsible for any belongings or  valuables.  Contacts, dentures or bridgework may not be worn into surgery.  Leave your suitcase in the car.  After surgery it may be brought to your room.  Special instructions:  [place] - Preparing for Surgery  Before surgery, you can play an important role.  Because skin is not sterile, your skin needs to be as free of germs as possible.  You can reduce the number of germs on you skin by washing with CHG (chlorahexidine gluconate) soap before surgery.  CHG is an antiseptic cleaner which kills germs and bonds with the skin to continue killing germs even after washing.  Oral Hygiene is also important in reducing the risk of infection.  Remember to  brush your teeth with your regular toothpaste the morning of surgery.  Please DO NOT use if you have an allergy to CHG or antibacterial soaps.  If your skin becomes reddened/irritated stop using the CHG and inform your nurse when you arrive at Short Stay.  Do not shave (including legs and underarms) for at least 48 hours prior to the first CHG shower.  You may shave your face.  Please follow these instructions carefully:   1.  Shower with CHG Soap the night before surgery and the morning of Surgery.  2.  If you choose to wash your hair, wash your hair first as usual with your normal shampoo.  3.  After you shampoo, rinse your hair and body thoroughly to remove the shampoo. 4.  Use CHG as you would any other liquid soap.  You can apply chg directly to the skin and wash gently with a      scrungie or washcloth.           5.  Apply the CHG Soap to your body ONLY FROM THE NECK DOWN.   Do not use on open wounds or open sores. Avoid contact with your eyes, ears, mouth and genitals (private parts).  Wash genitals (private parts) with your normal soap.  6.  Wash thoroughly, paying special attention to the area where your surgery will be performed.  7.  Thoroughly rinse your body with warm water from the neck down.  8.  DO NOT shower/wash with your normal soap after using and rinsing off the CHG Soap.  9.  Pat yourself dry with a clean towel.            10.  Wear clean pajamas.            11.  Place clean sheets on your bed the night of your first shower and do not sleep with pets.  Day of Surgery  Do not apply any lotions/deoderants the morning of surgery.   Please wear clean clothes to the hospital/surgery center. Remember to brush your teeth with toothpaste.     Please read over the following fact sheets that you were given. MRSA Information and Surgical Site Infection Prevention

## 2017-08-05 NOTE — Progress Notes (Signed)
Spoke with medical records at [facility] who stated there is no CXR  And they only have record fron 2015 ED visit.

## 2017-08-05 NOTE — Progress Notes (Signed)
Anesthesia Chart Review:  Case:  [id number] Date/Time:  08/09/17 0715   Procedure:  BYPASS GRAFT FEMORAL-BELOW KNEE POPLITEAL ARTERY POSSIBLE PTFE GRAFT (Right )   Anesthesia type:  General   Pre-op diagnosis:  claudication   [facility]  [facility] ROOM 11 / [facility] OR   Surgeon:  Chuck Hint, MD      DISCUSSION: Patient is a 37 year old male scheduled for the above procedure. History includes smoking, PAD (s/p right great toe amputation 05/11/13), DM (insulin requiring since '00), HTN, GERD. He has infrainguinal arterial occlusive disease and now a nonhealing wound of the right foot. "This is clearly a limb threatening problem" according to Dr. Edilia Bo 07/13/17 noted. Based on his previous arteriogram he is thought to be a candidate for a right femoral to below-knee popliteal artery bypass, although may require prosthetic graft.  He denied chest pain, SOB, and being under the care of a cardiologist. He reported a stress test ~ 10 years ago that was fine. Based on currently available information, I would anticipate that he can proceed as planned if no acute changes.    VS: BP (!) 151/87   Pulse 96   Temp 36.7 C   Resp 18   Ht 5\' 11"  (1.803 m)   Wt 129 lb (58.5 kg)   SpO2 100%   BMI 17.99 kg/m   PROVIDERS: System, Pcp Not In  LABS: Labs reviewed: Acceptable for surgery. A1c 8.2.  (all labs ordered are listed, but only abnormal results are displayed)  Labs Reviewed  GLUCOSE, CAPILLARY - Abnormal; Notable for the following components:      Result Value   Glucose-Capillary 158 (*)    All other components within normal limits  CBC - Abnormal; Notable for the following components:   RBC 3.91 (*)    Hemoglobin 12.9 (*)    HCT 36.9 (*)    All other components within normal limits  COMPREHENSIVE METABOLIC PANEL - Abnormal; Notable for the following components:   Chloride 97 (*)    Glucose, Bld 142 (*)    ALT 16 (*)    All other components within normal limits  URINALYSIS, ROUTINE W  REFLEX MICROSCOPIC - Abnormal; Notable for the following components:   Specific Gravity, Urine 1.004 (*)    Glucose, UA 50 (*)    Protein, ur 30 (*)    All other components within normal limits  HEMOGLOBIN A1C - Abnormal; Notable for the following components:   Hgb A1c MFr Bld 8.2 (*)    All other components within normal limits  SURGICAL PCR SCREEN  APTT  PROTIME-INR  TYPE AND SCREEN  ABO/RH    EKG: 06/03/17 EKG: NSR. Probable early repolarization abnormality New since previous tracing.    CV: N/A  Past Medical History:  Diagnosis Date  . Depression   . Diabetes mellitus without complication (HCC)    takes Lantus and Novolog  . Drainage from wound    right big toe  . Family history of adverse reaction to anesthesia    mother had PONV  . Full dentures   . GERD (gastroesophageal reflux disease)   . History of bronchitis    only had once in his life-3+yrs ago  . Hypertension   . Nocturia   . Panic attacks    hx of but no meds required  . Peripheral vascular disease (HCC)   . Wears contact lenses     Past Surgical History:  Procedure Laterality Date  . ABDOMINAL AORTAGRAM N/A  11/06/2012   Procedure: ABDOMINAL Ronny FlurryAORTAGRAM;  Surgeon: Chuck Hinthristopher S Dickson, MD;  [facility];  Service: Cardiovascular;  Laterality: N/A;  . ABDOMINAL AORTOGRAM N/A 06/03/2017   Procedure: ABDOMINAL AORTOGRAM;  Surgeon: Chuck Hintickson, Christopher S, MD;  [facility];  Service: Cardiovascular;  Laterality: N/A;  . AMPUTATION Right 05/11/2013   Procedure: AMPUTATION DIGIT;  Surgeon: Nadara MustardMarcus V Duda, MD;  Location: [facility] OR;  Service: Orthopedics;  Laterality: Right;  Right Great Toe Amputation at MTP  . ESOPHAGOGASTRODUODENOSCOPY    . LOWER EXTREMITY ANGIOGRAPHY Bilateral 06/03/2017   Procedure: Lower Extremity Angiography;  Surgeon: Chuck Hintickson, Christopher S, MD;  [facility];  Service: Cardiovascular;  Laterality: Bilateral;  . MOUTH SURGERY    . MULTIPLE TOOTH EXTRACTIONS       MEDICATIONS: . ALPRAZolam (XANAX) 1 MG tablet  . aspirin 81 MG tablet  . imipramine (TOFRANIL) 25 MG tablet  . insulin aspart (NOVOLOG) 100 UNIT/ML injection  . insulin glargine (LANTUS) 100 UNIT/ML injection  . lisinopril (PRINIVIL,ZESTRIL) 5 MG tablet  . Multiple Vitamins-Minerals (ALIVE MENS ENERGY PO)  . mupirocin ointment (BACTROBAN) 2 %  . omeprazole (PRILOSEC OTC) 20 MG tablet   No current facility-administered medications for this encounter.    Velna Ochsllison Latreshia Beauchaine, PA-C [facility] Short Stay Center/Anesthesiology Phone [phone] 08/05/2017 4:20 PM

## 2017-08-08 NOTE — Anesthesia Preprocedure Evaluation (Addendum)
Anesthesia Evaluation  Patient identified by MRN, date of birth, ID band Patient awake    Reviewed: Allergy & Precautions, NPO status , Patient's Chart, lab work & pertinent test results  Airway Mallampati: II  TM Distance: >3 FB Neck ROM: Full    Dental  (+) Edentulous Upper, Edentulous Lower   Pulmonary Current Smoker,    breath sounds clear to auscultation       Cardiovascular hypertension, + Peripheral Vascular Disease   Rhythm:Regular Rate:Normal     Neuro/Psych negative neurological ROS     GI/Hepatic Neg liver ROS, GERD  ,  Endo/Other  diabetes  Renal/GU negative Renal ROS     Musculoskeletal negative musculoskeletal ROS (+)   Abdominal   Peds  Hematology negative hematology ROS (+)   Anesthesia Other Findings   Reproductive/Obstetrics                            Anesthesia Physical Anesthesia Plan  ASA: III  Anesthesia Plan: General   Post-op Pain Management:    Induction: Intravenous  PONV Risk Score and Plan: 3 and Dexamethasone, Ondansetron and Treatment may vary due to age or medical condition  Airway Management Planned: Oral ETT  Additional Equipment:   Intra-op Plan:   Post-operative Plan: Extubation in OR  Informed Consent: I have reviewed the patients History and Physical, chart, labs and discussed the procedure including the risks, benefits and alternatives for the proposed anesthesia with the patient or authorized representative who has indicated his/her understanding and acceptance.   Dental advisory given  Plan Discussed with: CRNA  Anesthesia Plan Comments:         Anesthesia Quick Evaluation

## 2017-08-09 ENCOUNTER — Inpatient Hospital Stay (HOSPITAL_COMMUNITY)
Admission: RE | Admit: 2017-08-09 | Discharge: 2017-08-10 | DRG: 254 | Disposition: A | Discharge status: Home Health | Payer: Medicare Other | Source: Ambulatory Visit | Attending: Vascular Surgery | Admitting: Vascular Surgery

## 2017-08-09 ENCOUNTER — Inpatient Hospital Stay (HOSPITAL_COMMUNITY): Payer: Medicare Other | Admitting: Vascular Surgery

## 2017-08-09 ENCOUNTER — Inpatient Hospital Stay (HOSPITAL_COMMUNITY): Payer: Medicare Other

## 2017-08-09 ENCOUNTER — Encounter (HOSPITAL_COMMUNITY)
Admission: RE | Disposition: A | Discharge status: Home Health | Payer: Self-pay | Source: Ambulatory Visit | Attending: Vascular Surgery

## 2017-08-09 ENCOUNTER — Encounter (HOSPITAL_COMMUNITY): Payer: Self-pay | Admitting: Anesthesiology

## 2017-08-09 DIAGNOSIS — F17210 Nicotine dependence, cigarettes, uncomplicated: Secondary | ICD-10-CM | POA: Diagnosis present

## 2017-08-09 DIAGNOSIS — I10 Essential (primary) hypertension: Secondary | ICD-10-CM | POA: Diagnosis present

## 2017-08-09 DIAGNOSIS — Z79899 Other long term (current) drug therapy: Secondary | ICD-10-CM

## 2017-08-09 DIAGNOSIS — I739 Peripheral vascular disease, unspecified: Secondary | ICD-10-CM | POA: Diagnosis present

## 2017-08-09 DIAGNOSIS — Z794 Long term (current) use of insulin: Secondary | ICD-10-CM

## 2017-08-09 DIAGNOSIS — E1151 Type 2 diabetes mellitus with diabetic peripheral angiopathy without gangrene: Secondary | ICD-10-CM | POA: Diagnosis present

## 2017-08-09 DIAGNOSIS — Z716 Tobacco abuse counseling: Secondary | ICD-10-CM | POA: Diagnosis not present

## 2017-08-09 DIAGNOSIS — K219 Gastro-esophageal reflux disease without esophagitis: Secondary | ICD-10-CM | POA: Diagnosis present

## 2017-08-09 DIAGNOSIS — Z419 Encounter for procedure for purposes other than remedying health state, unspecified: Secondary | ICD-10-CM

## 2017-08-09 DIAGNOSIS — I998 Other disorder of circulatory system: Secondary | ICD-10-CM

## 2017-08-09 DIAGNOSIS — Z818 Family history of other mental and behavioral disorders: Secondary | ICD-10-CM | POA: Diagnosis not present

## 2017-08-09 DIAGNOSIS — Z8249 Family history of ischemic heart disease and other diseases of the circulatory system: Secondary | ICD-10-CM | POA: Diagnosis not present

## 2017-08-09 DIAGNOSIS — Z7982 Long term (current) use of aspirin: Secondary | ICD-10-CM

## 2017-08-09 HISTORY — PX: FEMORAL-POPLITEAL BYPASS GRAFT: SHX937

## 2017-08-09 HISTORY — PX: FEMORAL ARTERY - POPLITEAL ARTERY BYPASS GRAFT: SUR180

## 2017-08-09 HISTORY — PX: INTRAOPERATIVE ARTERIOGRAM: SHX5157

## 2017-08-09 LAB — CBC
HEMATOCRIT: 34.1 % — AB (ref 39.0–52.0)
Hemoglobin: 11.5 g/dL — ABNORMAL LOW (ref 13.0–17.0)
MCH: 32.6 pg (ref 26.0–34.0)
MCHC: 33.7 g/dL (ref 30.0–36.0)
MCV: 96.6 fL (ref 78.0–100.0)
Platelets: 235 10*3/uL (ref 150–400)
RBC: 3.53 MIL/uL — AB (ref 4.22–5.81)
RDW: 11.9 % (ref 11.5–15.5)
WBC: 9.3 10*3/uL (ref 4.0–10.5)

## 2017-08-09 LAB — GLUCOSE, CAPILLARY
GLUCOSE-CAPILLARY: 285 mg/dL — AB (ref 65–99)
GLUCOSE-CAPILLARY: 254 mg/dL — AB (ref 65–99)
Glucose-Capillary: 262 mg/dL — ABNORMAL HIGH (ref 65–99)
Glucose-Capillary: 239 mg/dL — ABNORMAL HIGH (ref 65–99)

## 2017-08-09 LAB — CREATININE, SERUM
Creatinine, Ser: 0.82 mg/dL (ref 0.61–1.24)
GFR calc non Af Amer: >60 mL/min (ref >60)

## 2017-08-09 SURGERY — BYPASS GRAFT FEMORAL-POPLITEAL ARTERY
Anesthesia: General | Site: Leg Lower | Laterality: Right

## 2017-08-09 MED ORDER — PROTAMINE SULFATE 10 MG/ML IV SOLN
INTRAVENOUS | Status: AC
  Filled 2017-08-09: qty 5

## 2017-08-09 MED ORDER — THROMBIN 20000 UNITS EX SOLR
CUTANEOUS | Status: DC | PRN
  Administered 2017-08-09: 09:00:00 via TOPICAL

## 2017-08-09 MED ORDER — PROTAMINE SULFATE 10 MG/ML IV SOLN
INTRAVENOUS | Status: DC | PRN
  Administered 2017-08-09: 30 mg via INTRAVENOUS

## 2017-08-09 MED ORDER — PHENYLEPHRINE HCL 10 MG/ML IJ SOLN
INTRAVENOUS | Status: DC | PRN
  Administered 2017-08-09: 50 ug/min via INTRAVENOUS

## 2017-08-09 MED ORDER — PROMETHAZINE HCL 25 MG/ML IJ SOLN: 6.2500 mg | INTRAMUSCULAR | Status: DC | PRN

## 2017-08-09 MED ORDER — SODIUM CHLORIDE 0.9 % IV SOLN
INTRAVENOUS | Status: AC
  Filled 2017-08-09: qty 1.2

## 2017-08-09 MED ORDER — DOCUSATE SODIUM 100 MG PO CAPS
100.0000 mg | ORAL_CAPSULE | Freq: Every day | ORAL | Status: DC
  Administered 2017-08-10: 100 mg via ORAL
  Filled 2017-08-09: qty 1

## 2017-08-09 MED ORDER — MIDAZOLAM HCL 5 MG/5ML IJ SOLN
INTRAMUSCULAR | Status: DC | PRN
  Administered 2017-08-09: 2 mg via INTRAVENOUS

## 2017-08-09 MED ORDER — ROCURONIUM BROMIDE 10 MG/ML (PF) SYRINGE
PREFILLED_SYRINGE | INTRAVENOUS | Status: AC
  Filled 2017-08-09: qty 5

## 2017-08-09 MED ORDER — ACETAMINOPHEN 325 MG RE SUPP: 325.0000 mg | RECTAL | Status: DC | PRN

## 2017-08-09 MED ORDER — LACTATED RINGERS IV SOLN
INTRAVENOUS | Status: DC | PRN
  Administered 2017-08-09: 07:00:00 via INTRAVENOUS

## 2017-08-09 MED ORDER — PHENOL 1.4 % MT LIQD: 1.0000 | OROMUCOSAL | Status: DC | PRN

## 2017-08-09 MED ORDER — CEFAZOLIN SODIUM-DEXTROSE 2-4 GM/100ML-% IV SOLN
2.0000 g | Freq: Three times a day (TID) | INTRAVENOUS | Status: AC
  Administered 2017-08-09 – 2017-08-10 (×2): 2 g via INTRAVENOUS
  Filled 2017-08-09 (×2): qty 100

## 2017-08-09 MED ORDER — OXYCODONE-ACETAMINOPHEN 5-325 MG PO TABS
1.0000 | ORAL_TABLET | ORAL | Status: DC | PRN
  Administered 2017-08-09: 1 via ORAL
  Filled 2017-08-09: qty 1

## 2017-08-09 MED ORDER — CEFAZOLIN SODIUM-DEXTROSE 2-4 GM/100ML-% IV SOLN
INTRAVENOUS | Status: AC
  Filled 2017-08-09: qty 100

## 2017-08-09 MED ORDER — HEPARIN SODIUM (PORCINE) 1000 UNIT/ML IJ SOLN
INTRAMUSCULAR | Status: AC
  Filled 2017-08-09: qty 1

## 2017-08-09 MED ORDER — MAGNESIUM SULFATE 2 GM/50ML IV SOLN: 2.0000 g | Freq: Every day | INTRAVENOUS | Status: DC | PRN

## 2017-08-09 MED ORDER — LISINOPRIL 5 MG PO TABS
7.5000 mg | ORAL_TABLET | Freq: Every day | ORAL | Status: DC
  Administered 2017-08-10: 7.5 mg via ORAL
  Filled 2017-08-09: qty 1

## 2017-08-09 MED ORDER — INSULIN GLARGINE 100 UNIT/ML ~~LOC~~ SOLN
12.0000 [IU] | Freq: Every day | SUBCUTANEOUS | Status: DC
  Administered 2017-08-09: 12 [IU] via SUBCUTANEOUS
  Filled 2017-08-09: qty 0.12

## 2017-08-09 MED ORDER — 0.9 % SODIUM CHLORIDE (POUR BTL) OPTIME
TOPICAL | Status: DC | PRN
  Administered 2017-08-09: 2000 mL

## 2017-08-09 MED ORDER — SIMVASTATIN 20 MG PO TABS
20.0000 mg | ORAL_TABLET | Freq: Every day | ORAL | Status: DC
  Administered 2017-08-09: 20 mg via ORAL
  Filled 2017-08-09: qty 1

## 2017-08-09 MED ORDER — SODIUM CHLORIDE 0.9 % IV SOLN
INTRAVENOUS | Status: DC | PRN
  Administered 2017-08-09: 09:00:00

## 2017-08-09 MED ORDER — LIDOCAINE 2% (20 MG/ML) 5 ML SYRINGE
INTRAMUSCULAR | Status: DC | PRN
  Administered 2017-08-09: 60 mg via INTRAVENOUS

## 2017-08-09 MED ORDER — MIDAZOLAM HCL 2 MG/2ML IJ SOLN
INTRAMUSCULAR | Status: AC
  Filled 2017-08-09: qty 2

## 2017-08-09 MED ORDER — METOPROLOL TARTRATE 5 MG/5ML IV SOLN: 2.0000 mg | INTRAVENOUS | Status: DC | PRN

## 2017-08-09 MED ORDER — SUGAMMADEX SODIUM 200 MG/2ML IV SOLN
INTRAVENOUS | Status: AC
  Filled 2017-08-09: qty 2

## 2017-08-09 MED ORDER — SUGAMMADEX SODIUM 200 MG/2ML IV SOLN
INTRAVENOUS | Status: DC | PRN
  Administered 2017-08-09: 100 mg via INTRAVENOUS

## 2017-08-09 MED ORDER — PROPOFOL 10 MG/ML IV BOLUS
INTRAVENOUS | Status: DC | PRN
  Administered 2017-08-09: 150 mg via INTRAVENOUS

## 2017-08-09 MED ORDER — ALPRAZOLAM 0.5 MG PO TABS: 1.0000 mg | ORAL_TABLET | Freq: Two times a day (BID) | ORAL | Status: DC | PRN

## 2017-08-09 MED ORDER — IODIXANOL 320 MG/ML IV SOLN
INTRAVENOUS | Status: DC | PRN
  Administered 2017-08-09: 50 mL

## 2017-08-09 MED ORDER — INSULIN ASPART 100 UNIT/ML ~~LOC~~ SOLN
0.0000 [IU] | Freq: Three times a day (TID) | SUBCUTANEOUS | Status: DC
  Administered 2017-08-09: 5 [IU] via SUBCUTANEOUS
  Administered 2017-08-10: 3 [IU] via SUBCUTANEOUS

## 2017-08-09 MED ORDER — SODIUM CHLORIDE 0.9 % IV SOLN
500.0000 mL | Freq: Once | INTRAVENOUS | Status: AC | PRN
  Administered 2017-08-09: 500 mL via INTRAVENOUS

## 2017-08-09 MED ORDER — ONDANSETRON HCL 4 MG/2ML IJ SOLN
INTRAMUSCULAR | Status: DC | PRN
  Administered 2017-08-09: 4 mg via INTRAVENOUS

## 2017-08-09 MED ORDER — PROPOFOL 10 MG/ML IV BOLUS
INTRAVENOUS | Status: AC
  Filled 2017-08-09: qty 20

## 2017-08-09 MED ORDER — HEPARIN SODIUM (PORCINE) 1000 UNIT/ML IJ SOLN
INTRAMUSCULAR | Status: DC | PRN
  Administered 2017-08-09: 6000 [IU] via INTRAVENOUS

## 2017-08-09 MED ORDER — HYDROMORPHONE HCL 2 MG/ML IJ SOLN
0.2500 mg | INTRAMUSCULAR | Status: DC | PRN
  Administered 2017-08-09: 0.25 mg via INTRAVENOUS
  Administered 2017-08-09: 0.3 mg via INTRAVENOUS

## 2017-08-09 MED ORDER — CEFAZOLIN SODIUM-DEXTROSE 2-4 GM/100ML-% IV SOLN
2.0000 g | INTRAVENOUS | Status: AC
  Administered 2017-08-09: 2 g via INTRAVENOUS

## 2017-08-09 MED ORDER — ACETAMINOPHEN 325 MG PO TABS: 325.0000 mg | ORAL_TABLET | ORAL | Status: DC | PRN

## 2017-08-09 MED ORDER — PANTOPRAZOLE SODIUM 40 MG PO TBEC
40.0000 mg | DELAYED_RELEASE_TABLET | Freq: Every day | ORAL | Status: DC
  Administered 2017-08-10: 40 mg via ORAL
  Filled 2017-08-09: qty 1

## 2017-08-09 MED ORDER — ASPIRIN EC 81 MG PO TBEC
81.0000 mg | DELAYED_RELEASE_TABLET | Freq: Every day | ORAL | Status: DC
  Administered 2017-08-10: 81 mg via ORAL
  Filled 2017-08-09: qty 1

## 2017-08-09 MED ORDER — GUAIFENESIN-DM 100-10 MG/5ML PO SYRP: 15.0000 mL | ORAL_SOLUTION | ORAL | Status: DC | PRN

## 2017-08-09 MED ORDER — FENTANYL CITRATE (PF) 250 MCG/5ML IJ SOLN
INTRAMUSCULAR | Status: AC
  Filled 2017-08-09: qty 5

## 2017-08-09 MED ORDER — LABETALOL HCL 5 MG/ML IV SOLN: 10.0000 mg | INTRAVENOUS | Status: DC | PRN

## 2017-08-09 MED ORDER — HYDRALAZINE HCL 20 MG/ML IJ SOLN: 5.0000 mg | INTRAMUSCULAR | Status: DC | PRN

## 2017-08-09 MED ORDER — FENTANYL CITRATE (PF) 100 MCG/2ML IJ SOLN
INTRAMUSCULAR | Status: DC | PRN
  Administered 2017-08-09: 100 ug via INTRAVENOUS

## 2017-08-09 MED ORDER — MORPHINE SULFATE (PF) 4 MG/ML IV SOLN: 4.0000 mg | INTRAVENOUS | Status: DC | PRN

## 2017-08-09 MED ORDER — SODIUM CHLORIDE 0.9 % IV SOLN
INTRAVENOUS | Status: DC
  Administered 2017-08-09: 16:00:00 via INTRAVENOUS

## 2017-08-09 MED ORDER — ROCURONIUM BROMIDE 100 MG/10ML IV SOLN
INTRAVENOUS | Status: DC | PRN
  Administered 2017-08-09: 30 mg via INTRAVENOUS
  Administered 2017-08-09: 50 mg via INTRAVENOUS

## 2017-08-09 MED ORDER — LIDOCAINE 2% (20 MG/ML) 5 ML SYRINGE
INTRAMUSCULAR | Status: AC
  Filled 2017-08-09: qty 5

## 2017-08-09 MED ORDER — SODIUM CHLORIDE 0.9 % IV SOLN: INTRAVENOUS | Status: DC

## 2017-08-09 MED ORDER — ALUM & MAG HYDROXIDE-SIMETH 200-200-20 MG/5ML PO SUSP: 15.0000 mL | ORAL | Status: DC | PRN

## 2017-08-09 MED ORDER — POTASSIUM CHLORIDE CRYS ER 20 MEQ PO TBCR: 20.0000 meq | EXTENDED_RELEASE_TABLET | Freq: Every day | ORAL | Status: DC | PRN

## 2017-08-09 MED ORDER — POLYETHYLENE GLYCOL 3350 17 G PO PACK: 17.0000 g | PACK | Freq: Every day | ORAL | Status: DC | PRN

## 2017-08-09 MED ORDER — THROMBIN 20000 UNITS EX SOLR
CUTANEOUS | Status: AC
  Filled 2017-08-09: qty 20000

## 2017-08-09 MED ORDER — ONDANSETRON HCL 4 MG/2ML IJ SOLN
INTRAMUSCULAR | Status: AC
  Filled 2017-08-09: qty 2

## 2017-08-09 MED ORDER — OMEPRAZOLE MAGNESIUM 20 MG PO TBEC: 20.0000 mg | DELAYED_RELEASE_TABLET | Freq: Every day | ORAL | Status: DC | PRN

## 2017-08-09 MED ORDER — CHLORHEXIDINE GLUCONATE CLOTH 2 % EX PADS: 6.0000 | MEDICATED_PAD | Freq: Once | CUTANEOUS | Status: DC

## 2017-08-09 MED ORDER — HYDROMORPHONE HCL 2 MG/ML IJ SOLN
INTRAMUSCULAR | Status: AC
  Administered 2017-08-09: 17:00:00
  Filled 2017-08-09: qty 1

## 2017-08-09 MED ORDER — IMIPRAMINE HCL 25 MG PO TABS
25.0000 mg | ORAL_TABLET | Freq: Every day | ORAL | Status: DC
  Administered 2017-08-09: 25 mg via ORAL
  Filled 2017-08-09: qty 1

## 2017-08-09 MED ORDER — ONDANSETRON HCL 4 MG/2ML IJ SOLN: 4.0000 mg | Freq: Four times a day (QID) | INTRAMUSCULAR | Status: DC | PRN

## 2017-08-09 MED ORDER — HEPARIN SODIUM (PORCINE) 5000 UNIT/ML IJ SOLN
5000.0000 [IU] | Freq: Three times a day (TID) | INTRAMUSCULAR | Status: DC
  Administered 2017-08-09 – 2017-08-10 (×2): 5000 [IU] via SUBCUTANEOUS
  Filled 2017-08-09 (×2): qty 1

## 2017-08-09 MED ORDER — PAPAVERINE HCL 30 MG/ML IJ SOLN
INTRAMUSCULAR | Status: AC
  Filled 2017-08-09: qty 2

## 2017-08-09 SURGICAL SUPPLY — 61 items
ADH SKN CLS APL DERMABOND .7 (GAUZE/BANDAGES/DRESSINGS) ×4
BANDAGE ELASTIC 4 VELCRO ST LF (GAUZE/BANDAGES/DRESSINGS) ×2 IMPLANT
BANDAGE ESMARK 6X9 LF (GAUZE/BANDAGES/DRESSINGS) IMPLANT
BNDG CMPR 9X6 STRL LF SNTH (GAUZE/BANDAGES/DRESSINGS) ×2
BNDG ESMARK 6X9 LF (GAUZE/BANDAGES/DRESSINGS) ×4
CANISTER SUCT 3000ML PPV (MISCELLANEOUS) ×4 IMPLANT
CANNULA VESSEL 3MM 2 BLNT TIP (CANNULA) ×8 IMPLANT
CLIP VESOCCLUDE MED 24/CT (CLIP) ×4 IMPLANT
CLIP VESOCCLUDE SM WIDE 24/CT (CLIP) ×4 IMPLANT
CUFF TOURNIQUET SINGLE 24IN (TOURNIQUET CUFF) ×2 IMPLANT
DERMABOND ADVANCED (GAUZE/BANDAGES/DRESSINGS) ×4
DERMABOND ADVANCED .7 DNX12 (GAUZE/BANDAGES/DRESSINGS) ×2 IMPLANT
DRAIN CHANNEL 15F RND FF W/TCR (WOUND CARE) IMPLANT
DRAPE X-RAY CASS 24X20 (DRAPES) ×2 IMPLANT
ELECT REM PT RETURN 9FT ADLT (ELECTROSURGICAL) ×4
ELECTRODE REM PT RTRN 9FT ADLT (ELECTROSURGICAL) ×2 IMPLANT
EVACUATOR SILICONE 100CC (DRAIN) IMPLANT
GAUZE SPONGE 4X4 16PLY XRAY LF (GAUZE/BANDAGES/DRESSINGS) ×2 IMPLANT
GLOVE BIO SURGEON STRL SZ 6.5 (GLOVE) ×1 IMPLANT
GLOVE BIO SURGEON STRL SZ7.5 (GLOVE) ×8 IMPLANT
GLOVE BIO SURGEONS STRL SZ 6.5 (GLOVE) ×1
GLOVE BIOGEL PI IND STRL 6 (GLOVE) IMPLANT
GLOVE BIOGEL PI IND STRL 6.5 (GLOVE) IMPLANT
GLOVE BIOGEL PI IND STRL 7.0 (GLOVE) IMPLANT
GLOVE BIOGEL PI IND STRL 8 (GLOVE) ×2 IMPLANT
GLOVE BIOGEL PI INDICATOR 6 (GLOVE) ×2
GLOVE BIOGEL PI INDICATOR 6.5 (GLOVE) ×10
GLOVE BIOGEL PI INDICATOR 7.0 (GLOVE) ×4
GLOVE BIOGEL PI INDICATOR 8 (GLOVE) ×2
GLOVE SURG SS PI 6.0 STRL IVOR (GLOVE) ×4 IMPLANT
GOWN STRL REUS W/ TWL LRG LVL3 (GOWN DISPOSABLE) ×6 IMPLANT
GOWN STRL REUS W/ TWL XL LVL3 (GOWN DISPOSABLE) IMPLANT
GOWN STRL REUS W/TWL LRG LVL3 (GOWN DISPOSABLE) ×24
GOWN STRL REUS W/TWL XL LVL3 (GOWN DISPOSABLE) ×4
GRAFT PROPATEN THIN WALL 6X80 (Vascular Products) ×2 IMPLANT
INSERT FOGARTY SM (MISCELLANEOUS) ×2 IMPLANT
KIT BASIN OR (CUSTOM PROCEDURE TRAY) ×4 IMPLANT
KIT TURNOVER KIT B (KITS) ×4 IMPLANT
NS IRRIG 1000ML POUR BTL (IV SOLUTION) ×8 IMPLANT
PACK PERIPHERAL VASCULAR (CUSTOM PROCEDURE TRAY) ×4 IMPLANT
PAD ARMBOARD 7.5X6 YLW CONV (MISCELLANEOUS) ×8 IMPLANT
SET COLLECT BLD 21X3/4 12 (NEEDLE) ×2 IMPLANT
SPONGE SURGIFOAM ABS GEL 100 (HEMOSTASIS) ×2 IMPLANT
STOPCOCK 4 WAY LG BORE MALE ST (IV SETS) ×2 IMPLANT
SUT ETHILON 3 0 PS 1 (SUTURE) IMPLANT
SUT PROLENE 5 0 C 1 24 (SUTURE) ×4 IMPLANT
SUT PROLENE 6 0 BV (SUTURE) ×10 IMPLANT
SUT SILK 2 0 PERMA HAND 18 BK (SUTURE) ×4 IMPLANT
SUT SILK 2 0 SH (SUTURE) ×4 IMPLANT
SUT SILK 3 0 (SUTURE)
SUT SILK 3-0 18XBRD TIE 12 (SUTURE) IMPLANT
SUT VIC AB 2-0 CTB1 (SUTURE) ×8 IMPLANT
SUT VIC AB 3-0 SH 27 (SUTURE) ×8
SUT VIC AB 3-0 SH 27X BRD (SUTURE) ×4 IMPLANT
SUT VICRYL 4-0 PS2 18IN ABS (SUTURE) ×8 IMPLANT
SYR 20CC LL (SYRINGE) ×2 IMPLANT
TOWEL GREEN STERILE (TOWEL DISPOSABLE) ×4 IMPLANT
TRAY FOLEY MTR SLVR 16FR STAT (SET/KITS/TRAYS/PACK) ×4 IMPLANT
TUBING EXTENTION W/L.L. (IV SETS) ×2 IMPLANT
UNDERPAD 30X30 (UNDERPADS AND DIAPERS) ×4 IMPLANT
WATER STERILE IRR 1000ML POUR (IV SOLUTION) ×4 IMPLANT

## 2017-08-09 NOTE — Interval H&P Note (Signed)
History and Physical Interval Note:  08/09/2017 7:23 AM  Jay BlazerJason B Hudock  has presented today for surgery, with the diagnosis of claudication  The various methods of treatment have been discussed with the patient and family. After consideration of risks, benefits and other options for treatment, the patient has consented to  Procedure(s): BYPASS GRAFT FEMORAL-BELOW KNEE POPLITEAL ARTERY POSSIBLE PTFE GRAFT (Right) as a surgical intervention .  [name] history has been reviewed, patient examined, no change in status, stable for surgery.  I have reviewed the patient's chart and labs.  Questions were answered to the patient's satisfaction.     Waverly Ferrarihristopher Khian Remo

## 2017-08-09 NOTE — Progress Notes (Signed)
Lianne CureMaureen Collins, PA paged at this time

## 2017-08-09 NOTE — Progress Notes (Signed)
    Post op patient alert and oriented Pain is well controlled, foley is bothering him and asked if it could be removed. Doppler right LE DP/PT signals Incisions clean and dry, right groin soft without hematoma  S/P  1.  Right femoral to below-knee popliteal artery bypass with 6 mm PTFE 2.  Intraoperative arteriogram  Foley discontinued with the understanding that if can't void within 6 hours it will be replaced.  Patient is in agreement.     Pending room on [address]E  Mosetta PigeonEmma Maureen Brennyn Ortlieb PA-C

## 2017-08-09 NOTE — Op Note (Signed)
NAME: SPIRO AUSBORN    MRN: [id number] DOB: 04-May-1980    DATE OF OPERATION: 08/09/2017  PREOP DIAGNOSIS:    Critical limb ischemia right lower extremity  POSTOP DIAGNOSIS:    Same  PROCEDURE:    1.  Right femoral to below-knee popliteal artery bypass with 6 mm PTFE 2.  Intraoperative arteriogram  SURGEON: Di Kindle. Edilia Bo, MD, FACS  ASSIST: Aggie Moats, PA  ANESTHESIA: General  EBL: 100 cc  INDICATIONS:    Jay Barber is a 37 y.o. male who presented with infrainguinal arterial occlusive disease and 2 nonhealing wounds on his right foot.  It was felt that revascularization was his best chance for limb salvage.  FINDINGS:   Completion arteriogram shows two-vessel runoff via the posterior tibial artery and peroneal artery.  TECHNIQUE:   The patient was taken to the operating room and received a general anesthetic.  I looked at the saphenous vein myself with the SonoSite and it was very small.  This confirmed the findings on his preoperative vein mapping.  I do not think the vein was adequate in size.  Nor is the vein in the left side adequate in size.  A longitudinal incision was made in the right groin.  The common femoral artery was dissected free proximally and distally such that a clamp could be applied.  A soft spot anteriorly was identified in the artery.  A separate longitudinal incision was made below the knee and the saphenous vein which was small was preserved.  The dissection was carried down to the below-knee popliteal artery which was patent at this level.  A tunnel was graded between the 2 incisions and the patient was heparinized.  A 6 mm PTFE graft was tunneled between the 2 incisions.  Attention was first turned to the proximal anastomosis.  The common femoral artery was clamped proximally distally and a longitudinal arteriotomy was made.  There was some posterior plaque and plaque along the medial aspect of the artery.  The proximal graft was  widely spatulated and sewn into side to the artery using continuous 6-0 Prolene suture.  Prior to completing the anastomosis, the graft was clamped.  The artery was backbled and flushed appropriately and there was excellent inflow.  The anastomosis was completed.  The graft was then pulled the appropriate length for anastomosis to the below-knee popliteal artery.  Tourniquet was then placed on the thigh.  The leg was exsanguinated with an Esmarch bandage and a longitudinal arteriotomy was made in the below-knee popliteal artery.  The graft was cut the appropriate length, spatulated and sewn end-to-side to the below-knee popliteal artery using continuous 6-0 Prolene suture.  Prior to completing the anastomosis, the tourniquet was released.  There was good backbleeding.  The graft was flushed.  The anastomosis was completed.  Flow was reestablished to the right foot.  There was a good posterior tibial and peroneal signal at the completion.  An intraoperative arteriogram was obtained by cannulating the proximal graft.  This showed no technical problems.  Hemostasis was obtained in the wounds and the heparin was partially reversed with protamine.  The groin wound was closed with a deep layer of 2-0 Vicryl, a subcutaneous layer with 3-0 Vicryl and the skin closed with 4-0 Vicryl.  The below the knee incision was closed with a deep layer of 2-0 Vicryl, a subcutaneous layer with 3-0 Vicryl and the skin closed with 4-0 Vicryl.  Sterile dressing was applied.  Patient tolerated the procedure well  and was transferred to the recovery room in stable condition.  All needle and sponge counts were correct.  Waverly Ferrarihristopher Tanesia Butner, MD, FACS [facility]  DATE OF DICTATION:   08/09/2017

## 2017-08-09 NOTE — Anesthesia Postprocedure Evaluation (Signed)
Anesthesia Post Note  Patient: Jay BlazerJason B Barber  Procedure(s) Performed: BYPASS GRAFT FEMORAL-BELOW KNEE POPLITEAL ARTERY USING 6MM X 80CM GORE PROPATEN VASCULAR GRAFT (Right ) INTRA OPERATIVE ARTERIOGRAM RIGHT LOWER LEG (Right Leg Lower)     Patient location during evaluation: PACU Anesthesia Type: General Level of consciousness: awake and sedated Pain management: pain level controlled Vital Signs Assessment: post-procedure vital signs reviewed and stable Respiratory status: spontaneous breathing, nonlabored ventilation, respiratory function stable and patient connected to nasal cannula oxygen Cardiovascular status: blood pressure returned to baseline and stable Postop Assessment: no apparent nausea or vomiting Anesthetic complications: no    Last Vitals:  Vitals:   08/09/17 1505 08/09/17 1529  BP: 117/67 (!) 148/83  Pulse: 98 96  Resp: (!) 26 12  Temp:  36.7 C  SpO2: 98% 97%    Last Pain:  Vitals:   08/09/17 1529  TempSrc: Oral  PainSc:                  Nona Gracey,JAMES TERRILL

## 2017-08-09 NOTE — Anesthesia Procedure Notes (Signed)
Procedure Name: Intubation Date/Time: 08/09/2017 7:39 AM Performed by: Montez Moritaarter, Odes Lolli W, CRNA Pre-anesthesia Checklist: Patient identified, Emergency Drugs available, Suction available and Patient being monitored Patient Re-evaluated:Patient Re-evaluated prior to induction Oxygen Delivery Method: Circle system utilized Preoxygenation: Pre-oxygenation with 100% oxygen Induction Type: IV induction Ventilation: Mask ventilation without difficulty Laryngoscope Size: Miller and 2 Grade View: Grade I Tube type: Oral Tube size: 7.5 mm Number of attempts: 1 Airway Equipment and Method: Stylet and Oral airway Placement Confirmation: ETT inserted through vocal cords under direct vision,  positive ETCO2 and breath sounds checked- equal and bilateral Secured at: 24 cm Tube secured with: Tape Dental Injury: Teeth and Oropharynx as per pre-operative assessment

## 2017-08-09 NOTE — Progress Notes (Signed)
Patient requesting to have catheter removed due to discomfort. Jay HumbleMaureen, [place] made aware and states catheter can be removed but needs to be reinserted if patient has not urinated in 6 hours. Catheter removed at 14:10.

## 2017-08-09 NOTE — Transfer of Care (Signed)
Immediate Anesthesia Transfer of Care Note  Patient: Jay Barber  Procedure(s) Performed: BYPASS GRAFT FEMORAL-BELOW KNEE POPLITEAL ARTERY USING 6MM X 80CM GORE PROPATEN VASCULAR GRAFT (Right ) INTRA OPERATIVE ARTERIOGRAM RIGHT LOWER LEG (Right Leg Lower)  Patient Location: PACU  Anesthesia Type:General  Level of Consciousness: awake  Airway & Oxygen Therapy: Patient Spontanous Breathing and Patient connected to face mask oxygen  Post-op Assessment: Report given to RN and Post -op Vital signs reviewed and stable  Post vital signs: Reviewed and stable  Last Vitals:  Vitals Value Taken Time  BP    Temp    Pulse 82 08/09/2017 10:25 AM  Resp 13 08/09/2017 10:25 AM  SpO2 99 % 08/09/2017 10:25 AM  Vitals shown include unvalidated device data.  Last Pain:  Vitals:   08/09/17 0649  PainSc: 0-No pain      Patients Stated Pain Goal: 3 (08/09/17 [id number])  Complications: No apparent anesthesia complications

## 2017-08-09 NOTE — Progress Notes (Signed)
Attempted to notify MD that CBG is 262, Heather answered MD's phone, [phone], stated MD was unavailable & would verbally relay message to him.

## 2017-08-10 ENCOUNTER — Encounter (HOSPITAL_COMMUNITY): Payer: Self-pay | Admitting: General Practice

## 2017-08-10 ENCOUNTER — Other Ambulatory Visit: Payer: Self-pay

## 2017-08-10 ENCOUNTER — Telehealth: Payer: Self-pay | Admitting: Vascular Surgery

## 2017-08-10 ENCOUNTER — Encounter (HOSPITAL_COMMUNITY): Payer: Medicaid Other

## 2017-08-10 LAB — GLUCOSE, CAPILLARY
GLUCOSE-CAPILLARY: 65 mg/dL (ref 65–99)
Glucose-Capillary: 58 mg/dL — ABNORMAL LOW (ref 65–99)
Glucose-Capillary: 153 mg/dL — ABNORMAL HIGH (ref 65–99)
Glucose-Capillary: 224 mg/dL — ABNORMAL HIGH (ref 65–99)
Glucose-Capillary: 102 mg/dL — ABNORMAL HIGH (ref 65–99)

## 2017-08-10 LAB — BASIC METABOLIC PANEL
ANION GAP: 6 (ref 5–15)
BUN: 11 mg/dL (ref 6–20)
CALCIUM: 8.7 mg/dL — AB (ref 8.9–10.3)
CO2: 28 mmol/L (ref 22–32)
CREATININE: 0.81 mg/dL (ref 0.61–1.24)
Chloride: 98 mmol/L — ABNORMAL LOW (ref 101–111)
GFR calc non Af Amer: >60 mL/min (ref >60)
Glucose, Bld: 191 mg/dL — ABNORMAL HIGH (ref 65–99)
Potassium: 4.2 mmol/L (ref 3.5–5.1)
SODIUM: 132 mmol/L — AB (ref 135–145)

## 2017-08-10 LAB — CBC
HCT: 33.2 % — ABNORMAL LOW (ref 39.0–52.0)
HEMOGLOBIN: 11.4 g/dL — AB (ref 13.0–17.0)
MCH: 32.7 pg (ref 26.0–34.0)
MCHC: 34.3 g/dL (ref 30.0–36.0)
MCV: 95.1 fL (ref 78.0–100.0)
Platelets: 236 10*3/uL (ref 150–400)
RBC: 3.49 MIL/uL — ABNORMAL LOW (ref 4.22–5.81)
RDW: 11.9 % (ref 11.5–15.5)
WBC: 8.2 10*3/uL (ref 4.0–10.5)

## 2017-08-10 MED ORDER — OXYCODONE-ACETAMINOPHEN 5-325 MG PO TABS: 1.0000 | ORAL_TABLET | Freq: Four times a day (QID) | ORAL | 0 refills | Status: DC | PRN

## 2017-08-10 MED ORDER — SIMVASTATIN 20 MG PO TABS: 20.0000 mg | ORAL_TABLET | Freq: Every day | ORAL | 5 refills | Status: AC

## 2017-08-10 NOTE — Progress Notes (Signed)
Order received to discharge patient.  Telemetry monitor removed and CCMD notified.  PIV access removed.  Discharge instructions, follow up, medications and instructions for their use discussed with patient. 

## 2017-08-10 NOTE — Care Management Note (Signed)
Case Management Note Donn PieriniKristi Merritt Mccravy RN, BSN Unit 4E-Case Manager [phone]  Patient Details  Name: Jay BlazerJason B Barber MRN: [id number] Date of Birth: Sep 16, 1980  Subjective/Objective:  Pt admitted s/p fempop bypass                  Action/Plan: PTA Pt lived at home, notified by Elmarie Shileyiffany with [facility] that pt has pre-op referral to [facility]- pt for discharge home today- alerted Tiffany of pt's transition home today- they will f[facility] the [facility].   Expected Discharge Date:  08/10/17               Expected Discharge Plan:  Home w Home Health Services  In-House Referral:  NA  Discharge planning Services  CM Consult  Post Acute Care Choice:  Home Health Choice offered to:     DME Arranged:    DME Agency:     HH Arranged:    HH Agency:  [facility]  Status of Service:  Completed, signed off  If discussed at Long Length of Stay Meetings, dates discussed:    Discharge Disposition: home/home health   Additional Comments:  Darrold SpanWebster, Elim Peale Hall, RN 08/10/2017, 2:08 PM

## 2017-08-10 NOTE — Discharge Instructions (Signed)
 [facility] [place]  Discharge instructions  Lower Extremity Bypass Surgery  Please refer to the following instruction for your post-procedure care. Your surgeon or physician assistant will discuss any changes with you.  Activity  You are encouraged to walk as much as you can. You can slowly return to normal activities during the month after your surgery. Avoid strenuous activity and heavy lifting until your doctor tells you it's OK. Avoid activities such as vacuuming or swinging a golf club. Do not drive until your doctor give the OK and you are no longer taking prescription pain medications. It is also normal to have difficulty with sleep habits, eating and bowel movement after surgery. These will go away with time.  Bathing/Showering  You may shower after you go home. Do not soak in a bathtub, hot tub, or swim until the incision heals completely.  Incision Care  Clean your incision with mild soap and water. Shower every day. Pat the area dry with a clean towel. You do not need a bandage unless otherwise instructed. Do not apply any ointments or creams to your incision. If you have open wounds you will be instructed how to care for them or a visiting nurse may be arranged for you. If you have staples or sutures along your incision they will be removed at your post-op appointment. You may have skin glue on your incision. Do not peel it off. It will come off on its own in about one week. If you have a great deal of moisture in your groin, use a gauze help keep this area dry.  Diet  Resume your normal diet. There are no special food restrictions following this procedure. A low fat/ low cholesterol diet is recommended for all patients with vascular disease. In order to heal from your surgery, it is CRITICAL to get adequate nutrition. Your body requires vitamins, minerals, and protein. Vegetables are the best source of vitamins and minerals. Vegetables also provide the  perfect balance of protein. Processed food has little nutritional value, so try to avoid this.  Medications  Resume taking all your medications unless your doctor or nurse practitioner tells you not to. If your incision is causing pain, you may take over-the-counter pain relievers such as acetaminophen (Tylenol). If you were prescribed a stronger pain medication, please aware these medication can cause nausea and constipation. Prevent nausea by taking the medication with a snack or meal. Avoid constipation by drinking plenty of fluids and eating foods with high amount of fiber, such as fruits, vegetables, and grains. Take Colase 100 mg (an over-the-counter stool softener) twice a day as needed for constipation. Do not take Tylenol if you are taking prescription pain medications.  Follow Up  Our office will schedule a follow up appointment 2-3 weeks following discharge.  Please call us immediately for any of the following conditions  Severe or worsening pain in your legs or feet while at rest or while walking Increase pain, redness, warmth, or drainage (pus) from your incision site(s) Fever of 101 degree or higher The swelling in your leg with the bypass suddenly worsens and becomes more painful than when you were in the hospital If you have been instructed to feel your graft pulse then you should do so every day. If you can no longer feel this pulse, call the office immediately. Not all patients are given this instruction.  Leg swelling is common after leg bypass surgery.  The swelling should improve over a few months   following surgery. To improve the swelling, you may elevate your legs above the level of your heart while you are sitting or resting. Your surgeon or physician assistant may ask you to apply an ACE wrap or wear compression (TED) stockings to help to reduce swelling.  Reduce your risk of vascular disease  Stop smoking. If you would like help call [facility] at 1-800-QUIT-NOW  (1-800-784-8669) or [place] at 336-586-4000.  Manage your cholesterol Maintain a desired weight Control your diabetes weight Control your diabetes Keep your blood pressure down  If you have any questions, please call the office at [phone]   

## 2017-08-10 NOTE — Progress Notes (Signed)
   VASCULAR SURGERY ASSESSMENT & PLAN:   1 Day Post-Op s/p: right fem-bk pop bypass with PTFE  Doing well. Home today on ASA and a statin.   He tells me that he and his wife have quit smoking. She has cleaned out the house.    SUBJECTIVE:   Wants to go home. Right foot feels great.   PHYSICAL EXAM:   Vitals:   08/09/17 1600 08/09/17 1922 08/09/17 2348 08/10/17 0356  BP:  (!) 154/86 (!) 142/88 134/80  Pulse: (!) 102 98 79 87  Resp: (!) 23 15 16  (!) 26  Temp:  98.2 F (36.8 C) 98.1 F (36.7 C) 98.5 F (36.9 C)  TempSrc:  Oral Oral Oral  SpO2: 94% 100% 100% 97%   Right foot hyperemic Incisions look fine.   LABS:   Lab Results  Component Value Date   WBC 8.2 08/09/2017   HGB 11.4 (L) 08/09/2017   HCT 33.2 (L) 08/09/2017   MCV 95.1 08/09/2017   PLT 236 08/09/2017   Lab Results  Component Value Date   CREATININE 0.81 08/09/2017   Lab Results  Component Value Date   INR 0.91 08/02/2017   CBG (last 3)  Recent Labs    08/10/17 0313 08/10/17 0354 08/10/17 0550  GLUCAP 58* 153* 224*    PROBLEM LIST:    Active Problems:   PAD (peripheral artery disease) (HCC)   CURRENT MEDS:   . aspirin EC  81 mg Oral Daily  . docusate sodium  100 mg Oral Daily  . heparin  5,000 Units Subcutaneous Q8H  . imipramine  25 mg Oral QHS  . insulin aspart  0-9 Units Subcutaneous TID WC  . insulin glargine  12 Units Subcutaneous QHS  . lisinopril  7.5 mg Oral Daily  . pantoprazole  40 mg Oral Daily  . simvastatin  20 mg Oral q1800    Waverly FerrariChristopher Antron Seth Beeper: [phone] Office: [phone] 08/10/2017

## 2017-08-10 NOTE — Discharge Summary (Signed)
Physician Discharge Summary   Patient ID: Jay BlazerJason B Barber [id number] 37 y.o. Oct 01, 1980  Admit date: 08/09/2017  Discharge date and time: 08/10/17   Admitting Physician: Chuck Hinthristopher S Dickson, MD   Discharge Physician: same  Admission Diagnoses: claudication  Discharge Diagnoses: same  Admission Condition: fair  Discharged Condition: fair  Indication for Admission: Critical limb ischemia RLE  Hospital Course: Jay Barber is a 37 year old male who came in as an outpatient for right femoral to below the knee popliteal artery bypass with PTFE due to critical limb ischemia and nonhealing wounds of right foot.  This was performed by Dr. Edilia Boickson on 08/09/2017.  Patient was admitted to the hospital overnight.  POD #1 morning drawn labs do not demonstrate any significant drop in hemoglobin or electively abnormalities.  Jay Barber is tolerating a home diet, ambulating without difficulty, and feeling fit for discharge home today.  Jay Barber will be prescribed 2 to 3 days of narcotic pain medication for continued postoperative pain control.  Jay Barber will follow-up with Dr. Edilia Boickson in 2 weeks.  Discharge instructions were reviewed with the patient and Jay Barber voices understanding.  Jay Barber will be discharged this morning to home in stable condition.  Consults: None  Treatments: surgery by Dr. Edilia Boickson 08/09/17: R femoral to BK popliteal artery bypass with intraoperative arteriogram  Discharge Exam: see progress note 08/10/17 Vitals:   08/10/17 0356 08/10/17 0751  BP: 134/80 (!) 141/91  Pulse: 87 81  Resp: (!) 26 15  Temp: 98.5 F (36.9 C) 98 F (36.7 C)  SpO2: 97%     Disposition: Discharge disposition: 01-Home or Self Care       - [facility] ---  Post-op:  Wound infection: No  Graft infection: No  Transfusion: No  If yes,  units given New Arrhythmia: No Ipsilateral amputation: [x ] no, [ ]  Minor, [ ]  BKA, [ ]  AKA D/C Ambulatory Status: Ambulatory  Complications: MI: [x ] No, [ ]  Troponin only,  [ ]  EKG or Clinical CHF: No Resp failure: [x ] none, [ ]  Pneumonia, [ ]  Ventilator Chg in renal function: [x ] none, [ ]  Inc. Cr > 0.5, [ ]  Temp. Dialysis, [ ]  Permanent dialysis Stroke: [x ] None, [ ]  Minor, [ ]  Major Return to OR: No  Reason for return to OR: [ ]  Bleeding, [ ]  Infection, [ ]  Thrombosis, [ ]  Revision  Discharge medications: Statin use:  Yes ASA use:  Yes Plavix use:  No  for medical reason not indicated Beta blocker use: No  for medical reason not indicated Coumadin use: No  for medical reason not indicated    Patient Instructions:  Allergies as of 08/10/2017   No Known Allergies     Medication List    TAKE these medications   ALIVE MENS ENERGY PO Take 1 tablet by mouth daily.   ALPRAZolam 1 MG tablet Commonly known as:  XANAX Take 1 mg by mouth 2 (two) times daily as needed for anxiety.   aspirin 81 MG tablet Take 81 mg by mouth daily.   imipramine 25 MG tablet Commonly known as:  TOFRANIL Take 25 mg by mouth at bedtime.   insulin aspart 100 UNIT/ML injection Commonly known as:  novoLOG Inject 2-4 Units into the skin 3 (three) times daily with meals. Per sliding scale   insulin glargine 100 UNIT/ML injection Commonly known as:  LANTUS Inject 12 Units into the skin at bedtime.   lisinopril 5 MG tablet Commonly known as:  PRINIVIL,ZESTRIL Take  7.5 mg by mouth daily.   mupirocin ointment 2 % Commonly known as:  BACTROBAN Apply 1 application topically 2 (two) times daily.   omeprazole 20 MG tablet Commonly known as:  PRILOSEC OTC Take 20 mg by mouth daily as needed (for acid reflux).   oxyCODONE-acetaminophen 5-325 MG tablet Commonly known as:  PERCOCET/ROXICET Take 1 tablet by mouth every 6 (six) hours as needed for moderate pain.   simvastatin 20 MG tablet Commonly known as:  ZOCOR Take 1 tablet (20 mg total) by mouth daily at 6 PM.      Activity: activity as tolerated Diet: regular diet Wound Care: keep wounds clean and  dry  Follow-up with Dr. Edilia Bo in 2 weeks.  SignedEmilie Rutter 08/10/2017 8:36 AM

## 2017-08-10 NOTE — Evaluation (Signed)
Physical Therapy Evaluation Patient Details Name: Jay Barber MRN: [id number] DOB: 01-May-1980 Today's Date: 08/10/2017   History of Present Illness  Jay Barber is a 37 yo white male s/p right femoral to below-knee popliteal artery bypass on 6/11. 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 Pt reports current dorsal wound on Rt foot, that is not infected, and has no weight bearing restrictions at this time.   Clinical Impression  Pt admitted with above diagnosis. Pt currently with functional limitations due to the deficits listed below (see "PT Problem List"). Upon entry, pt in bathroom, no family/caregiver present. The pt is awake and agreeable to participate.  Pt performs transfers, AMB, and stairs with modified to ful independence, occasionally needing additional time to perform for antalgia. Pt demonstrates no LOB. Pt educated on importance of compliance with smoking cessation, compliance with anticoagulation recommendations at DC, and gradual progression of limb ROM and activity after DC. All education completed, and time is given to address all questions/concerns. No additional skilled PT services needed at this time, PT signing off. PT recommends daily ambulation ad lib or with nursing staff as needed to prevent deconditioning.      Follow Up Recommendations Follow surgeon's recommendation for DC plan and follow-up therapies    Equipment Recommendations  None recommended by PT    Recommendations for Other Services       Precautions / Restrictions Precautions Precautions: Fall Restrictions Weight Bearing Restrictions: No      Mobility  Bed Mobility Overal bed mobility: Independent             General bed mobility comments: received in bathroom   Transfers Overall transfer level: Independent               General transfer comment: Increased time  Ambulation/Gait Ambulation/Gait assistance: Modified independent  (Device/Increase time) Ambulation Distance (Feet): 200 Feet Assistive device: None Gait Pattern/deviations: Antalgic;Decreased dorsiflexion - right Gait velocity: 0.8535m/s.    General Gait Details: utilizing an equinus gait on RLE for pain control, avoiding terminal knee/hip extension to zero degrees, and weightbearing on forefoot avoiding dorsiflexion in weightbearing.   Stairs Stairs: Yes Stairs assistance: Modified independent (Device/Increase time) Stair Management: One rail Right;Alternating pattern;Step to pattern Number of Stairs: 4 General stair comments: step-to ascending, alternating pattern descending.   Wheelchair Mobility    Modified Rankin (Stroke Patients Only)       Balance Overall balance assessment: Independent;No apparent balance deficits (not formally assessed)                                           Pertinent Vitals/Pain Pain Assessment: 0-10 Pain Score: 5  Pain Location: RLE Pain Descriptors / Indicators: Constant;Discomfort Pain Intervention(s): Limited activity within patient's tolerance;Monitored during session(very sporatic, intermittent increases in stinging sensation, otherwise well controlle.d)    Home Living Family/patient expects to be discharged to:: Private residence Living Arrangements: Spouse/significant other;Children Available Help at Discharge: Family;Available 24 hours/day Type of Home: House Home Access: Stairs to enter   [employer] CorporationEntrance Stairs-Number of Steps: 3 Home Layout: One level Home Equipment: Bedside commode;Walker - 2 wheels;Cane - single point;Walker - 4 wheels      Prior Function Level of Independence: Independent         Comments: Has assistance for IADL PRN from daughter, MIL, and wife.      Hand Dominance  Dominant Hand: Left    Extremity/Trunk Assessment   Upper Extremity Assessment Upper Extremity Assessment: Defer to OT evaluation    Lower Extremity Assessment Lower Extremity  Assessment: Overall WFL for tasks assessed    Cervical / Trunk Assessment Cervical / Trunk Assessment: Normal  Communication   Communication: No difficulties  Cognition Arousal/Alertness: Awake/alert Behavior During Therapy: WFL for tasks assessed/performed Overall Cognitive Status: Within Functional Limits for tasks assessed                                        General Comments      Exercises     Assessment/Plan    PT Assessment Patent does not need any further PT services  PT Problem List Decreased mobility;Decreased activity tolerance;Decreased range of motion;Decreased strength       PT Treatment Interventions Therapeutic activities;Patient/family education    PT Goals (Current goals can be found in the Care Plan section)  Acute Rehab PT Goals Patient Stated Goal: Go home PT Goal Formulation: All assessment and education complete, DC therapy    Frequency     Barriers to discharge        Co-evaluation               AM-PAC PT "6 Clicks" Daily Activity  Outcome Measure Difficulty turning over in bed (including adjusting bedclothes, sheets and blankets)?: None Difficulty moving from lying on back to sitting on the side of the bed? : None Difficulty sitting down on and standing up from a chair with arms (e.g., wheelchair, bedside commode, etc,.)?: A Little Help needed moving to and from a bed to chair (including a wheelchair)?: A Little Help needed walking in hospital room?: A Little Help needed climbing 3-5 steps with a railing? : A Little 6 Click Score: 20    End of Session   Activity Tolerance: Patient tolerated treatment well;Patient limited by pain Patient left: in chair Nurse Communication: Mobility status PT Visit Diagnosis: Other abnormalities of gait and mobility (R26.89);Difficulty in walking, not elsewhere classified (R26.2)    Time: 1610-9604 PT Time Calculation (min) (ACUTE ONLY): 9 min   Charges:   PT Evaluation $PT  Eval Moderate Complexity: 1 Mod     PT G Codes:        10:01 AM, 08-13-2017 Rosamaria Lints, PT, DPT Physical Therapist - [place] [phone] (Pager)  [phone] (Office)      Buccola,Allan C 13-Aug-2017, 9:58 AM

## 2017-08-10 NOTE — Telephone Encounter (Signed)
sch appt spk to pt 08/31/17 230pm p/o MD

## 2017-08-10 NOTE — Evaluation (Signed)
Occupational Therapy Evaluation Patient Details Name: Jay Barber MRN: [id number] DOB: 1980-12-31 Today's Date: 08/10/2017    History of Present Illness 37 yo s/p right femoral to below-knee popliteal artery bypass on 6/11. 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   Clinical Impression   PTA, pt was living with his wife and two children and was independent. Currently, pt performing ADLs and functional mobility at Mod I level with increase time. Pt presenting near baseline level. Provided education on LB ADLs, toilet transfer, and tub transfer; pt demonstrated and verbalized understanding. Answered all pt questions. Recommend dc home once medically stable per physician. All acute OT needs met and will sign off. Thank you.     Follow Up Recommendations  No OT follow up;Supervision - Intermittent    Equipment Recommendations  None recommended by OT    Recommendations for Other Services       Precautions / Restrictions Precautions Precautions: None Restrictions Weight Bearing Restrictions: No      Mobility Bed Mobility Overal bed mobility: Independent                Transfers Overall transfer level: Modified independent               General transfer comment: Increased time    Balance Overall balance assessment: No apparent balance deficits (not formally assessed)                                         ADL either performed or assessed with clinical judgement   ADL Overall ADL's : Modified independent                                       General ADL Comments: Pt performing ADLs and functional mobility at a Mod I level with increased time. Providing pt with education on compensatory techniques for LB dressing, toileting, and tub transfers. Pt donning pants at Mod I demonstrating understanding of education. Pt verbalizing understanding of funcitonal transfers.     Vision         Perception     Praxis      Pertinent Vitals/Pain Pain Assessment: 0-10 Pain Score: 2  Pain Location: RLE Pain Descriptors / Indicators: Constant;Discomfort Pain Intervention(s): Monitored during session;Repositioned     Hand Dominance Left   Extremity/Trunk Assessment Upper Extremity Assessment Upper Extremity Assessment: Overall WFL for tasks assessed   Lower Extremity Assessment Lower Extremity Assessment: Defer to PT evaluation   Cervical / Trunk Assessment Cervical / Trunk Assessment: Normal   Communication Communication Communication: No difficulties   Cognition Arousal/Alertness: Awake/alert Behavior During Therapy: WFL for tasks assessed/performed Overall Cognitive Status: Within Functional Limits for tasks assessed                                     General Comments       Exercises     Shoulder Instructions      Home Living Family/patient expects to be discharged to:: Private residence Living Arrangements: Spouse/significant other;Children Available Help at Discharge: Family;Available 24 hours/day Type of Home: House Home Access: Stairs to enter [employer] of Steps: 3   Home Layout: One level  Bathroom Shower/Tub: Tub/shower unit;Walk-in shower   Bathroom Toilet: Standard     Home Equipment: Bedside commode;Walker - 2 wheels;Cane - single point          Prior Functioning/Environment Level of Independence: Independent                 OT Problem List: Decreased activity tolerance;Pain      OT Treatment/Interventions:      OT Goals(Current goals can be found in the care plan section) Acute Rehab OT Goals Patient Stated Goal: Go home OT Goal Formulation: All assessment and education complete, DC therapy  OT Frequency:     Barriers to D/C:            Co-evaluation              AM-PAC PT "6 Clicks" Daily Activity     Outcome Measure Help from another person eating meals?: None Help  from another person taking care of personal grooming?: None Help from another person toileting, which includes using toliet, bedpan, or urinal?: None Help from another person bathing (including washing, rinsing, drying)?: None Help from another person to put on and taking off regular upper body clothing?: None Help from another person to put on and taking off regular lower body clothing?: None 6 Click Score: 24   End of Session Nurse Communication: Mobility status  Activity Tolerance: Patient tolerated treatment well Patient left: in chair;with bed alarm set(eating breakfast)  OT Visit Diagnosis: Other abnormalities of gait and mobility (R26.89);Muscle weakness (generalized) (M62.81);Pain Pain - Right/Left: Right Pain - part of body: Leg                Time: 1103-1594 OT Time Calculation (min): 10 min Charges:  OT General Charges $OT Visit: 1 Visit OT Evaluation $OT Eval Low Complexity: 1 Low G-Codes:     Alilah Mcmeans MSOT, OTR/L Acute Rehab Pager: [phone] Office: [place] 08/10/2017, 9:11 AM

## 2017-08-10 NOTE — Telephone Encounter (Signed)
-----   Message from Westley HummerLisa Tucker, RN sent at 08/10/2017 10:06 AM EDT ----- Regarding: Appointment   ----- Message ----- From: Forestine NaEveland, Matthew, PA-C Sent: 08/10/2017   7:02 AM To: [facility] Charge Pool  Can you schedule an appt for this pt with Dr. Edilia Boickson.  PO R fem-pop w PTFE. Thanks, [employer] ChemicalMatt

## 2017-08-11 ENCOUNTER — Telehealth: Payer: Self-pay | Admitting: Vascular Surgery

## 2017-08-11 NOTE — Telephone Encounter (Signed)
resch appt lvm 08/24/17 330pm p/o MD

## 2017-08-19 ENCOUNTER — Telehealth: Payer: Self-pay | Admitting: Vascular Surgery

## 2017-08-19 NOTE — Telephone Encounter (Signed)
Pt called complaining of swelling in foot and ankle.  Says he had a procedure (PTFE) on 08/09/17.  He does not report pain.  He lays in a recliner to elevate his feet 3 times a day.  Patient advised to lay flat on a bad and elevate feet with several pillows and drink plenty of water.  He will contact the office or go to the ED if he starts to feel worse.   Ernst SpellAshleigh E., LPN

## 2017-08-24 ENCOUNTER — Other Ambulatory Visit: Payer: Self-pay

## 2017-08-24 ENCOUNTER — Encounter: Payer: Self-pay | Admitting: Vascular Surgery

## 2017-08-24 ENCOUNTER — Ambulatory Visit (INDEPENDENT_AMBULATORY_CARE_PROVIDER_SITE_OTHER): Payer: Self-pay | Admitting: Vascular Surgery

## 2017-08-24 VITALS — BP 145/98 | HR 88 | Temp 98.3°F | Resp 16 | Ht 71.0 in | Wt 136.0 lb

## 2017-08-24 DIAGNOSIS — Z48812 Encounter for surgical aftercare following surgery on the circulatory system: Secondary | ICD-10-CM

## 2017-08-24 NOTE — Progress Notes (Signed)
Patient name: Jay BlazerJason B Barber MRN: [id number] DOB: 13-Jan-1981 Sex: male  REASON FOR VISIT:   Follow-up after right femoropopliteal bypass.  HPI:   Jay Barber is a pleasant 37 y.o. male not been following with peripheral vascular disease.  He had a wound on the dorsal aspect of his right foot which measured 2 cm in diameter.  This went down to the tendon.  Based on his arteriogram he appeared to be a candidate for a femoral to below-knee popliteal artery bypass.  However his vein was small.  On 08/09/2017, he underwent a right femoral to below-knee popliteal artery bypass with a 6 mm PTFE graft he did well postoperatively and was discharged on postoperative day #1.  He comes in for his first outpatient visit.  He has no specific complaints.  The wound on his foot is improving.  Current Outpatient Medications  Medication Sig Dispense Refill  . ALPRAZolam (XANAX) 1 MG tablet Take 1 mg by mouth 2 (two) times daily as needed for anxiety.     Marland Kitchen. aspirin 81 MG tablet Take 81 mg by mouth daily.    Marland Kitchen. imipramine (TOFRANIL) 25 MG tablet Take 25 mg by mouth at bedtime.     . insulin aspart (NOVOLOG) 100 UNIT/ML injection Inject 2-4 Units into the skin 3 (three) times daily with meals. Per sliding scale    . insulin glargine (LANTUS) 100 UNIT/ML injection Inject 12 Units into the skin at bedtime.     Marland Kitchen. lisinopril (PRINIVIL,ZESTRIL) 5 MG tablet Take 7.5 mg by mouth daily.     . Multiple Vitamins-Minerals (ALIVE MENS ENERGY PO) Take 1 tablet by mouth daily.    . mupirocin ointment (BACTROBAN) 2 % Apply 1 application topically 2 (two) times daily.  2  . omeprazole (PRILOSEC OTC) 20 MG tablet Take 20 mg by mouth daily as needed (for acid reflux).    . simvastatin (ZOCOR) 20 MG tablet Take 1 tablet (20 mg total) by mouth daily at 6 PM. 30 tablet 5  . oxyCODONE-acetaminophen (PERCOCET/ROXICET) 5-325 MG tablet Take 1 tablet by mouth every 6 (six) hours as needed for moderate pain. (Patient not taking:  Reported on 08/24/2017) 16 tablet 0   No current facility-administered medications for this visit.     REVIEW OF SYSTEMS:  [X]  denotes positive finding, [ ]  denotes negative finding Cardiac  Comments:  Chest pain or chest pressure:    Shortness of breath upon exertion:    Short of breath when lying flat:    Irregular heart rhythm:    Constitutional    Fever or chills:     PHYSICAL EXAM:   Vitals:   08/24/17 1635 08/24/17 1638  BP: (!) 150/96 (!) 145/98  Pulse: 85 88  Resp: 16   Temp: 98.3 F (36.8 C)   TempSrc: Oral   SpO2: 100%   Weight: 136 lb (61.7 kg)   Height: 5\' 11"  (1.803 m)     GENERAL: The patient is a well-nourished male, in no acute distress. The vital signs are documented above. CARDIOVASCULAR: There is a regular rate and rhythm. PULMONARY: There is good air exchange bilaterally without wheezing or rales. His incisions are healing nicely. He has a palpable right posterior tibial pulse. The wound on the dorsum of his foot measures approximately a centimeter in diameter and thus is decreased in size. He has mild right lower extremity swelling.  DATA:   No new data  MEDICAL ISSUES:   STATUS POST RIGHT FEMORAL TO BELOW-KNEE  POPLITEAL ARTERY BYPASS: His bypass graft is patent with a palpable posterior tibial pulse.  The wound on his right foot is healing.  I have ordered ABIs and a graft duplex in 3 months and I will see him back at that time.  He knows to call sooner if he has problems.  He will continue to elevate his leg for some mild right lower extremity swelling.  Waverly Ferrari [facility] [phone]

## 2017-08-26 ENCOUNTER — Other Ambulatory Visit: Payer: Self-pay

## 2017-08-26 DIAGNOSIS — I739 Peripheral vascular disease, unspecified: Secondary | ICD-10-CM

## 2017-08-31 ENCOUNTER — Encounter: Payer: Medicaid Other | Admitting: Vascular Surgery

## 2017-09-21 ENCOUNTER — Encounter: Payer: Self-pay | Admitting: Physician Assistant

## 2017-09-21 ENCOUNTER — Ambulatory Visit (INDEPENDENT_AMBULATORY_CARE_PROVIDER_SITE_OTHER): Payer: Self-pay | Admitting: Physician Assistant

## 2017-09-21 VITALS — BP 138/85 | HR 93 | Temp 99.0°F | Resp 18 | Ht 71.0 in | Wt 130.0 lb

## 2017-09-21 DIAGNOSIS — F17200 Nicotine dependence, unspecified, uncomplicated: Secondary | ICD-10-CM

## 2017-09-21 DIAGNOSIS — I739 Peripheral vascular disease, unspecified: Secondary | ICD-10-CM

## 2017-09-21 DIAGNOSIS — I7025 Atherosclerosis of native arteries of other extremities with ulceration: Secondary | ICD-10-CM

## 2017-09-21 MED ORDER — CEPHALEXIN 500 MG PO CAPS: 500.0000 mg | ORAL_CAPSULE | Freq: Two times a day (BID) | ORAL | 0 refills | Status: DC

## 2017-09-21 NOTE — Progress Notes (Signed)
Established Previous Bypass   History of Present Illness   Jay Barber is a 37 y.o. (1980/08/30) male who presents with chief complaint of pain and redness R foot x1 week  Prior procedures include: 1.  R femoral to BK popliteal bypass with PTFE by Dr. Edilia Bo 08/09/17   Patient believes this is related to a pressure sore related to the way his foot was laying on his couch.  He states R GT callus is stable and R dorsal foot wound is nearly completely healed.  He denies claudication or rest pain.  He has been back at work and up on his feet without restrictions.  He is taking aspirin daily.  He continues to smoke tobacco daily.  PMH also significant for IDDM however patient states his sugars have been controlled.  [name] PMH, PSH, SH, and FamHx were reviewed and are unchanged from prior visit.  Current Outpatient Medications  Medication Sig Dispense Refill  . ALPRAZolam (XANAX) 1 MG tablet Take 1 mg by mouth 2 (two) times daily as needed for anxiety.     Marland Kitchen aspirin 81 MG tablet Take 81 mg by mouth daily.    Marland Kitchen imipramine (TOFRANIL) 25 MG tablet Take 25 mg by mouth at bedtime.     . insulin aspart (NOVOLOG) 100 UNIT/ML injection Inject 2-4 Units into the skin 3 (three) times daily with meals. Per sliding scale    . insulin glargine (LANTUS) 100 UNIT/ML injection Inject 12 Units into the skin at bedtime.     Marland Kitchen lisinopril (PRINIVIL,ZESTRIL) 5 MG tablet Take 7.5 mg by mouth daily.     . Multiple Vitamins-Minerals (ALIVE MENS ENERGY PO) Take 1 tablet by mouth daily.    . mupirocin ointment (BACTROBAN) 2 % Apply 1 application topically 2 (two) times daily.  2  . omeprazole (PRILOSEC OTC) 20 MG tablet Take 20 mg by mouth daily as needed (for acid reflux).    Marland Kitchen oxyCODONE-acetaminophen (PERCOCET/ROXICET) 5-325 MG tablet Take 1 tablet by mouth every 6 (six) hours as needed for moderate pain. 16 tablet 0  . simvastatin (ZOCOR) 20 MG tablet Take 1 tablet (20 mg total) by mouth daily at 6 PM.  30 tablet 5   No current facility-administered medications for this visit.     On ROS today: 10 system ROS negative unless otherwise noted in HPI   Physical Examination   Vitals:   09/21/17 1612  BP: 138/85  Pulse: 93  Resp: 18  Temp: 99 F (37.2 C)  TempSrc: Oral  SpO2: 100%  Weight: 130 lb (59 kg)  Height: 5\' 11"  (1.803 m)   Body mass index is 18.13 kg/m.  General Alert, O x 3, WD, NAD  Pulmonary Sym exp, good B air movt  Cardiac RRR, Nl S1, S2,   Vascular R groin incision healed; R popliteal incision healed without fluctuance; R dorsal foot wound nearly healed, very superficial, diameter <19mm; R GT amp site with callus; R plantar foot base of 5 metatarsal with small shallow 5mm x 5mm pressure ulceration without drainage; generalized erythema distal foot originating from lateral foot; palpable R PTA; DP and peroneal brisk by doppler; foot warm to touch  Gastro- intestinal soft, non-distended, non-tender to palpation,   Musculo- skeletal M/S 5/5 throughout  ,   , No edema present, No visible varicosities , No Lipodermatosclerosis present  Neurologic Pain and light touch intact in extremities , Motor exam as listed above      Medical Decision Making  Jay Barber is a 37 y.o. male who presents with redness and pain with walking R foot 6 weeks s/p R femoral to BK popliteal bypass with PTFE  .  Early cellulitis picture distal foot secondary to pressure sore plantar surface of 5th metatarsal base  Patent bypass and RLE well perfused with palpable R PTA and DP and peroneal by doppler  500mg  keflex BID for 14 days prescribed  Recommended diabetic shoes to limit pressure points  Encouraged smoking cessation  Patient states he will follow up with Podiatrist Dr. Yetta BarreJones if pressure sore fails to improve  Follow up as scheduled 11/2017 for graft surveillance and ABIs   Emilie RutterMatthew Karlene Southard PA-C [facility] [place] Office: [phone]

## 2017-09-26 ENCOUNTER — Ambulatory Visit: Payer: Medicaid Other | Admitting: Family

## 2017-09-26 ENCOUNTER — Other Ambulatory Visit: Payer: Self-pay | Admitting: Family

## 2017-09-26 VITALS — BP 133/85 | HR 94 | Temp 98.0°F | Resp 20 | Ht 71.0 in | Wt 135.5 lb

## 2017-09-26 DIAGNOSIS — I96 Gangrene, not elsewhere classified: Secondary | ICD-10-CM

## 2017-09-26 DIAGNOSIS — I998 Other disorder of circulatory system: Secondary | ICD-10-CM

## 2017-09-26 DIAGNOSIS — I779 Disorder of arteries and arterioles, unspecified: Secondary | ICD-10-CM

## 2017-09-26 DIAGNOSIS — E1065 Type 1 diabetes mellitus with hyperglycemia: Secondary | ICD-10-CM

## 2017-09-26 DIAGNOSIS — F17200 Nicotine dependence, unspecified, uncomplicated: Secondary | ICD-10-CM

## 2017-09-26 NOTE — Patient Instructions (Signed)
Steps to Quit Smoking Smoking tobacco can be bad for your health. It can also affect almost every organ in your body. Smoking puts you and people around you at risk for many serious long-lasting (chronic) diseases. Quitting smoking is hard, but it is one of the best things that you can do for your health. It is never too late to quit. What are the benefits of quitting smoking? When you quit smoking, you lower your risk for getting serious diseases and conditions. They can include:  Lung cancer or lung disease.  Heart disease.  Stroke.  Heart attack.  Not being able to have children (infertility).  Weak bones (osteoporosis) and broken bones (fractures).  If you have coughing, wheezing, and shortness of breath, those symptoms may get better when you quit. You may also get sick less often. If you are pregnant, quitting smoking can help to lower your chances of having a baby of low birth weight. What can I do to help me quit smoking? Talk with your doctor about what can help you quit smoking. Some things you can do (strategies) include:  Quitting smoking totally, instead of slowly cutting back how much you smoke over a period of time.  Going to in-person counseling. You are more likely to quit if you go to many counseling sessions.  Using resources and support systems, such as: ? Online chats with a counselor. ? Phone quitlines. ? Printed self-help materials. ? Support groups or group counseling. ? Text messaging programs. ? Mobile phone apps or applications.  Taking medicines. Some of these medicines may have nicotine in them. If you are pregnant or breastfeeding, do not take any medicines to quit smoking unless your doctor says it is okay. Talk with your doctor about counseling or other things that can help you.  Talk with your doctor about using more than one strategy at the same time, such as taking medicines while you are also going to in-person counseling. This can help make  quitting easier. What things can I do to make it easier to quit? Quitting smoking might feel very hard at first, but there is a lot that you can do to make it easier. Take these steps:  Talk to your family and friends. Ask them to support and encourage you.  Call phone quitlines, reach out to support groups, or work with a counselor.  Ask people who smoke to not smoke around you.  Avoid places that make you want (trigger) to smoke, such as: ? Bars. ? Parties. ? Smoke-break areas at work.  Spend time with people who do not smoke.  Lower the stress in your life. Stress can make you want to smoke. Try these things to help your stress: ? Getting regular exercise. ? Deep-breathing exercises. ? Yoga. ? Meditating. ? Doing a body scan. To do this, close your eyes, focus on one area of your body at a time from head to toe, and notice which parts of your body are tense. Try to relax the muscles in those areas.  Download or buy apps on your mobile phone or tablet that can help you stick to your quit plan. There are many free apps, such as QuitGuide from the CDC (Centers for Disease Control and Prevention). You can find more support from smokefree.gov and other websites.  This information is not intended to replace advice given to you by your health care provider. Make sure you discuss any questions you have with your health care provider. Document Released: 12/12/2008 Document   Revised: 10/14/2015 Document Reviewed: 07/02/2014 Elsevier Interactive Patient Education  2018 Elsevier Inc.     Peripheral Vascular Disease Peripheral vascular disease (PVD) is a disease of the blood vessels that are not part of your heart and brain. A simple term for PVD is poor circulation. In most cases, PVD narrows the blood vessels that carry blood from your heart to the rest of your body. This can result in a decreased supply of blood to your arms, legs, and internal organs, like your stomach or kidneys.  However, it most often affects a person's lower legs and feet. There are two types of PVD.  Organic PVD. This is the more common type. It is caused by damage to the structure of blood vessels.  Functional PVD. This is caused by conditions that make blood vessels contract and tighten (spasm).  Without treatment, PVD tends to get worse over time. PVD can also lead to acute ischemic limb. This is when an arm or limb suddenly has trouble getting enough blood. This is a medical emergency. Follow these instructions at home:  Take medicines only as told by your doctor.  Do not use any tobacco products, including cigarettes, chewing tobacco, or electronic cigarettes. If you need help quitting, ask your doctor.  Lose weight if you are overweight, and maintain a healthy weight as told by your doctor.  Eat a diet that is low in fat and cholesterol. If you need help, ask your doctor.  Exercise regularly. Ask your doctor for some good activities for you.  Take good care of your feet. ? Wear comfortable shoes that fit well. ? Check your feet often for any cuts or sores. Contact a doctor if:  You have cramps in your legs while walking.  You have leg pain when you are at rest.  You have coldness in a leg or foot.  Your skin changes.  You are unable to get or have an erection (erectile dysfunction).  You have cuts or sores on your feet that are not healing. Get help right away if:  Your arm or leg turns cold and blue.  Your arms or legs become red, warm, swollen, painful, or numb.  You have chest pain or trouble breathing.  You suddenly have weakness in your face, arm, or leg.  You become very confused or you cannot speak.  You suddenly have a very bad headache.  You suddenly cannot see. This information is not intended to replace advice given to you by your health care provider. Make sure you discuss any questions you have with your health care provider. Document Released:  05/12/2009 Document Revised: 07/24/2015 Document Reviewed: 07/26/2013 Elsevier Interactive Patient Education  2017 Elsevier Inc.  

## 2017-09-26 NOTE — H&P (View-Only) (Signed)
Postoperative Visit   History of Present Illness  Jay Barber is a 37 y.o. year old male who is s/p R femoral to BK popliteal bypass with PTFE by Dr. Edilia Bo on 08/09/17 for  right lower limb critical ischemia, infrainguinal arterial occlusive disease and 2 nonhealing wounds on his right foot.  He is also s/p amputation of right great toe by Dr. Lajoyce Corners on 05-11-13.    He was seen 5 days ago by M. Eveland PA-C for c/o pain and redness R foot x1 week.  Patient believed this was related to a pressure sore related to the way his foot was laying on his couch.  He stated R GT callus was stable and R dorsal foot wound was nearly completely healed.  He denied claudication or rest pain.  He had been back at work and up on his feet without restrictions.    At his visit on 09-21-17:  Early cellulitis picture distal foot secondary to pressure sore plantar surface of 5th metatarsal base  Patent bypass and RLE well perfused with palpable R PTA and DP and peroneal by doppler  500mg  keflex BID for 14 days prescribed  Recommended diabetic shoes to limit pressure points  Encouraged smoking cessation  Patient states he will follow up with Podiatrist Dr. Yetta Barre if pressure sore fails to improve Follow up as scheduled 11/2017 for graft surveillance and ABIs.   He returns today with c/o right foot wounds drainage and right foot redness not improving.   States pain in the lateral aspect of his right foot seems to be less since draining occurred from the open wounds on his right foot 3 days ago.  He denies fever or chills.   He has an appointment tomorrow with a podiatrist in [place].  He is taking the prescribed Keflex since is visit on 09-21-17.    He is taking aspirin daily.    He continues to smoke tobacco daily.   He is a type 1 diabetic, diagnosed with DM in his 20's; last A1C result on file was 8.2 on 08-02-17, uncontrolled.   He was scheduled it appears to have ABI's and lower extremity  bypass graft duplex on 08-26-17 which was not done.  His next scheduled follow up is on10-2-19 with Dr. Edilia Bo and the aforementioned tests.   0.81 was his serum creatinine on 08-09-17.  The patient is able to complete his activities of daily living.    For VQI Use Only  PRE-ADM LIVING: Home, accompanied by wife  AMB STATUS: Ambulatory    Past Medical History:  Diagnosis Date  . Depression   . Diabetes mellitus without complication (HCC)    takes Lantus and Novolog  . Drainage from wound    right big toe  . Family history of adverse reaction to anesthesia    mother had PONV  . Full dentures   . GERD (gastroesophageal reflux disease)   . History of bronchitis    only had once in his life-3+yrs ago  . Hypertension   . Nocturia   . Panic attacks    hx of but no meds required  . Peripheral vascular disease (HCC)   . Wears contact lenses     Past Surgical History:  Procedure Laterality Date  . ABDOMINAL AORTAGRAM N/A 11/06/2012   Procedure: ABDOMINAL Ronny Flurry;  Surgeon: Chuck Hint, MD;  Location: [facility] CATH LAB;  Service: Cardiovascular;  Laterality: N/A;  . ABDOMINAL AORTOGRAM N/A 06/03/2017   Procedure: ABDOMINAL AORTOGRAM;  Surgeon:  Chuck Hintickson, Christopher S, MD;  [facility];  Service: Cardiovascular;  Laterality: N/A;  . AMPUTATION Right 05/11/2013   Procedure: AMPUTATION DIGIT;  Surgeon: Nadara MustardMarcus V Duda, MD;  Location: [facility] OR;  Service: Orthopedics;  Laterality: Right;  Right Great Toe Amputation at MTP  . ESOPHAGOGASTRODUODENOSCOPY    . FEMORAL ARTERY - POPLITEAL ARTERY BYPASS GRAFT  08/09/2017  . FEMORAL-POPLITEAL BYPASS GRAFT Right 08/09/2017   Procedure: BYPASS GRAFT FEMORAL-BELOW KNEE POPLITEAL ARTERY USING 6MM X 80CM GORE PROPATEN VASCULAR GRAFT;  Surgeon: Chuck Hintickson, Christopher S, MD;  [facility];  Service: Vascular;  Laterality: Right;  . INTRAOPERATIVE ARTERIOGRAM Right 08/09/2017   Procedure: INTRA OPERATIVE ARTERIOGRAM RIGHT LOWER LEG;   Surgeon: Chuck Hintickson, Christopher S, MD;  [facility];  Service: Vascular;  Laterality: Right;  . LOWER EXTREMITY ANGIOGRAPHY Bilateral 06/03/2017   Procedure: Lower Extremity Angiography;  Surgeon: Chuck Hintickson, Christopher S, MD;  [facility];  Service: Cardiovascular;  Laterality: Bilateral;  . MOUTH SURGERY    . MULTIPLE TOOTH EXTRACTIONS      Family History  Problem Relation Age of Onset  . Heart disease Father        before age 38  . Hypertension Mother   . Depression Mother   . GER disease Mother     Social History   Socioeconomic History  . Marital status: Single    Spouse name: Not on file  . Number of children: Not on file  . Years of education: Not on file  . Highest education level: Not on file  Occupational History  . Not on file  Social Needs  . Financial resource strain: Not on file  . Food insecurity:    Worry: Not on file    Inability: Not on file  . Transportation needs:    Medical: Not on file    Non-medical: Not on file  Tobacco Use  . Smoking status: Current Every Day Smoker    Packs/day: 0.50    Years: 14.00    Pack years: 7.00    Types: Cigarettes  . Smokeless tobacco: Never Used  . Tobacco comment: 1/2 pk per day  Substance and Sexual Activity  . Alcohol use: Yes    Comment: occaionally  . Drug use: No  . Sexual activity: Yes  Lifestyle  . Physical activity:    Days per week: Not on file    Minutes per session: Not on file  . Stress: Not on file  Relationships  . Social connections:    Talks on phone: Not on file    Gets together: Not on file    Attends religious service: Not on file    Active member of club or organization: Not on file    Attends meetings of clubs or organizations: Not on file    Relationship status: Not on file  . Intimate partner violence:    Fear of current or ex partner: Not on file    Emotionally abused: Not on file    Physically abused: Not on file    Forced sexual activity: Not on file  Other  Topics Concern  . Not on file  Social History Narrative  . Not on file    No Known Allergies  Current Outpatient Medications on File Prior to Visit  Medication Sig Dispense Refill  . ALPRAZolam (XANAX) 1 MG tablet Take 1 mg by mouth 2 (two) times daily as needed for anxiety.     Marland Kitchen. aspirin 81 MG tablet Take  81 mg by mouth daily.    . cephALEXin (KEFLEX) 500 MG capsule Take 1 capsule (500 mg total) by mouth 2 (two) times daily for 14 days. 28 capsule 0  . imipramine (TOFRANIL) 25 MG tablet Take 25 mg by mouth at bedtime.     . insulin aspart (NOVOLOG) 100 UNIT/ML injection Inject 2-4 Units into the skin 3 (three) times daily with meals. Per sliding scale    . insulin glargine (LANTUS) 100 UNIT/ML injection Inject 12 Units into the skin at bedtime.     Marland Kitchen lisinopril (PRINIVIL,ZESTRIL) 20 MG tablet Take 20 mg by mouth daily.  5  . Multiple Vitamins-Minerals (ALIVE MENS ENERGY PO) Take 1 tablet by mouth daily.    . mupirocin ointment (BACTROBAN) 2 % Apply 1 application topically 2 (two) times daily.  2  . NICOTINE STEP 1 21 MG/24HR patch APPLY 1 PATCH DAILY  0  . omeprazole (PRILOSEC OTC) 20 MG tablet Take 20 mg by mouth daily as needed (for acid reflux).    Marland Kitchen oxyCODONE-acetaminophen (PERCOCET/ROXICET) 5-325 MG tablet Take 1 tablet by mouth every 6 (six) hours as needed for moderate pain. 16 tablet 0  . simvastatin (ZOCOR) 20 MG tablet Take 1 tablet (20 mg total) by mouth daily at 6 PM. 30 tablet 5   No current facility-administered medications on file prior to visit.      Physical Examination  Vitals:   09/26/17 1529  BP: 133/85  Pulse: 94  Resp: 20  Temp: 98 F (36.7 C)  TempSrc: Oral  SpO2: 100%  Weight: 135 lb 8 oz (61.5 kg)  Height: 5\' 11"  (1.803 m)   Body mass index is 18.9 kg/m.  ROS: See HPI for pertinent positives and negatives   PHYSICAL EXAMINATION: General: The patient appears his stated age.   HEENT:  No gross abnormalities Pulmonary: Respirations are  non-labored, CTAB, slightly diminished air movement in all fields.  Abdomen: Soft and non-tender with normal bowel sounds Musculoskeletal: There are no major deformities. See Extremities.  Neurologic: No focal weakness or paresthesias are detected, Skin: There are no rashes, see Extremities. Psychiatric: The patient is irritable,curt.  Cardiovascular: There is a regular rate and rhythm without significant murmur appreciated.   Extremities: No signs of ischemia in his left foot or leg.   Right groin and right lower extremity bypass graft incisions are well healed.  Wound at mid/medial aspect of dorsum of right foot has healed. Right 1st toe is surgically absent. Right 5th toe with wet and dry gangrene.  Large shallow dry gangrene area at lateral/dorsal aspect of right foot (see photos below). Large amount of sero sanguinous drainage on the dressing that was removed, no drainage noted on examination.  Right foot is slightly ruddy, 1+ non pitting edema  2+ palpable right PT pulse. Bilateral femoral pulses are 2+ palpable.    Right foot    Right foot   Right foot   Medical Decision Making  SEARCY MIYOSHI is a 37 y.o. year old male who is s/p R femoral to BK popliteal bypass with PTFE by Dr. Edilia Bo on 08/09/17 for  right lower limb critical ischemia, infrainguinal arterial occlusive disease and 2 nonhealing wounds on his right foot.  He is also s/p amputation of right great toe by Dr. Lajoyce Corners on 05-11-13.    He returns today, was here 5 days ago, with worsening wounds and redness of right foot.  He continues the prescribed Keflex; per pt, wounds of right foot started draining  3 days ago. Right PT pulse is 2+ palpable.  He continues to smoke.  His Type 1 DM is uncontrolled.     Dr. Darrick Penna spoke with pt and wife. Need right LE arterial duplex to see if bpg is open, and will check ABI's tomorrow at 8 and 9am. Pt was instructed to be NPO after MN tonight. Continue Keflex. Right  foot wounds dressed with telfa and kerlex.   Over 3 minutes was spent counseling patient re smoking cessation, and patient was given several free resources re smoking cessation.  I discussed in depth with the patient the nature of atherosclerosis, and emphasized the importance of maximal medical management including strict control of blood pressure, blood glucose, and lipid levels, obtaining regular exercise, and cessation of smoking.  The patient is aware that without maximal medical management the underlying atherosclerotic disease process will progress, limiting the benefit of any interventions.  I emphasized the importance of routine surveillance of the patient's bypass, as the vascular surgery literature emphasize the improved patency possible with assisted primary patency procedures versus secondary patency procedures.   Thank you for allowing [place] to participate in this patient's care.  Charisse March, RN, MSN, FNP-C [facility] Office: [phone]  09/26/2017, 3:39 PM  Clinic MD: Darrick Penna

## 2017-09-26 NOTE — Progress Notes (Signed)
  Postoperative Visit   History of Present Illness  Jay Barber is a 37 y.o. year old male who is s/p R femoral to BK popliteal bypass with PTFE by Dr. Dickson on 08/09/17 for  right lower limb critical ischemia, infrainguinal arterial occlusive disease and 2 nonhealing wounds on his right foot.  He is also s/p amputation of right great toe by Dr. Duda on 05-11-13.    He was seen 5 days ago by M. Eveland PA-C for c/o pain and redness R foot x1 week.  Patient believed this was related to a pressure sore related to the way his foot was laying on his couch.  He stated R GT callus was stable and R dorsal foot wound was nearly completely healed.  He denied claudication or rest pain.  He had been back at work and up on his feet without restrictions.    At his visit on 09-21-17:  Early cellulitis picture distal foot secondary to pressure sore plantar surface of 5th metatarsal base  Patent bypass and RLE well perfused with palpable R PTA and DP and peroneal by doppler  500mg keflex BID for 14 days prescribed  Recommended diabetic shoes to limit pressure points  Encouraged smoking cessation  Patient states he will follow up with Podiatrist Dr. Jones if pressure sore fails to improve Follow up as scheduled 11/2017 for graft surveillance and ABIs.   He returns today with c/o right foot wounds drainage and right foot redness not improving.   States pain in the lateral aspect of his right foot seems to be less since draining occurred from the open wounds on his right foot 3 days ago.  He denies fever or chills.   He has an appointment tomorrow with a podiatrist in [place].  He is taking the prescribed Keflex since is visit on 09-21-17.    He is taking aspirin daily.    He continues to smoke tobacco daily.   He is a type 1 diabetic, diagnosed with DM in his 20's; last A1C result on file was 8.2 on 08-02-17, uncontrolled.   He was scheduled it appears to have ABI's and lower extremity  bypass graft duplex on 08-26-17 which was not done.  His next scheduled follow up is on10-2-19 with Dr. Dickson and the aforementioned tests.   0.81 was his serum creatinine on 08-09-17.  The patient is able to complete his activities of daily living.    For VQI Use Only  PRE-ADM LIVING: Home, accompanied by wife  AMB STATUS: Ambulatory    Past Medical History:  Diagnosis Date  . Depression   . Diabetes mellitus without complication (HCC)    takes Lantus and Novolog  . Drainage from wound    right big toe  . Family history of adverse reaction to anesthesia    mother had PONV  . Full dentures   . GERD (gastroesophageal reflux disease)   . History of bronchitis    only had once in his life-3+yrs ago  . Hypertension   . Nocturia   . Panic attacks    hx of but no meds required  . Peripheral vascular disease (HCC)   . Wears contact lenses     Past Surgical History:  Procedure Laterality Date  . ABDOMINAL AORTAGRAM N/A 11/06/2012   Procedure: ABDOMINAL AORTAGRAM;  Surgeon: Christopher S Dickson, MD;  Location: [facility] CATH [facility];  Service: Cardiovascular;  Laterality: N/A;  . ABDOMINAL AORTOGRAM N/A 06/03/2017   Procedure: ABDOMINAL AORTOGRAM;  Surgeon:   Dickson, Christopher S, MD;  Location: [facility] INVASIVE CV LAB;  Service: Cardiovascular;  Laterality: N/A;  . AMPUTATION Right 05/11/2013   Procedure: AMPUTATION DIGIT;  Surgeon: Marcus V Duda, MD;  Location: [facility] OR;  Service: Orthopedics;  Laterality: Right;  Right Great Toe Amputation at MTP  . ESOPHAGOGASTRODUODENOSCOPY    . FEMORAL ARTERY - POPLITEAL ARTERY BYPASS GRAFT  08/09/2017  . FEMORAL-POPLITEAL BYPASS GRAFT Right 08/09/2017   Procedure: BYPASS GRAFT FEMORAL-BELOW KNEE POPLITEAL ARTERY USING 6MM X 80CM GORE PROPATEN VASCULAR GRAFT;  Surgeon: Dickson, Christopher S, MD;  Location: [facility] OR;  Service: Vascular;  Laterality: Right;  . INTRAOPERATIVE ARTERIOGRAM Right 08/09/2017   Procedure: INTRA OPERATIVE ARTERIOGRAM RIGHT LOWER LEG;   Surgeon: Dickson, Christopher S, MD;  Location: [facility] OR;  Service: Vascular;  Laterality: Right;  . LOWER EXTREMITY ANGIOGRAPHY Bilateral 06/03/2017   Procedure: Lower Extremity Angiography;  Surgeon: Dickson, Christopher S, MD;  Location: [facility] INVASIVE CV [facility];  Service: Cardiovascular;  Laterality: Bilateral;  . MOUTH SURGERY    . MULTIPLE TOOTH EXTRACTIONS      Family History  Problem Relation Age of Onset  . Heart disease Father        before age 60  . Hypertension Mother   . Depression Mother   . GER disease Mother     Social History   Socioeconomic History  . Marital status: Single    Spouse name: Not on file  . Number of children: Not on file  . Years of education: Not on file  . Highest education level: Not on file  Occupational History  . Not on file  Social Needs  . Financial resource strain: Not on file  . Food insecurity:    Worry: Not on file    Inability: Not on file  . Transportation needs:    Medical: Not on file    Non-medical: Not on file  Tobacco Use  . Smoking status: Current Every Day Smoker    Packs/day: 0.50    Years: 14.00    Pack years: 7.00    Types: Cigarettes  . Smokeless tobacco: Never Used  . Tobacco comment: 1/2 pk per day  Substance and Sexual Activity  . Alcohol use: Yes    Comment: occaionally  . Drug use: No  . Sexual activity: Yes  Lifestyle  . Physical activity:    Days per week: Not on file    Minutes per session: Not on file  . Stress: Not on file  Relationships  . Social connections:    Talks on phone: Not on file    Gets together: Not on file    Attends religious service: Not on file    Active member of club or organization: Not on file    Attends meetings of clubs or organizations: Not on file    Relationship status: Not on file  . Intimate partner violence:    Fear of current or ex partner: Not on file    Emotionally abused: Not on file    Physically abused: Not on file    Forced sexual activity: Not on file  Other  Topics Concern  . Not on file  Social History Narrative  . Not on file    No Known Allergies  Current Outpatient Medications on File Prior to Visit  Medication Sig Dispense Refill  . ALPRAZolam (XANAX) 1 MG tablet Take 1 mg by mouth 2 (two) times daily as needed for anxiety.     . aspirin 81 MG tablet Take   81 mg by mouth daily.    . cephALEXin (KEFLEX) 500 MG capsule Take 1 capsule (500 mg total) by mouth 2 (two) times daily for 14 days. 28 capsule 0  . imipramine (TOFRANIL) 25 MG tablet Take 25 mg by mouth at bedtime.     . insulin aspart (NOVOLOG) 100 UNIT/ML injection Inject 2-4 Units into the skin 3 (three) times daily with meals. Per sliding scale    . insulin glargine (LANTUS) 100 UNIT/ML injection Inject 12 Units into the skin at bedtime.     . lisinopril (PRINIVIL,ZESTRIL) 20 MG tablet Take 20 mg by mouth daily.  5  . Multiple Vitamins-Minerals (ALIVE MENS ENERGY PO) Take 1 tablet by mouth daily.    . mupirocin ointment (BACTROBAN) 2 % Apply 1 application topically 2 (two) times daily.  2  . NICOTINE STEP 1 21 MG/24HR patch APPLY 1 PATCH DAILY  0  . omeprazole (PRILOSEC OTC) 20 MG tablet Take 20 mg by mouth daily as needed (for acid reflux).    . oxyCODONE-acetaminophen (PERCOCET/ROXICET) 5-325 MG tablet Take 1 tablet by mouth every 6 (six) hours as needed for moderate pain. 16 tablet 0  . simvastatin (ZOCOR) 20 MG tablet Take 1 tablet (20 mg total) by mouth daily at 6 PM. 30 tablet 5   No current facility-administered medications on file prior to visit.      Physical Examination  Vitals:   09/26/17 1529  BP: 133/85  Pulse: 94  Resp: 20  Temp: 98 F (36.7 C)  TempSrc: Oral  SpO2: 100%  Weight: 135 lb 8 oz (61.5 kg)  Height: 5' 11" (1.803 m)   Body mass index is 18.9 kg/m.  ROS: See HPI for pertinent positives and negatives   PHYSICAL EXAMINATION: General: The patient appears his stated age.   HEENT:  No gross abnormalities Pulmonary: Respirations are  non-labored, CTAB, slightly diminished air movement in all fields.  Abdomen: Soft and non-tender with normal bowel sounds Musculoskeletal: There are no major deformities. See Extremities.  Neurologic: No focal weakness or paresthesias are detected, Skin: There are no rashes, see Extremities. Psychiatric: The patient is irritable,curt.  Cardiovascular: There is a regular rate and rhythm without significant murmur appreciated.   Extremities: No signs of ischemia in his left foot or leg.   Right groin and right lower extremity bypass graft incisions are well healed.  Wound at mid/medial aspect of dorsum of right foot has healed. Right 1st toe is surgically absent. Right 5th toe with wet and dry gangrene.  Large shallow dry gangrene area at lateral/dorsal aspect of right foot (see photos below). Large amount of sero sanguinous drainage on the dressing that was removed, no drainage noted on examination.  Right foot is slightly ruddy, 1+ non pitting edema  2+ palpable right PT pulse. Bilateral femoral pulses are 2+ palpable.    Right foot    Right foot   Right foot   Medical Decision Making  Jay Barber is a 37 y.o. year old male who is s/p R femoral to BK popliteal bypass with PTFE by Dr. Dickson on 08/09/17 for  right lower limb critical ischemia, infrainguinal arterial occlusive disease and 2 nonhealing wounds on his right foot.  He is also s/p amputation of right great toe by Dr. Duda on 05-11-13.    He returns today, was here 5 days ago, with worsening wounds and redness of right foot.  He continues the prescribed Keflex; per pt, wounds of right foot started draining   3 days ago. Right PT pulse is 2+ palpable.  He continues to smoke.  His Type 1 DM is uncontrolled.     Dr. Fields spoke with pt and wife. Need right LE arterial duplex to see if bpg is open, and will check ABI's tomorrow at 8 and 9am. Pt was instructed to be NPO after MN tonight. Continue Keflex. Right  foot wounds dressed with telfa and kerlex.   Over 3 minutes was spent counseling patient re smoking cessation, and patient was given several free resources re smoking cessation.  I discussed in depth with the patient the nature of atherosclerosis, and emphasized the importance of maximal medical management including strict control of blood pressure, blood glucose, and lipid levels, obtaining regular exercise, and cessation of smoking.  The patient is aware that without maximal medical management the underlying atherosclerotic disease process will progress, limiting the benefit of any interventions.  I emphasized the importance of routine surveillance of the patient's bypass, as the vascular surgery literature emphasize the improved patency possible with assisted primary patency procedures versus secondary patency procedures.   Thank you for allowing us to participate in this patient's care.  Suzanne Nickel, RN, MSN, FNP-C [facility] Office: [phone]  09/26/2017, 3:39 PM  Clinic MD: Fields   

## 2017-09-27 ENCOUNTER — Other Ambulatory Visit: Payer: Self-pay

## 2017-09-27 ENCOUNTER — Ambulatory Visit (INDEPENDENT_AMBULATORY_CARE_PROVIDER_SITE_OTHER)
Admission: RE | Admit: 2017-09-27 | Discharge: 2017-09-27 | Disposition: A | Discharge status: Home / Self Care | Payer: Medicaid Other | Source: Ambulatory Visit | Attending: Vascular Surgery | Admitting: Vascular Surgery

## 2017-09-27 ENCOUNTER — Encounter (HOSPITAL_COMMUNITY): Payer: Self-pay | Admitting: *Deleted

## 2017-09-27 ENCOUNTER — Ambulatory Visit (HOSPITAL_COMMUNITY)
Admission: RE | Admit: 2017-09-27 | Discharge: 2017-09-27 | Disposition: A | Discharge status: Home / Self Care | Payer: Medicaid Other | Source: Ambulatory Visit | Attending: Vascular Surgery | Admitting: Vascular Surgery

## 2017-09-27 ENCOUNTER — Other Ambulatory Visit: Payer: Self-pay | Admitting: *Deleted

## 2017-09-27 DIAGNOSIS — F17200 Nicotine dependence, unspecified, uncomplicated: Secondary | ICD-10-CM | POA: Insufficient documentation

## 2017-09-27 DIAGNOSIS — R9389 Abnormal findings on diagnostic imaging of other specified body structures: Secondary | ICD-10-CM | POA: Diagnosis not present

## 2017-09-27 DIAGNOSIS — I998 Other disorder of circulatory system: Secondary | ICD-10-CM | POA: Insufficient documentation

## 2017-09-27 DIAGNOSIS — E119 Type 2 diabetes mellitus without complications: Secondary | ICD-10-CM | POA: Insufficient documentation

## 2017-09-27 NOTE — Progress Notes (Signed)
Jay Barber denies chest pain or shortness of breath.  Patient has Type I diabetes. Jay Barber takes 12 units of Lantus at hs and meal coverage.  I instructed patient to take 9 units of Lantus tonight. I instructed patient to check CBG after awaking and every 2 hours until arrival  to the hospital.  I Instructed patient if CBG is less than 70 to take 4 Glucose Tablets  juice. Recheck CBG in 15 minutes then call pre- op desk at [phone] for further instructions.  I also instructed patient to call pre- op desk if CBG > 220.  Last A1c was 8.2 drawn 10/02/17.  Patient does not see an endocrinologist. [facility] [place].

## 2017-09-28 ENCOUNTER — Inpatient Hospital Stay (HOSPITAL_COMMUNITY): Payer: Medicaid Other | Admitting: Certified Registered Nurse Anesthetist

## 2017-09-28 ENCOUNTER — Encounter (HOSPITAL_COMMUNITY): Payer: Self-pay | Admitting: Surgery

## 2017-09-28 ENCOUNTER — Ambulatory Visit (HOSPITAL_COMMUNITY)
Admission: RE | Admit: 2017-09-28 | Discharge: 2017-09-28 | Disposition: A | Discharge status: Home / Self Care | Payer: Medicaid Other | Source: Ambulatory Visit | Attending: Vascular Surgery | Admitting: Vascular Surgery

## 2017-09-28 ENCOUNTER — Encounter (HOSPITAL_COMMUNITY)
Admission: RE | Disposition: A | Discharge status: Home / Self Care | Payer: Self-pay | Source: Ambulatory Visit | Attending: Vascular Surgery

## 2017-09-28 DIAGNOSIS — F329 Major depressive disorder, single episode, unspecified: Secondary | ICD-10-CM | POA: Diagnosis not present

## 2017-09-28 DIAGNOSIS — Z794 Long term (current) use of insulin: Secondary | ICD-10-CM | POA: Diagnosis not present

## 2017-09-28 DIAGNOSIS — E1152 Type 2 diabetes mellitus with diabetic peripheral angiopathy with gangrene: Secondary | ICD-10-CM | POA: Insufficient documentation

## 2017-09-28 DIAGNOSIS — I96 Gangrene, not elsewhere classified: Secondary | ICD-10-CM | POA: Diagnosis not present

## 2017-09-28 DIAGNOSIS — K219 Gastro-esophageal reflux disease without esophagitis: Secondary | ICD-10-CM | POA: Diagnosis not present

## 2017-09-28 DIAGNOSIS — F17210 Nicotine dependence, cigarettes, uncomplicated: Secondary | ICD-10-CM | POA: Diagnosis not present

## 2017-09-28 DIAGNOSIS — Z79899 Other long term (current) drug therapy: Secondary | ICD-10-CM | POA: Diagnosis not present

## 2017-09-28 DIAGNOSIS — I10 Essential (primary) hypertension: Secondary | ICD-10-CM | POA: Diagnosis not present

## 2017-09-28 HISTORY — PX: APPLICATION OF WOUND VAC: SHX5189

## 2017-09-28 HISTORY — DX: Polyneuropathy in diseases classified elsewhere: G63

## 2017-09-28 HISTORY — DX: Endocrine disorder, unspecified: E34.9

## 2017-09-28 HISTORY — PX: AMPUTATION: SHX166

## 2017-09-28 LAB — BASIC METABOLIC PANEL
Anion gap: 10 (ref 5–15)
BUN: 7 mg/dL (ref 6–20)
CHLORIDE: 89 mmol/L — AB (ref 98–111)
CO2: 23 mmol/L (ref 22–32)
Calcium: 8.3 mg/dL — ABNORMAL LOW (ref 8.9–10.3)
Creatinine, Ser: 0.63 mg/dL (ref 0.61–1.24)
GFR calc Af Amer: >60 mL/min (ref >60)
GLUCOSE: 88 mg/dL (ref 70–99)
POTASSIUM: 4.4 mmol/L (ref 3.5–5.1)
SODIUM: 122 mmol/L — AB (ref 135–145)

## 2017-09-28 LAB — GLUCOSE, CAPILLARY
GLUCOSE-CAPILLARY: 77 mg/dL (ref 70–99)
Glucose-Capillary: 137 mg/dL — ABNORMAL HIGH (ref 70–99)
Glucose-Capillary: 156 mg/dL — ABNORMAL HIGH (ref 70–99)

## 2017-09-28 LAB — CBC
HCT: 27.8 % — ABNORMAL LOW (ref 39.0–52.0)
Hemoglobin: 9.8 g/dL — ABNORMAL LOW (ref 13.0–17.0)
MCH: 32.6 pg (ref 26.0–34.0)
MCHC: 35.3 g/dL (ref 30.0–36.0)
MCV: 92.4 fL (ref 78.0–100.0)
PLATELETS: 493 10*3/uL — AB (ref 150–400)
RBC: 3.01 MIL/uL — AB (ref 4.22–5.81)
RDW: 12.1 % (ref 11.5–15.5)
WBC: 9.5 10*3/uL (ref 4.0–10.5)

## 2017-09-28 SURGERY — AMPUTATION, FOOT, RAY
Anesthesia: Monitor Anesthesia Care | Site: Foot | Laterality: Right

## 2017-09-28 MED ORDER — 0.9 % SODIUM CHLORIDE (POUR BTL) OPTIME
TOPICAL | Status: DC | PRN
  Administered 2017-09-28: 1000 mL

## 2017-09-28 MED ORDER — MIDAZOLAM HCL 2 MG/2ML IJ SOLN
INTRAMUSCULAR | Status: AC
  Filled 2017-09-28: qty 2

## 2017-09-28 MED ORDER — FENTANYL CITRATE (PF) 100 MCG/2ML IJ SOLN: 25.0000 ug | INTRAMUSCULAR | Status: DC | PRN

## 2017-09-28 MED ORDER — FENTANYL CITRATE (PF) 100 MCG/2ML IJ SOLN
50.0000 ug | Freq: Once | INTRAMUSCULAR | Status: AC
  Administered 2017-09-28: 50 ug via INTRAVENOUS

## 2017-09-28 MED ORDER — MIDAZOLAM HCL 5 MG/5ML IJ SOLN
INTRAMUSCULAR | Status: DC | PRN
  Administered 2017-09-28: 2 mg via INTRAVENOUS

## 2017-09-28 MED ORDER — LIDOCAINE-EPINEPHRINE 0.5 %-1:200000 IJ SOLN
INTRAMUSCULAR | Status: AC
  Filled 2017-09-28: qty 1

## 2017-09-28 MED ORDER — BUPIVACAINE-EPINEPHRINE (PF) 0.5% -1:200000 IJ SOLN
INTRAMUSCULAR | Status: DC | PRN
  Administered 2017-09-28: 30 mL via PERINEURAL

## 2017-09-28 MED ORDER — OXYCODONE-ACETAMINOPHEN 5-325 MG PO TABS: 1.0000 | ORAL_TABLET | Freq: Four times a day (QID) | ORAL | 0 refills | Status: DC | PRN

## 2017-09-28 MED ORDER — LIDOCAINE 2% (20 MG/ML) 5 ML SYRINGE
INTRAMUSCULAR | Status: DC | PRN
  Administered 2017-09-28: 40 mg via INTRAVENOUS

## 2017-09-28 MED ORDER — DEXTROSE 50 % IV SOLN
25.0000 mL | Freq: Once | INTRAVENOUS | Status: AC
  Administered 2017-09-28: 25 mL via INTRAVENOUS
  Filled 2017-09-28: qty 50

## 2017-09-28 MED ORDER — PROPOFOL 10 MG/ML IV BOLUS
INTRAVENOUS | Status: DC | PRN
  Administered 2017-09-28 (×2): 20 mg via INTRAVENOUS

## 2017-09-28 MED ORDER — LIDOCAINE-EPINEPHRINE 0.5 %-1:200000 IJ SOLN
INTRAMUSCULAR | Status: DC | PRN
  Administered 2017-09-28: 50 mL

## 2017-09-28 MED ORDER — ONDANSETRON HCL 4 MG/2ML IJ SOLN: 4.0000 mg | Freq: Four times a day (QID) | INTRAMUSCULAR | Status: DC | PRN

## 2017-09-28 MED ORDER — FENTANYL CITRATE (PF) 100 MCG/2ML IJ SOLN
INTRAMUSCULAR | Status: AC
  Administered 2017-09-28: 50 ug via INTRAVENOUS
  Filled 2017-09-28: qty 2

## 2017-09-28 MED ORDER — PROPOFOL 1000 MG/100ML IV EMUL
INTRAVENOUS | Status: AC
  Filled 2017-09-28: qty 100

## 2017-09-28 MED ORDER — CEFAZOLIN SODIUM-DEXTROSE 2-4 GM/100ML-% IV SOLN
2.0000 g | INTRAVENOUS | Status: AC
  Administered 2017-09-28: 2 g via INTRAVENOUS
  Filled 2017-09-28: qty 100

## 2017-09-28 MED ORDER — PROPOFOL 500 MG/50ML IV EMUL
INTRAVENOUS | Status: DC | PRN
  Administered 2017-09-28: 50 ug/kg/min via INTRAVENOUS

## 2017-09-28 MED ORDER — DEXTROSE 50 % IV SOLN
INTRAVENOUS | Status: AC
  Filled 2017-09-28: qty 50

## 2017-09-28 MED ORDER — LIDOCAINE 2% (20 MG/ML) 5 ML SYRINGE
INTRAMUSCULAR | Status: AC
  Filled 2017-09-28: qty 5

## 2017-09-28 MED ORDER — FENTANYL CITRATE (PF) 100 MCG/2ML IJ SOLN
INTRAMUSCULAR | Status: DC | PRN
  Administered 2017-09-28: 25 ug via INTRAVENOUS
  Administered 2017-09-28 (×2): 50 ug via INTRAVENOUS
  Administered 2017-09-28: 25 ug via INTRAVENOUS

## 2017-09-28 MED ORDER — PHENYLEPHRINE 40 MCG/ML (10ML) SYRINGE FOR IV PUSH (FOR BLOOD PRESSURE SUPPORT)
PREFILLED_SYRINGE | INTRAVENOUS | Status: AC
  Filled 2017-09-28: qty 10

## 2017-09-28 MED ORDER — MIDAZOLAM HCL 2 MG/2ML IJ SOLN
INTRAMUSCULAR | Status: AC
Start: 2017-09-28 — End: 2017-09-28
  Administered 2017-09-28: 2 mg via INTRAVENOUS
  Filled 2017-09-28: qty 2

## 2017-09-28 MED ORDER — FENTANYL CITRATE (PF) 250 MCG/5ML IJ SOLN
INTRAMUSCULAR | Status: AC
  Filled 2017-09-28: qty 5

## 2017-09-28 MED ORDER — OXYCODONE HCL 5 MG/5ML PO SOLN: 5.0000 mg | Freq: Once | ORAL | Status: DC | PRN

## 2017-09-28 MED ORDER — MIDAZOLAM HCL 2 MG/2ML IJ SOLN
2.0000 mg | Freq: Once | INTRAMUSCULAR | Status: AC
Start: 2017-09-28 — End: 2017-09-28
  Administered 2017-09-28: 2 mg via INTRAVENOUS

## 2017-09-28 MED ORDER — SODIUM CHLORIDE 0.9 % IV SOLN: INTRAVENOUS | Status: DC

## 2017-09-28 MED ORDER — OXYCODONE HCL 5 MG PO TABS: 5.0000 mg | ORAL_TABLET | Freq: Once | ORAL | Status: DC | PRN

## 2017-09-28 MED ORDER — ONDANSETRON HCL 4 MG/2ML IJ SOLN
INTRAMUSCULAR | Status: DC | PRN
  Administered 2017-09-28: 4 mg via INTRAVENOUS

## 2017-09-28 MED ORDER — LACTATED RINGERS IV SOLN
INTRAVENOUS | Status: DC
  Administered 2017-09-28: 09:00:00 via INTRAVENOUS

## 2017-09-28 MED ORDER — ONDANSETRON HCL 4 MG/2ML IJ SOLN
INTRAMUSCULAR | Status: AC
  Filled 2017-09-28: qty 2

## 2017-09-28 SURGICAL SUPPLY — 34 items
BNDG CONFORM 2 STRL LF (GAUZE/BANDAGES/DRESSINGS) ×3 IMPLANT
BNDG GAUZE ELAST 4 BULKY (GAUZE/BANDAGES/DRESSINGS) ×3 IMPLANT
CANISTER SUCT 3000ML PPV (MISCELLANEOUS) ×3 IMPLANT
CLIP LIGATING EXTRA MED SLVR (CLIP) ×1 IMPLANT
CLIP LIGATING EXTRA SM BLUE (MISCELLANEOUS) ×1 IMPLANT
COVER SURGICAL LIGHT HANDLE (MISCELLANEOUS) ×3 IMPLANT
DRAPE EXTREMITY T 121X128X90 (DRAPE) ×3 IMPLANT
DRAPE HALF SHEET 40X57 (DRAPES) ×3 IMPLANT
DRESSING PEEL AND PLAC PRVNA20 (GAUZE/BANDAGES/DRESSINGS) IMPLANT
DRSG PEEL AND PLACE PREVENA 20 (GAUZE/BANDAGES/DRESSINGS) ×3
DRSG VAC ATS SM SENSATRAC (GAUZE/BANDAGES/DRESSINGS) ×2 IMPLANT
ELECT REM PT RETURN 9FT ADLT (ELECTROSURGICAL) ×3
ELECTRODE REM PT RTRN 9FT ADLT (ELECTROSURGICAL) ×1 IMPLANT
GAUZE SPONGE 4X4 12PLY STRL (GAUZE/BANDAGES/DRESSINGS) ×3 IMPLANT
GLOVE SS BIOGEL STRL SZ 7.5 (GLOVE) ×1 IMPLANT
GLOVE SUPERSENSE BIOGEL SZ 7.5 (GLOVE) ×2
GOWN STRL REUS W/ TWL LRG LVL3 (GOWN DISPOSABLE) ×3 IMPLANT
GOWN STRL REUS W/TWL LRG LVL3 (GOWN DISPOSABLE) ×9
KIT BASIN OR (CUSTOM PROCEDURE TRAY) ×3 IMPLANT
KIT DRSG PREVENA PLUS 7DAY 125 (MISCELLANEOUS) ×2 IMPLANT
KIT TURNOVER KIT B (KITS) ×3 IMPLANT
NEEDLE 22X1 1/2 (OR ONLY) (NEEDLE) IMPLANT
NS IRRIG 1000ML POUR BTL (IV SOLUTION) ×3 IMPLANT
PACK GENERAL/GYN (CUSTOM PROCEDURE TRAY) ×3 IMPLANT
PAD ARMBOARD 7.5X6 YLW CONV (MISCELLANEOUS) ×6 IMPLANT
SPECIMEN JAR SMALL (MISCELLANEOUS) ×1 IMPLANT
SUT ETHILON 3 0 PS 1 (SUTURE) ×3 IMPLANT
SUT VIC AB 3-0 SH 27 (SUTURE) ×3
SUT VIC AB 3-0 SH 27X BRD (SUTURE) ×1 IMPLANT
SYR CONTROL 10ML LL (SYRINGE) IMPLANT
TOWEL GREEN STERILE (TOWEL DISPOSABLE) ×6 IMPLANT
TOWEL GREEN STERILE FF (TOWEL DISPOSABLE) ×3 IMPLANT
UNDERPAD 30X30 (UNDERPADS AND DIAPERS) ×3 IMPLANT
WATER STERILE IRR 1000ML POUR (IV SOLUTION) ×3 IMPLANT

## 2017-09-28 NOTE — Anesthesia Preprocedure Evaluation (Signed)
Anesthesia Evaluation  Patient identified by MRN, date of birth, ID band Patient awake    Reviewed: Allergy & Precautions, H&P , NPO status , Patient's Chart, lab work & pertinent test results  Airway Mallampati: II   Neck ROM: full    Dental   Pulmonary Current Smoker,    breath sounds clear to auscultation       Cardiovascular hypertension, + Peripheral Vascular Disease   Rhythm:regular Rate:Normal     Neuro/Psych PSYCHIATRIC DISORDERS Anxiety Depression    GI/Hepatic GERD  ,  Endo/Other  diabetes, Type 1, Insulin Dependent  Renal/GU      Musculoskeletal   Abdominal   Peds  Hematology   Anesthesia Other Findings   Reproductive/Obstetrics                             Anesthesia Physical Anesthesia Plan  ASA: II  Anesthesia Plan: MAC and Regional   Post-op Pain Management:  Regional for Post-op pain   Induction: Intravenous  PONV Risk Score and Plan: 0 and Propofol infusion, Ondansetron, Midazolam and Treatment may vary due to age or medical condition  Airway Management Planned: Simple Face Mask  Additional Equipment:   Intra-op Plan:   Post-operative Plan:   Informed Consent: I have reviewed the patients History and Physical, chart, labs and discussed the procedure including the risks, benefits and alternatives for the proposed anesthesia with the patient or authorized representative who has indicated his/her understanding and acceptance.     Plan Discussed with: CRNA, Anesthesiologist and Surgeon  Anesthesia Plan Comments:         Anesthesia Quick Evaluation

## 2017-09-28 NOTE — Interval H&P Note (Signed)
History and Physical Interval Note:  09/28/2017 11:02 AM  Laurine BlazerJason B Bankhead  has presented today for surgery, with the diagnosis of Gangrene of right foot  The various methods of treatment have been discussed with the patient and family. After consideration of risks, benefits and other options for treatment, the patient has consented to  Procedure(s): AMPUTATION RAY 5TH TOE POSSIBLE 4TH TOE (Right) as a surgical intervention .  [name] history has been reviewed, patient examined, no change in status, stable for surgery.  I have reviewed the patient's chart and labs.  Questions were answered to the patient's satisfaction.     Gretta Beganodd Fran Mcree

## 2017-09-28 NOTE — Anesthesia Procedure Notes (Signed)
Procedure Name: MAC Date/Time: 09/28/2017 11:27 AM Performed by: Marsa Aris, CRNA Pre-anesthesia Checklist: Patient identified, Emergency Drugs available, Suction available and Patient being monitored Patient Re-evaluated:Patient Re-evaluated prior to induction Oxygen Delivery Method: Simple face mask Dental Injury: Teeth and Oropharynx as per pre-operative assessment

## 2017-09-28 NOTE — Transfer of Care (Signed)
Immediate Anesthesia Transfer of Care Note  Patient: Jay Barber  Procedure(s) Performed: AMPUTATION RAY 5TH TOE POSSIBLE 4TH TOE (Right Foot) APPLICATION OF WOUND VAC - RIGHT FOOT (Right Foot)  Patient Location: PACU  Anesthesia Type:MAC  Level of Consciousness: awake, alert  and oriented  Airway & Oxygen Therapy: Patient Spontanous Breathing  Post-op Assessment: Report given to RN and Post -op Vital signs reviewed and stable  Post vital signs: Reviewed and stable  Last Vitals:  Vitals Value Taken Time  BP 126/109 09/28/2017 12:34 PM  Temp    Pulse 94 09/28/2017 12:35 PM  Resp 20 09/28/2017 12:35 PM  SpO2 100 % 09/28/2017 12:35 PM  Vitals shown include unvalidated device data.  Last Pain:  Vitals:   09/28/17 0839  TempSrc:   PainSc: 0-No pain      Patients Stated Pain Goal: 3 (70/96/28 3662)  Complications: No apparent anesthesia complications

## 2017-09-28 NOTE — Care Management Note (Signed)
Case Management Note  Patient Details  Name: Laurine BlazerJason B Raudenbush MRN: [id number] Date of Birth: 07/30/1980  Subjective/Objective:         S/P amputation r metatarsal.            Action/Plan:  [facility] with first wound Vac dressing change. Forms [facility] fax[facility] and [facility], awaiting delivery of VAC. Per [facility] it will be later this afternoon around dinner time.     Expected Discharge Date:  09/28/17               Expected Discharge Plan:  Home w Home Health Services  In-House Referral:     Discharge planning Services  CM Consult  Post Acute Care Choice:  Durable Medical Equipment, Home Health Choice offered to:  Patient  DME Arranged:  Vac DME Agency:  [facility]  HH Arranged:  RN HH Agency:  [facility]  Status of Service:  Completed, signed off  If discussed at Long Length of Stay Meetings, dates discussed:    Additional Comments:  Lawerance SabalDebbie Minnie Shi, RN 09/28/2017, 2:17 PM

## 2017-09-28 NOTE — Anesthesia Postprocedure Evaluation (Signed)
Anesthesia Post Note  Patient: Jay Barber  Procedure(s) Performed: AMPUTATION RAY 5TH TOE POSSIBLE 4TH TOE (Right Foot) APPLICATION OF WOUND VAC - RIGHT FOOT (Right Foot)     Patient location during evaluation: PACU Anesthesia Type: Regional and MAC Level of consciousness: awake and alert Pain management: pain level controlled Vital Signs Assessment: post-procedure vital signs reviewed and stable Respiratory status: spontaneous breathing, nonlabored ventilation, respiratory function stable and patient connected to nasal cannula oxygen Cardiovascular status: stable and blood pressure returned to baseline Postop Assessment: no apparent nausea or vomiting Anesthetic complications: no    Last Vitals:  Vitals:   09/28/17 1538 09/28/17 1608  BP: 115/80 120/75  Pulse: 88 89  Resp: 17 18  Temp:    SpO2: 100% 100%    Last Pain:  Vitals:   09/28/17 1508  TempSrc:   PainSc: 0-No pain                 Illa Enlow S

## 2017-09-28 NOTE — Op Note (Signed)
    OPERATIVE REPORT  DATE OF SURGERY: 09/28/2017  PATIENT: Jay Barber, 37 y.o. 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  PHYSICIAN ASSISTANT: Nurse  ANESTHESIA: Ankle block  EBL: per anesthesia record  Total I/O In: 700 [I.V.:700] Out: 10 [Blood:10]  BLOOD ADMINISTERED: none  DRAINS: none  SPECIMEN: none  COUNTS CORRECT:  YES  PATIENT DISPOSITION:  PACU - hemodynamically stable  PROCEDURE DETAILS: Patient is status post right femoral to below-knee popliteal bypass with Gore-Tex with Dr.Chris Scot Dock on 08/09/2017.  He had nonhealing wounds on his foot.  When last seen in our office on 626 the wounds appear to be healing.  He presented on 729 with new gangrenous changes in his foot with gangrene of his fifth toe and gangrene on the dorsum laterally over the tendons.  This was clearly full-thickness with some fat necrosis underneath.  He was scheduled today for fifth toe amputation debridement and possible foot fourth toe amputation.  I met him in the preoperative holding area and discussed the treatment plan with the patient and his mother.  Explained the significant risk for nonhealing of this and eventual amputation at a higher level which showed a understand.  A ray amputation was accomplished by initially beginning in the webspace between the fourth and fifth toe and extending the incision onto the lateral and dorsal aspect of the foot to encompass all necrotic tissue.  The fourth metatarsal head did not appear to be involved.  The large area of defect on the dorsum laterally of his foot was completely resected.  The metatarsal head and tarsal bones were resected further proximally.  There was no evidence of purulence and there was good bleeding at this area.  Bleeding was  controlled with electrocautery and the wound was irrigated with saline.  A VAC sponge was cut to the appropriate size and her back dressing was placed on the open amputation site.  The patient did remain hemodynamically stable and was transferred to the recovery room where he will be discharged home today.  He will follow-up with Dr. Scot Dock in 1 week   Rosetta Posner, M.D., [facility] 09/28/2017 12:49 PM

## 2017-09-28 NOTE — Anesthesia Procedure Notes (Signed)
Anesthesia Regional Block: Popliteal block   Pre-Anesthetic Checklist: ,, timeout performed, Correct Patient, Correct Site, Correct Laterality, Correct Procedure, Correct Position, site marked, Risks and benefits discussed,  Surgical consent,  Pre-op evaluation,  At surgeon's request and post-op pain management  Laterality: Right  Prep: chloraprep       Needles:  Injection technique: Single-shot  Needle Type: Echogenic Stimulator Needle          Additional Needles:   Procedures:, nerve stimulator,,,,,,,   Nerve Stimulator or Paresthesia:  Response: plantar flexion of foot, 0.45 mA,   Additional Responses:   Narrative:  Start time: 09/28/2017 9:42 AM End time: 09/28/2017 9:50 AM Injection made incrementally with aspirations every 5 mL.  Performed by: Personally  Anesthesiologist: Achille RichHodierne, Kerman Pfost, MD  Additional Notes: Functioning IV was confirmed and monitors were applied.  A 90mm 21ga Arrow echogenic stimulator needle was used. Sterile prep and drape,hand hygiene and sterile gloves were used.  Negative aspiration and negative test dose prior to incremental administration of local anesthetic. The patient tolerated the procedure well.  Ultrasound guidance: relevent anatomy identified, needle position confirmed, local anesthetic spread visualized around nerve(s), vascular puncture avoided.  Image printed for medical record.

## 2017-09-29 ENCOUNTER — Encounter (HOSPITAL_COMMUNITY): Payer: Self-pay | Admitting: Vascular Surgery

## 2017-10-05 ENCOUNTER — Encounter: Payer: Self-pay | Admitting: Physician Assistant

## 2017-10-05 ENCOUNTER — Ambulatory Visit (INDEPENDENT_AMBULATORY_CARE_PROVIDER_SITE_OTHER): Payer: Medicaid Other | Admitting: Physician Assistant

## 2017-10-05 ENCOUNTER — Other Ambulatory Visit: Payer: Self-pay

## 2017-10-05 VITALS — BP 130/82 | HR 88 | Temp 97.3°F | Resp 16 | Ht 71.0 in | Wt 136.0 lb

## 2017-10-05 DIAGNOSIS — I739 Peripheral vascular disease, unspecified: Secondary | ICD-10-CM

## 2017-10-05 DIAGNOSIS — F17200 Nicotine dependence, unspecified, uncomplicated: Secondary | ICD-10-CM

## 2017-10-05 NOTE — Progress Notes (Signed)
    Postoperative Visit    History of Present Illness   Laurine BlazerJason B Weyland is a 37 y.o. male who presents for postoperative follow-up for: right 5th toe open ray amputation with wound vac placement by Dr. Arbie CookeyEarly due to gangrene of right fifth toe and right lateral foot (Date: 09/28/17).  Surgical history significant for right femoral to below the knee popliteal bypass with PTFE by Dr. Edilia Boickson 08/09/2017.  He is seen in office today however refuses to let myself or the nursing staff remove the wound VAC from his right foot to evaluate his wound.  Patient states he requires pain medication to do so and did not bring any with him.  He has a home health nurse who changes his vacuum dressing 3 times a week.  Patient states he only has pain with dressing changes and not at rest.  He is taking his aspirin and statin regimen daily.  He denies any fevers, chills, nausea/vomiting.    For VQI Use Only   PRE-ADM LIVING: Home  AMB STATUS: Ambulatory   Physical Examination   Vitals:   10/05/17 1515  BP: 130/82  Pulse: 88  Resp: 16  Temp: (!) 97.3 F (36.3 C)  SpO2: 100%    RLE: Right posterior tibial and peroneal brisk by Doppler; soft DP by Doppler; wound VAC dressing in place with good seal; patient refused dressing change; skin edges on plantar side of incision appears somewhat macerated; minimal collection in canister; no remarkable cellulitis of right foot; callus right great toe amp site healed   Medical Decision Making   Laurine BlazerJason B Neville is a 37 y.o. male who presents s/p right Open ray amputation of right fifth toe with VAC placement by Dr. Arbie CookeyEarly 09/28/17   Patient is perfusing his right foot adequately with three-vessel runoff by Doppler  Patient refuses to let me or nursing staff remove vacuum dressing today as he requires a pain pill to do so and did not bring this with him today  Limited exam of right foot amputation site however there is some maceration of skin edges on the plantar  edge; patient states home health nurse will be addressing his wound with a different sponge tomorrow in an attempt to remove more moisture from wound bed  Continue aspirin and statin  Again encouraged smoking cessation  Patient will follow-up with Dr. Edilia Boickson on 10/17/2017 and patient was made aware that we will need to fully examine his wound during this appointment  He will call or return to office sooner if rest pain develops or if amputation site worsens  Emilie RutterMatthew Desteni Piscopo PA-C [facility] [place] Office: [phone]

## 2017-10-11 ENCOUNTER — Other Ambulatory Visit: Payer: Self-pay | Admitting: *Deleted

## 2017-10-11 MED ORDER — OXYCODONE-ACETAMINOPHEN 5-325 MG PO TABS: 1.0000 | ORAL_TABLET | Freq: Four times a day (QID) | ORAL | 0 refills | Status: DC | PRN

## 2017-10-17 ENCOUNTER — Encounter: Payer: Self-pay | Admitting: Vascular Surgery

## 2017-10-17 ENCOUNTER — Ambulatory Visit (INDEPENDENT_AMBULATORY_CARE_PROVIDER_SITE_OTHER): Payer: Medicaid Other | Admitting: Vascular Surgery

## 2017-10-17 VITALS — BP 140/92 | HR 97 | Temp 97.5°F | Resp 20 | Ht 71.0 in | Wt 141.9 lb

## 2017-10-17 DIAGNOSIS — I739 Peripheral vascular disease, unspecified: Secondary | ICD-10-CM

## 2017-10-17 DIAGNOSIS — I96 Gangrene, not elsewhere classified: Secondary | ICD-10-CM

## 2017-10-17 NOTE — Progress Notes (Signed)
Patient name: Jay BlazerJason B Barber MRN: [id number] DOB: 07-10-1980 Sex: male  REASON FOR VISIT:   Follow-up after right fifth toe amputation.  HPI:   Jay Barber is a pleasant 37 y.o. male who was last seen by the physician's assistant on 10/05/2017.  This was for a follow-up visit after ray amputation of the right fifth toe by Dr. Tawanna Coolerodd Early due to gangrene of the right lateral foot.  I have performed a right femoral to below-knee popliteal artery bypass with PTFE on 08/09/2017.  At the time of his last visit he had good blood flow by Doppler.  He had a VAC in place.  He did not want to have this removed.  He was set up for a follow-up visit with me.  The patient has been having the VAC changed 3 times a week.  He has some pain with this and is very reluctant to let me debride the wound.  Current Outpatient Medications  Medication Sig Dispense Refill  . ALPRAZolam (XANAX) 1 MG tablet Take 1 mg by mouth 4 (four) times daily as needed for anxiety.     Marland Kitchen. aspirin 81 MG tablet Take 81 mg by mouth daily.    Marland Kitchen. imipramine (TOFRANIL) 25 MG tablet Take 25 mg by mouth at bedtime.     . insulin aspart (NOVOLOG) 100 UNIT/ML injection Inject 2-4 Units into the skin 3 (three) times daily with meals. Per sliding scale    . insulin glargine (LANTUS) 100 UNIT/ML injection Inject 12 Units into the skin at bedtime.     Marland Kitchen. lisinopril (PRINIVIL,ZESTRIL) 20 MG tablet Take 20 mg by mouth daily.  5  . Multiple Vitamins-Minerals (ALIVE MENS ENERGY PO) Take 1 tablet by mouth daily.    . mupirocin ointment (BACTROBAN) 2 % Apply 1 application topically 2 (two) times daily.  2  . NICOTINE STEP 1 21 MG/24HR patch APPLY 1 PATCH DAILY  0  . omeprazole (PRILOSEC OTC) 20 MG tablet Take 20 mg by mouth daily as needed (for acid reflux).    Marland Kitchen. oxyCODONE-acetaminophen (PERCOCET/ROXICET) 5-325 MG tablet Take 1 tablet by mouth every 6 (six) hours as needed for severe pain. 16 tablet 0  . simvastatin (ZOCOR) 20 MG tablet Take 1 tablet  (20 mg total) by mouth daily at 6 PM. 30 tablet 5   No current facility-administered medications for this visit.     REVIEW OF SYSTEMS:  [X]  denotes positive finding, [ ]  denotes negative finding Vascular    Leg swelling    Cardiac    Chest pain or chest pressure:    Shortness of breath upon exertion:    Short of breath when lying flat:    Irregular heart rhythm:    Constitutional    Fever or chills:     PHYSICAL EXAM:   Vitals:   10/17/17 1333  BP: (!) 140/92  Pulse: 97  Resp: 20  Temp: (!) 97.5 F (36.4 C)  TempSrc: Oral  SpO2: 100%  Weight: 141 lb 14.4 oz (64.4 kg)  Height: 5\' 11"  (1.803 m)    GENERAL: The patient is a well-nourished male, in no acute distress. The vital signs are documented above. CARDIOVASCULAR: There is a regular rate and rhythm. PULMONARY: There is good air exchange bilaterally without wheezing or rales. VASCULAR: He has a monophasic posterior tibial signal with the Doppler.  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 He has an extensive wound on the lateral aspect of his right foot which measures 9 cm  x 12 cm.  DATA:   I reviewed his completion arteriogram from 08/09/2017.  He had two-vessel runoff on the right via the posterior tibial and peroneal arteries.  MEDICAL ISSUES:   STATUS POST RAY AMPUTATION OF THE RIGHT FIFTH TOE AND LATERAL FOOT: The wound has some odor and has some fibrin exudate.  I think it would be best to discontinue the [facility] for now and do wet-to-dry dressing changes twice daily to help clean up the wound.  He does appear to have reasonable granulation tissue.  I will see him back in 3 to 4 weeks and will check his graft at that time.  We have again discussed the importance of tobacco cessation and the effect of nicotine on the microcirculation.  He knows to call sooner if he has problems.  Waverly Ferrarihristopher Maia Handa [facility] [phone]

## 2017-10-18 ENCOUNTER — Telehealth: Payer: Self-pay | Admitting: Vascular Surgery

## 2017-10-18 NOTE — Telephone Encounter (Addendum)
I called patient to get the name of his home health provider ([facility] [place] and the company he received the wound vac from [facility]([employer]).   [facility] wound changes to the right foot BID with normal saline, 4x4's, kerlix, and 4 inch ace bandage, per Dr. Edilia Boickson.  Wound vac d/c order was faxed toDawn Canary [facility]imSperry [phone]) [facility].     Ernst SpellAshleigh E., LPN

## 2017-10-20 ENCOUNTER — Other Ambulatory Visit: Payer: Self-pay

## 2017-10-20 DIAGNOSIS — I779 Disorder of arteries and arterioles, unspecified: Secondary | ICD-10-CM

## 2017-10-20 DIAGNOSIS — I739 Peripheral vascular disease, unspecified: Secondary | ICD-10-CM

## 2017-10-20 DIAGNOSIS — Z48812 Encounter for surgical aftercare following surgery on the circulatory system: Secondary | ICD-10-CM

## 2017-11-04 ENCOUNTER — Emergency Department (HOSPITAL_COMMUNITY)
Admission: EM | Admit: 2017-11-04 | Discharge: 2017-11-05 | Disposition: A | Discharge status: Other Acute Inpatient Hospital | Payer: Medicaid Other

## 2017-11-05 ENCOUNTER — Emergency Department (HOSPITAL_COMMUNITY)
Admission: EM | Admit: 2017-11-05 | Discharge: 2017-11-05 | Disposition: A | Discharge status: Home / Self Care | Payer: Medicaid Other | Attending: Emergency Medicine | Admitting: Emergency Medicine

## 2017-11-05 ENCOUNTER — Encounter (HOSPITAL_COMMUNITY): Payer: Self-pay

## 2017-11-05 ENCOUNTER — Other Ambulatory Visit: Payer: Self-pay

## 2017-11-05 DIAGNOSIS — Z9889 Other specified postprocedural states: Secondary | ICD-10-CM | POA: Insufficient documentation

## 2017-11-05 DIAGNOSIS — M7989 Other specified soft tissue disorders: Secondary | ICD-10-CM | POA: Diagnosis not present

## 2017-11-05 DIAGNOSIS — Z5321 Procedure and treatment not carried out due to patient leaving prior to being seen by health care provider: Secondary | ICD-10-CM | POA: Diagnosis not present

## 2017-11-05 NOTE — ED Triage Notes (Signed)
Patient c/o bilateral leg swelling x 3 weeks. Patient has had toe amputation and vein surgery on the right leg 1 month ago. Patient denies any SOB or chest pain.

## 2017-11-16 ENCOUNTER — Ambulatory Visit: Payer: Medicaid Other

## 2017-11-16 ENCOUNTER — Other Ambulatory Visit (HOSPITAL_COMMUNITY): Payer: Medicaid Other

## 2017-11-22 ENCOUNTER — Emergency Department (HOSPITAL_COMMUNITY): Payer: Medicaid Other

## 2017-11-22 ENCOUNTER — Encounter (HOSPITAL_COMMUNITY)
Admission: EM | Disposition: A | Discharge status: Home / Self Care | Payer: Self-pay | Source: Home / Self Care | Attending: Emergency Medicine

## 2017-11-22 ENCOUNTER — Emergency Department (HOSPITAL_COMMUNITY)
Admission: EM | Admit: 2017-11-22 | Discharge: 2017-11-22 | Disposition: A | Discharge status: Home / Self Care | Payer: Medicaid Other | Attending: Emergency Medicine | Admitting: Emergency Medicine

## 2017-11-22 ENCOUNTER — Encounter (HOSPITAL_COMMUNITY): Payer: Self-pay | Admitting: Emergency Medicine

## 2017-11-22 ENCOUNTER — Other Ambulatory Visit: Payer: Self-pay

## 2017-11-22 ENCOUNTER — Encounter (HOSPITAL_COMMUNITY): Payer: Medicaid Other

## 2017-11-22 ENCOUNTER — Inpatient Hospital Stay: Admit: 2017-11-22 | Payer: Medicaid Other | Admitting: Vascular Surgery

## 2017-11-22 DIAGNOSIS — Z79899 Other long term (current) drug therapy: Secondary | ICD-10-CM | POA: Diagnosis not present

## 2017-11-22 DIAGNOSIS — M79671 Pain in right foot: Secondary | ICD-10-CM | POA: Diagnosis present

## 2017-11-22 DIAGNOSIS — E1065 Type 1 diabetes mellitus with hyperglycemia: Secondary | ICD-10-CM | POA: Insufficient documentation

## 2017-11-22 DIAGNOSIS — I10 Essential (primary) hypertension: Secondary | ICD-10-CM | POA: Insufficient documentation

## 2017-11-22 DIAGNOSIS — Z794 Long term (current) use of insulin: Secondary | ICD-10-CM | POA: Diagnosis not present

## 2017-11-22 DIAGNOSIS — F17210 Nicotine dependence, cigarettes, uncomplicated: Secondary | ICD-10-CM | POA: Diagnosis not present

## 2017-11-22 DIAGNOSIS — Z7982 Long term (current) use of aspirin: Secondary | ICD-10-CM | POA: Insufficient documentation

## 2017-11-22 DIAGNOSIS — R739 Hyperglycemia, unspecified: Secondary | ICD-10-CM

## 2017-11-22 DIAGNOSIS — T82898A Other specified complication of vascular prosthetic devices, implants and grafts, initial encounter: Secondary | ICD-10-CM | POA: Diagnosis not present

## 2017-11-22 LAB — COMPREHENSIVE METABOLIC PANEL
ALBUMIN: 2.3 g/dL — AB (ref 3.5–5.0)
ALT: 14 U/L (ref 0–44)
AST: 17 U/L (ref 15–41)
Alkaline Phosphatase: 100 U/L (ref 38–126)
Anion gap: 11 (ref 5–15)
BUN: 14 mg/dL (ref 6–20)
CHLORIDE: 99 mmol/L (ref 98–111)
CO2: 22 mmol/L (ref 22–32)
CREATININE: 1.09 mg/dL (ref 0.61–1.24)
Calcium: 8.3 mg/dL — ABNORMAL LOW (ref 8.9–10.3)
GFR calc Af Amer: >60 mL/min (ref >60)
GFR calc non Af Amer: >60 mL/min (ref >60)
GLUCOSE: 335 mg/dL — AB (ref 70–99)
POTASSIUM: 5 mmol/L (ref 3.5–5.1)
SODIUM: 132 mmol/L — AB (ref 135–145)
Total Bilirubin: 0.7 mg/dL (ref 0.3–1.2)
Total Protein: 6.1 g/dL — ABNORMAL LOW (ref 6.5–8.1)

## 2017-11-22 LAB — CBC WITH DIFFERENTIAL/PLATELET
Abs Immature Granulocytes: 0.1 10*3/uL (ref 0.0–0.1)
Basophils Absolute: 0.2 10*3/uL — ABNORMAL HIGH (ref 0.0–0.1)
Basophils Relative: 2 %
EOS ABS: 0.1 10*3/uL (ref 0.0–0.7)
EOS PCT: 1 %
HEMATOCRIT: 34.3 % — AB (ref 39.0–52.0)
HEMOGLOBIN: 10.9 g/dL — AB (ref 13.0–17.0)
Immature Granulocytes: 1 %
LYMPHS ABS: 0.8 10*3/uL (ref 0.7–4.0)
LYMPHS PCT: 7 %
MCH: 30.8 pg (ref 26.0–34.0)
MCHC: 31.8 g/dL (ref 30.0–36.0)
MCV: 96.9 fL (ref 78.0–100.0)
MONO ABS: 0.8 10*3/uL (ref 0.1–1.0)
MONOS PCT: 7 %
Neutro Abs: 9.9 10*3/uL — ABNORMAL HIGH (ref 1.7–7.7)
Neutrophils Relative %: 82 %
Platelets: 409 10*3/uL — ABNORMAL HIGH (ref 150–400)
RBC: 3.54 MIL/uL — AB (ref 4.22–5.81)
RDW: 13.7 % (ref 11.5–15.5)
WBC: 12 10*3/uL — AB (ref 4.0–10.5)

## 2017-11-22 LAB — TYPE AND SCREEN
ABO/RH(D): O POS
ANTIBODY SCREEN: NEGATIVE

## 2017-11-22 LAB — SEDIMENTATION RATE: Sed Rate: 45 mm/hr — ABNORMAL HIGH (ref 0–16)

## 2017-11-22 LAB — C-REACTIVE PROTEIN: CRP: 12.4 mg/dL — AB (ref <1.0)

## 2017-11-22 SURGERY — BYPASS GRAFT FEMORAL-POPLITEAL ARTERY
Anesthesia: Choice | Laterality: Right

## 2017-11-22 MED ORDER — MORPHINE SULFATE (PF) 4 MG/ML IV SOLN
4.0000 mg | Freq: Once | INTRAVENOUS | Status: DC
Start: 2017-11-22 — End: 2017-11-22
  Filled 2017-11-22: qty 1

## 2017-11-22 MED ORDER — SULFAMETHOXAZOLE-TRIMETHOPRIM 800-160 MG PO TABS: 1.0000 | ORAL_TABLET | Freq: Two times a day (BID) | ORAL | 0 refills | Status: AC

## 2017-11-22 NOTE — ED Notes (Signed)
Called for pt to go back to bed. No response.

## 2017-11-22 NOTE — ED Notes (Signed)
Dr. Adele Danickson's office called asking to see if this pt was in the ER. This NS confirmed they are here. She stated that this pt will be having surgery once evaluated by Dr. Edilia Boickson for a thrombectomy of the graft. She requested to page Dr. Edilia Boickson. This NS did not catch the name of the woman calling from Dr. Adele Danickson's office. Page Dr. Edilia Boickson for Shirlyn GoltzEmily Shrosbree PA. Samantha w/ Dr. Edilia Boickson returned page and stated that Dr. Edilia Boickson is in surgery with another critical pt that came in before this pt and will return call to Shirlyn GoltzEmily Shrosbree PA once he's available. Attempted to notify Shirlyn GoltzEmily Shrosbree PA of this, couldn't reach her.

## 2017-11-22 NOTE — ED Notes (Signed)
Short Stay called for report. Pt in refusal to having surgery on his foot. Talked to short stay about sending MD to talk to patient once again about the importance of surgery. MD on his way to discuss matter with pt.

## 2017-11-22 NOTE — Discharge Instructions (Signed)
Please keep taking your bactrim.  Please continue to follow up with Dr. Durwin Noraixon and wound care.  If you develop fevers, worsening pain or concerning symptoms please seek additional medical care.

## 2017-11-22 NOTE — ED Provider Notes (Signed)
[facility] Provider Note   CSN: [id number] Arrival date & time: 11/22/17  1516     History   Chief Complaint Chief Complaint  Patient presents with  . Foot Pain    HPI Laurine BlazerJason B Vaile is a 37 y.o. male with past medical history of DM 1, hypertension, status post femoropopliteal bypass by Dr. Durwin Noraixon in July, who presents today to see Dr. Durwin Noraixon.  He was at the wound care center today and they sent him here for evaluation.  He has been having increased right foot pain since Sunday when he stubbed his toe on a dogs crate.  He says that over the past 24 hours his right fourth toe has become black and increasing painful.  He denies any fevers or chills, last p.o. intake was around 1 PM.  Not had increased drainage from his wound bed.  Patient reports that he does not want to spend the night, that even if amputation needs to be performed that he would not want it today and would want to go home and come back tomorrow.   HPI  Past Medical History:  Diagnosis Date  . Depression   . Diabetes mellitus without complication (HCC)    takes Lantus and Novolog Type I  . Drainage from wound    right big toe  . Family history of adverse reaction to anesthesia    mother had PONV- 09/27/17 - "years ago"  . Full dentures   . GERD (gastroesophageal reflux disease)   . History of bronchitis    only had once in his life-3+yrs ago  . Hypertension   . Neuropathy associated with endocrine disorder (HCC)   . Nocturia   . Panic attacks    hx of but no meds required  . Peripheral vascular disease (HCC)   . Wears contact lenses     Patient Active Problem List   Diagnosis Date Noted  . PAD (peripheral artery disease) (HCC) 08/09/2017  . Great toe amputation status, right (HCC) 08/23/2016  . Atherosclerosis of native arteries of the extremities with ulceration (HCC) 03/14/2013  . LEUKOCYTOSIS 02/02/2008  . ALCOHOL USE 02/02/2008  . HEPATIC DISEASE 02/02/2008    . SMOKER 08/23/2007  . COUGH 08/23/2007  . Type 1 diabetes mellitus (HCC) 02/01/2007  . INSOMNIA-SLEEP DISORDER-UNSPEC 02/01/2007  . URI 02/01/2007  . ANXIETY 09/29/2006  . HYPERTENSION 09/29/2006  . GERD 09/29/2006  . PULPITIS 10/15/2003  . 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 ABDOMINAL AORTAGRAM N/A 11/06/2012   Procedure: ABDOMINAL Ronny FlurryAORTAGRAM;  Surgeon: Chuck Hinthristopher S Dickson, MD;  [facility];  Service: Cardiovascular;  Laterality: N/A;  . ABDOMINAL AORTOGRAM N/A 06/03/2017   Procedure: ABDOMINAL AORTOGRAM;  Surgeon: Chuck Hintickson, Christopher S, MD;  [facility];  Service: Cardiovascular;  Laterality: N/A;  . AMPUTATION Right 05/11/2013   Procedure: AMPUTATION DIGIT;  Surgeon: Nadara MustardMarcus V Duda, MD;  Location: [facility] OR;  Service: Orthopedics;  Laterality: Right;  Right Great Toe Amputation at MTP  . AMPUTATION Right 09/28/2017   Procedure: AMPUTATION RAY 5TH TOE POSSIBLE 4TH TOE;  Surgeon: Larina EarthlyEarly, Todd F, MD;  Location: [facility] OR;  Service: Vascular;  Laterality: Right;  . APPLICATION OF WOUND VAC Right 09/28/2017   Procedure: APPLICATION OF WOUND VAC - RIGHT FOOT;  Surgeon: Larina EarthlyEarly, Todd F, MD;  Location: [facility] OR;  Service: Vascular;  Laterality: Right;  . ESOPHAGOGASTRODUODENOSCOPY    . FEMORAL ARTERY - POPLITEAL  ARTERY BYPASS GRAFT  08/09/2017  . FEMORAL-POPLITEAL BYPASS GRAFT Right 08/09/2017   Procedure: BYPASS GRAFT FEMORAL-BELOW KNEE POPLITEAL ARTERY USING X 80CM GORE PROPATEN VASCULAR GRAFT;  Surgeon: Chuck Hint, MD;  Location: [facility] OR;  Service: Vascular;  Laterality: Right;  . INTRAOPERATIVE ARTERIOGRAM Right 08/09/2017   Procedure: INTRA OPERATIVE ARTERIOGRAM RIGHT LOWER LEG;  Surgeon: Chuck Hint, MD;  Location[facility] OR;  Service: Vascular;  Laterality: Right;  . LOWER EXTREMITY ANGIOGRAPHY Bilateral 06/03/2017   Procedure: Lower Extremity Angiography;  Surgeon: Chuck Hint, MD;   Location: [facility] INVASIVE CV LAB;  Service: Cardiovascular;  Laterality: Bilateral;  . MOUTH SURGERY    . MULTIPLE TOOTH EXTRACTIONS          Home Medications    Prior to Admission medications   Medication Sig Start Date End Date Taking? Authorizing Provider  ALPRAZolam Prudy Feeler) 1 MG tablet Take 1 mg by mouth 4 (four) times daily as needed for anxiety.  02/21/14   [provider]  aspirin 81 MG tablet Take 81 mg by mouth daily.    [provider]  imipramine (TOFRANIL) 25 MG tablet Take 25 mg by mouth at bedtime.     [provider]  insulin aspart (NOVOLOG) 100 UNIT/ML injection Inject 2-4 Units into the skin 3 (three) times daily with meals. Per sliding scale    [provider]  insulin glargine (LANTUS) 100 UNIT/ML injection Inject 12 Units into the skin at bedtime.     [provider]  lisinopril (PRINIVIL,ZESTRIL) 20 MG tablet Take 20 mg by mouth daily. 08/30/17   [provider]  Multiple Vitamins-Minerals (ALIVE MENS ENERGY PO) Take 1 tablet by mouth daily.    [provider]  mupirocin ointment (BACTROBAN) 2 % Apply 1 application topically 2 (two) times daily. 05/15/17   [provider]  NICOTINE STEP 1 21 MG/24HR patch APPLY 1 PATCH DAILY 08/30/17   [provider]  omeprazole (PRILOSEC OTC) 20 MG tablet Take 20 mg by mouth daily as needed (for acid reflux).    [provider]  oxyCODONE-acetaminophen (PERCOCET/ROXICET) 5-325 MG tablet Take 1 tablet by mouth every 6 (six) hours as needed for severe pain. 10/11/17   Larina Earthly, MD  simvastatin (ZOCOR) 20 MG tablet Take 1 tablet (20 mg total) by mouth daily at 6 PM. 08/10/17   Emilie Rutter, PA-C  sulfamethoxazole-trimethoprim (BACTRIM DS,SEPTRA DS) 800-160 MG tablet Take 1 tablet by mouth 2 (two) times daily for 7 days. 11/22/17 11/29/17  Cristina Gong, PA-C    Family History Family History  Problem Relation Age of Onset  . Heart disease  Father        before age 80  . Hypertension Mother   . Depression Mother   . GER disease Mother     Social History Social History   Tobacco Use  . Smoking status: Current Every Day Smoker    Packs/day: 1.00    Years: 14.00    Pack years: 14.00    Types: Cigarettes  . Smokeless tobacco: Never Used  Substance Use Topics  . Alcohol use: Yes    Comment: occaionally  . Drug use: No     Allergies   Patient has no known allergies.   Review of Systems Review of Systems  Constitutional: Negative for chills and fever.  Skin: Positive for color change and wound.  Neurological: Negative for weakness.  All other systems reviewed and are negative.    Physical Exam  Updated Vital Signs BP 135/80 (BP Location: Left Arm)   Pulse (!) 108   Temp 98.4 F (36.9 C) (Oral)   Resp 18   SpO2 99%   Physical Exam  Constitutional: He appears well-developed and well-nourished.  HENT:  Head: Normocephalic and atraumatic.  Eyes: Conjunctivae are normal.  Neck: Neck supple.  Cardiovascular: Normal rate and regular rhythm.  No murmur heard. Able to palpate 1+ dp pulse on RLE. Aside from right 4th toe remainder of toes have brisk capillary refill.     Pulmonary/Chest: Effort normal and breath sounds normal. No respiratory distress.  Abdominal: Soft. There is no tenderness.  Musculoskeletal: He exhibits no edema.  Missing 5th and 1st toe to right foot.    Neurological: He is alert.  Skin: Skin is warm and dry. He is not diaphoretic.  Please see clinical images.  Right 4th toe is black, necrotic.  The wound on the lateral right foot is with red granulation tissue present and scant drainage.  No erythema over the right foot.   Psychiatric: He has a normal mood and affect.  Nursing note and vitals reviewed.          ED Treatments / Results  Labs (all labs ordered are listed, but only abnormal results are displayed) Labs Reviewed  COMPREHENSIVE METABOLIC PANEL - Abnormal; Notable  for the following components:      Result Value   Sodium 132 (*)    Glucose, Bld 335 (*)    Calcium 8.3 (*)    Total Protein 6.1 (*)    Albumin 2.3 (*)    All other components within normal limits  CBC WITH DIFFERENTIAL/PLATELET - Abnormal; Notable for the following components:   WBC 12.0 (*)    RBC 3.54 (*)    Hemoglobin 10.9 (*)    HCT 34.3 (*)    Platelets 409 (*)    Neutro Abs 9.9 (*)    Basophils Absolute 0.2 (*)    All other components within normal limits  SEDIMENTATION RATE - Abnormal; Notable for the following components:   Sed Rate 45 (*)    All other components within normal limits  C-REACTIVE PROTEIN - Abnormal; Notable for the following components:   CRP 12.4 (*)    All other components within normal limits  TYPE AND SCREEN    EKG None  Radiology Dg Foot Complete Right  Result Date: 11/22/2017 CLINICAL DATA:  Recent toe injury with pain and color change EXAM: RIGHT FOOT COMPLETE - 3+ VIEW COMPARISON:  11/04/2017 right foot radiographs FINDINGS: Redemonstration of proximal fifth transmetatarsal amputation and first toe amputation. New soft tissue gas throughout the right fourth toe. No acute fracture or dislocation. No acute osseous erosions. No acute periosteal reaction. Stable degenerative changes at the second and third metatarsophalangeal joints. No suspicious focal osseous lesions. No radiopaque foreign bodies. IMPRESSION: 1. New soft tissue gas throughout the right fourth toe suggesting ischemia/gangrene. 2. No acute osseous erosions or periosteal reaction to suggest acute osteomyelitis. 3. Postsurgical changes from proximal fifth transmetatarsal and first toe amputation. 4. Second and third MTP joint osteoarthritis. Electronically Signed   By: Delbert Phenix M.D.   On: 11/22/2017 19:04    Procedures Procedures (including critical care time)  Medications Ordered in ED Medications - No data to display   Initial Impression / Assessment and Plan / ED Course  I  have reviewed the triage vital signs and the nursing notes.  Pertinent labs & imaging results that were available during my  care of the patient were reviewed by me and considered in my medical decision making (see chart for details).  Clinical Course as of Nov 23 129  Tue Nov 22, 2017  1700 Spoke with Dr. Durwin Nora staff, as he is in the Or, who requested full labs, along with arterial ultrasound of the right leg.     [EH]  1816 Per Dr. Durwin Nora patient can go.  Once labs and Xr are obtained will discharge patient.    [EH]  1823 Patient is upset that he has not yet been discharged.  Explained to patient that his labs are pending and still waiting to go to x-ray.  Offered patient food, drink, anything to help him stay which he refused.    [EH]  1912 Touched base with Dr. Durwin Nora, he is aware of x-ray findings.  Patient to continue bactrim.    [EH]  1918 Patient informed of results.    [EH]    Clinical Course User Index [EH] Cristina Gong, PA-C   Patient presents today after referral from wound care to see Dr. Durwin Nora.  He reportedly kicked a dog crate on Saturday I spoke with Dr. Durwin Nora who requested arterial ultrasound of the right leg, along with basic screening labs, type and screen, and making patient n.p.o. in preparation for OR.  Labs and pain medicine were ordered, patient refused pain medicine.  X-ray shows gas in the tissue of the 4th toe.  Patient was seen by Dr. Durwin Nora who recommended discharge home.  He is aware of the large amount of gas subcutaneously in the fourth toe.  He recommended patient continue taking his Bactrim and follow-up in the office.  Patient reports that he is running out of Bactrim, therefore given prescription for 1 week of Bactrim.    Patient was also informed of his hyperglycemia.  He is aware he needs to maintain strict control of sugars at home.   Return precautions were discussed with patient who states their understanding.  At the time of discharge patient  denied any unaddressed complaints or concerns.  Patient is agreeable for discharge home.   Final Clinical Impressions(s) / ED Diagnoses   Final diagnoses:  Foot pain, right  Hyperglycemia    ED Discharge Orders         Ordered    sulfamethoxazole-trimethoprim (BACTRIM DS,SEPTRA DS) 800-160 MG tablet  2 times daily     11/22/17 1919           Norman Clay 11/23/17 0133    Lorre Nick, MD 11/23/17 [phone]

## 2017-11-22 NOTE — ED Triage Notes (Signed)
Pt states he is here to see Dr. Durwin Noraixon for evaluation of right foot pain. Surgery performed in July for toe amputation and vein surgery, reports increased pain.

## 2017-11-22 NOTE — ED Notes (Signed)
Pt to Vascular  

## 2017-11-22 NOTE — ED Notes (Signed)
Pt threatening to leave AMA. RN and PA talked with pt asked pt to please stay and have Xray. Pt states he will wait 30 more minutes.

## 2017-11-22 NOTE — Consult Note (Signed)
Patient name: Jay Barber MRN: [id number] DOB: 09-11-1980 Sex: male  REASON FOR CONSULT:   Occluded right femoropopliteal bypass graft.  The patient was referred from the wound care center.  HPI:   Jay Barber is a pleasant 37 y.o. male who presented with a nonhealing wound on his right foot.  On 08/09/2017, I performed a right femoral to below-knee popliteal artery bypass with a 6 mm PTFE graft.  I explored his vein and the vein was not adequate this as a bypass conduit.  He subsequently had re-amputation of the right fifth toe.   He was scheduled to have a duplex of his bypass graft soon but was being followed in the wound care center and reportedly had a duplex scan done at [facility] which showed that his graft was occluded.  He was sent to the emergency department.  On my history he had no sudden change in symptoms.  He denies any sudden change in the color or temperature of the right foot.  He denies any claudication or rest pain.  3 days ago he did accidentally kicked something and injured his adjacent fourth toe which became blue.  At the color of the foot itself did not change.  He is on Bactrim for some chronic cellulitis in the foot.  No current facility-administered medications for this encounter.    Current Outpatient Medications  Medication Sig Dispense Refill  . ALPRAZolam (XANAX) 1 MG tablet Take 1 mg by mouth 4 (four) times daily as needed for anxiety.     Marland Kitchen aspirin 81 MG tablet Take 81 mg by mouth daily.    Marland Kitchen imipramine (TOFRANIL) 25 MG tablet Take 25 mg by mouth at bedtime.     . insulin aspart (NOVOLOG) 100 UNIT/ML injection Inject 2-4 Units into the skin 3 (three) times daily with meals. Per sliding scale    . insulin glargine (LANTUS) 100 UNIT/ML injection Inject 12 Units into the skin at bedtime.     Marland Kitchen lisinopril (PRINIVIL,ZESTRIL) 20 MG tablet Take 20 mg by mouth daily.  5  . Multiple Vitamins-Minerals (ALIVE MENS ENERGY PO) Take 1 tablet by  mouth daily.    . mupirocin ointment (BACTROBAN) 2 % Apply 1 application topically 2 (two) times daily.  2  . NICOTINE STEP 1 21 MG/24HR patch APPLY 1 PATCH DAILY  0  . omeprazole (PRILOSEC OTC) 20 MG tablet Take 20 mg by mouth daily as needed (for acid reflux).    Marland Kitchen oxyCODONE-acetaminophen (PERCOCET/ROXICET) 5-325 MG tablet Take 1 tablet by mouth every 6 (six) hours as needed for severe pain. 16 tablet 0  . simvastatin (ZOCOR) 20 MG tablet Take 1 tablet (20 mg total) by mouth daily at 6 PM. 30 tablet 5    REVIEW OF SYSTEMS:  [X]  denotes positive finding, [ ]  denotes negative finding Vascular    Leg swelling x   Cardiac    Chest pain or chest pressure:    Shortness of breath upon exertion:    Short of breath when lying flat:    Irregular heart rhythm:    Constitutional    Fever or chills:     PHYSICAL EXAM:   Vitals:   11/22/17 1538  BP: 126/82  Pulse: 98  Resp: 18  Temp: 98.4 F (36.9 C)  TempSrc: Oral  SpO2: 100%    GENERAL: The patient is a well-nourished male, in no acute distress. The vital signs are documented above. CARDIOVASCULAR: There is a regular rate  and rhythm. PULMONARY: There is good air exchange bilaterally without wheezing or rales. VASCULAR: He has a palpable femoral pulse bilaterally. He has a fairly brisk posterior tibial signal with the Doppler on the right side. He has an extensive wound on the right lateral foot where he had a toe amputation.  This appears marginally perfused.  The fourth toe is discolored now.  DATA:   DUPLEX: He did have a duplex scan today which I have interpreted and shows an occluded right femoropopliteal bypass graft.  MEDICAL ISSUES:   OCCLUDED RIGHT FEMOROPOPLITEAL BYPASS GRAFT: It is difficult to determine when the patient's bypass graft occluded.  I suspect that this is occluded silently within the last month.  At this point I would not recommend chasing the graft as I do not think this occluded acutely in the last few  days.  I think he will need an arteriogram to further assess any options for a redo bypass.  I think his options will be very limited given that his vein was not adequate.  I look at his foot when he comes in for his arteriogram which will set up in the next week.  He will continue his Bactrim.  He will call sooner if there is any progression in his wounds of the right foot.  Waverly Ferrarihristopher Dickson [facility] [phone]

## 2017-11-22 NOTE — ED Provider Notes (Signed)
Patient placed in Quick Look pathway, seen and evaluated   Chief Complaint: Right foot wound  HPI:   Patient states he was sent here by wound clinic to see Dr. Edilia Boickson. States he is frustrated and has a wound on his foot and doesn't know why they sent him here.   Per chart review he has a history of peripheral arterial disease.  He had a right fifth toe and lateral foot amputation due to gangrene of the right lateral foot 09/28/2017.  Dr. Durwin Noraixon performed a right femoral to below-knee popliteal artery bypass 08/09/2017.  ROS: No fever  Physical Exam:   Gen: No distress  Neuro: Awake and Alert  Skin: Warm    Focused Exam: Patient refuses dressing removal right foot. He is A&O x 3. Heart rhythm and rate regular.    Initiation of care has begun. The patient has been counseled on the process, plan, and necessity for staying for the completion/evaluation, and the remainder of the medical screening examination    Lawrence MarseillesShrosbree, Laquentin Loudermilk J, PA-C 11/22/17 1649    Melene PlanFloyd, Dan, DO 11/22/17 2229

## 2017-11-22 NOTE — ED Notes (Signed)
Patient verbalizes understanding of discharge instructions. Opportunity for questioning and answers were provided. Armband removed by staff, pt discharged from ED in wheelchair.  

## 2017-11-23 ENCOUNTER — Other Ambulatory Visit: Payer: Self-pay | Admitting: *Deleted

## 2017-11-30 ENCOUNTER — Ambulatory Visit (INDEPENDENT_AMBULATORY_CARE_PROVIDER_SITE_OTHER): Payer: Medicaid Other | Admitting: Vascular Surgery

## 2017-11-30 ENCOUNTER — Inpatient Hospital Stay (HOSPITAL_COMMUNITY): Admission: RE | Admit: 2017-11-30 | Payer: Medicaid Other | Source: Ambulatory Visit

## 2017-11-30 ENCOUNTER — Encounter: Payer: Self-pay | Admitting: Vascular Surgery

## 2017-11-30 ENCOUNTER — Other Ambulatory Visit: Payer: Self-pay

## 2017-11-30 ENCOUNTER — Other Ambulatory Visit (HOSPITAL_COMMUNITY): Payer: Medicaid Other

## 2017-11-30 VITALS — BP 116/78 | HR 97 | Temp 97.9°F | Resp 16 | Ht 71.0 in | Wt 145.0 lb

## 2017-11-30 DIAGNOSIS — I70261 Atherosclerosis of native arteries of extremities with gangrene, right leg: Secondary | ICD-10-CM | POA: Diagnosis not present

## 2017-11-30 NOTE — H&P (View-Only) (Signed)
 Patient name: Jay Barber MRN: [id number] DOB: 09/14/1980 Sex: male  REASON FOR VISIT:   Occluded right femoropopliteal bypass  HPI:   Jay Barber is a pleasant 37 y.o. male who I saw in the emergency department on 11/22/2017.  The patient had originally presented with a nonhealing wound on the right foot.  On 08/09/2017 I performed a right femoral to below-knee popliteal artery bypass with a 6 mm PTFE graft.  The vein was not adequate and therefore prosthetic graft was used.  The patient subsequently had amputation of the right fifth toe.  Patient had been sent to the emergency department because of concerns that the foot was not getting better and at that time was found to have an occluded right femoropopliteal bypass graft.  It was difficult to determine when the graft had occluded.  The patient was on Bactrim at the time.  Patient had an extensive wound on the right lateral foot where he had undergone amputation.  Perfusion appears marginal.  The fourth toe was discolored.  Since I saw him in the emergency department of the wound has not changed significantly.  He has a large wound on the lateral aspect of his foot with reasonable granulation tissue.  The adjacent fourth toe is now ischemic.  He is previously had a great toe amputation on the right.  He has significant bilateral lower extremity swelling.  He does continue to take his Bactrim.  He smokes 1 PPD of cigarettes.   Current Outpatient Medications  Medication Sig Dispense Refill  . ALPRAZolam (XANAX) 1 MG tablet Take 1 mg by mouth 4 (four) times daily as needed for anxiety.     . aspirin 81 MG tablet Take 81 mg by mouth daily.    . imipramine (TOFRANIL) 25 MG tablet Take 25 mg by mouth at bedtime.     . insulin aspart (NOVOLOG) 100 UNIT/ML injection Inject 2-4 Units into the skin 3 (three) times daily with meals. Per sliding scale    . insulin glargine (LANTUS) 100 UNIT/ML injection Inject 12 Units into the skin at bedtime.      . lisinopril (PRINIVIL,ZESTRIL) 20 MG tablet Take 20 mg by mouth daily.  5  . Multiple Vitamins-Minerals (ALIVE MENS ENERGY PO) Take 1 tablet by mouth daily.    . NICOTINE STEP 1 21 MG/24HR patch Place 21 mg onto the skin daily.   0  . omeprazole (PRILOSEC OTC) 20 MG tablet Take 20 mg by mouth daily as needed (for acid reflux).    . simvastatin (ZOCOR) 20 MG tablet Take 1 tablet (20 mg total) by mouth daily at 6 PM. 30 tablet 5  . tiotropium (SPIRIVA) 18 MCG inhalation capsule Place 18 mcg into inhaler and inhale daily.     No current facility-administered medications for this visit.     REVIEW OF SYSTEMS:  [X] denotes positive finding, [ ] denotes negative finding Vascular    Leg swelling    Cardiac    Chest pain or chest pressure:    Shortness of breath upon exertion:    Short of breath when lying flat:    Irregular heart rhythm:    Constitutional    Fever or chills:     PHYSICAL EXAM:   Vitals:   11/30/17 1404  BP: 116/78  Pulse: 97  Resp: 16  Temp: 97.9 F (36.6 C)  TempSrc: Oral  SpO2: 100%  Weight: 145 lb (65.8 kg)  Height: 5' 11" (1.803 m)      GENERAL: The patient is a well-nourished male, in no acute distress. The vital signs are documented above. CARDIOVASCULAR: There is a regular rate and rhythm. PULMONARY: There is good air exchange bilaterally without wheezing or rales. VASCULAR: On the right side, he has a monophasic but reasonable posterior tibial signal with the Doppler.  He has a barely audible anterior tibial signal with the Doppler.  On the left side he has a monophasic but reasonable dorsalis pedis signal with no posterior tibial signal.  DATA:   No new data  MEDICAL ISSUES:   LIMB THREATENING ISCHEMIA RIGHT LOWER EXTREMITY: This patient has occluded right femoropopliteal bypass graft is likely occluded within the last month.  He has a nonhealing wound of the right foot.  He is at very high risk for limb loss.  We will plan on proceeding with  arteriography on the Monday to see if there is any options for revascularization.  If there is then he would also likely require transmetatarsal amputation.  However we have discussed the possibility of him ultimately requiring a below the knee amputation on the right he seems to have a good understanding of this.  We have also again had a long discussion about the importance of tobacco cessation.  We will make further recommendations pending the results of his arteriogram on Friday.  Roseline Ebarb [facility] [phone] 

## 2017-11-30 NOTE — H&P (View-Only) (Signed)
Patient name: Jay Barber MRN: [id number] DOB: [birth year] Sex: male  REASON FOR VISIT:   Occluded right femoropopliteal bypass  HPI:   Jay Barber is a pleasant 37 y.o. male who I saw in the emergency department on 11/22/2017.  The patient had originally presented with a nonhealing wound on the right foot.  On 08/09/2017 I performed a right femoral to below-knee popliteal artery bypass with a 6 mm PTFE graft.  The vein was not adequate and therefore prosthetic graft was used.  The patient subsequently had amputation of the right fifth toe.  Patient had been sent to the emergency department because of concerns that the foot was not getting better and at that time was found to have an occluded right femoropopliteal bypass graft.  It was difficult to determine when the graft had occluded.  The patient was on Bactrim at the time.  Patient had an extensive wound on the right lateral foot where he had undergone amputation.  Perfusion appears marginal.  The fourth toe was discolored.  Since I saw him in the emergency department of the wound has not changed significantly.  He has a large wound on the lateral aspect of his foot with reasonable granulation tissue.  The adjacent fourth toe is now ischemic.  He is previously had a great toe amputation on the right.  He has significant bilateral lower extremity swelling.  He does continue to take his Bactrim.  He smokes 1 PPD of cigarettes.   Current Outpatient Medications  Medication Sig Dispense Refill  . ALPRAZolam (XANAX) 1 MG tablet Take 1 mg by mouth 4 (four) times daily as needed for anxiety.     Marland Kitchen aspirin 81 MG tablet Take 81 mg by mouth daily.    Marland Kitchen imipramine (TOFRANIL) 25 MG tablet Take 25 mg by mouth at bedtime.     . insulin aspart (NOVOLOG) 100 UNIT/ML injection Inject 2-4 Units into the skin 3 (three) times daily with meals. Per sliding scale    . insulin glargine (LANTUS) 100 UNIT/ML injection Inject 12 Units into the skin at bedtime.      Marland Kitchen lisinopril (PRINIVIL,ZESTRIL) 20 MG tablet Take 20 mg by mouth daily.  5  . Multiple Vitamins-Minerals (ALIVE MENS ENERGY PO) Take 1 tablet by mouth daily.    Marland Kitchen NICOTINE STEP 1 21 MG/24HR patch Place 21 mg onto the skin daily.   0  . omeprazole (PRILOSEC OTC) 20 MG tablet Take 20 mg by mouth daily as needed (for acid reflux).    . simvastatin (ZOCOR) 20 MG tablet Take 1 tablet (20 mg total) by mouth daily at 6 PM. 30 tablet 5  . tiotropium (SPIRIVA) 18 MCG inhalation capsule Place 18 mcg into inhaler and inhale daily.     No current facility-administered medications for this visit.     REVIEW OF SYSTEMS:  [X]  denotes positive finding, [ ]  denotes negative finding Vascular    Leg swelling    Cardiac    Chest pain or chest pressure:    Shortness of breath upon exertion:    Short of breath when lying flat:    Irregular heart rhythm:    Constitutional    Fever or chills:     PHYSICAL EXAM:   Vitals:   11/30/17 1404  BP: 116/78  Pulse: 97  Resp: 16  Temp: 97.9 F (36.6 C)  TempSrc: Oral  SpO2: 100%  Weight: 145 lb (65.8 kg)  Height: 5\' 11"  (1.803 m)  GENERAL: The patient is a well-nourished male, in no acute distress. The vital signs are documented above. CARDIOVASCULAR: There is a regular rate and rhythm. PULMONARY: There is good air exchange bilaterally without wheezing or rales. VASCULAR: On the right side, he has a monophasic but reasonable posterior tibial signal with the Doppler.  He has a barely audible anterior tibial signal with the Doppler.  On the left side he has a monophasic but reasonable dorsalis pedis signal with no posterior tibial signal.  DATA:   No new data  MEDICAL ISSUES:   LIMB THREATENING ISCHEMIA RIGHT LOWER EXTREMITY: This patient has occluded right femoropopliteal bypass graft is likely occluded within the last month.  He has a nonhealing wound of the right foot.  He is at very high risk for limb loss.  We will plan on proceeding with  arteriography on the Monday to see if there is any options for revascularization.  If there is then he would also likely require transmetatarsal amputation.  However we have discussed the possibility of him ultimately requiring a below the knee amputation on the right he seems to have a good understanding of this.  We have also again had a long discussion about the importance of tobacco cessation.  We will make further recommendations pending the results of his arteriogram on Friday.  Waverly Ferrari [facility] [phone]

## 2017-11-30 NOTE — Progress Notes (Signed)
 Patient name: Jay Barber MRN: [id number] DOB: 09/21/1980 Sex: male  REASON FOR VISIT:   Occluded right femoropopliteal bypass  HPI:   Jay Barber is a pleasant 37 y.o. male who I saw in the emergency department on 11/22/2017.  The patient had originally presented with a nonhealing wound on the right foot.  On 08/09/2017 I performed a right femoral to below-knee popliteal artery bypass with a 6 mm PTFE graft.  The vein was not adequate and therefore prosthetic graft was used.  The patient subsequently had amputation of the right fifth toe.  Patient had been sent to the emergency department because of concerns that the foot was not getting better and at that time was found to have an occluded right femoropopliteal bypass graft.  It was difficult to determine when the graft had occluded.  The patient was on Bactrim at the time.  Patient had an extensive wound on the right lateral foot where he had undergone amputation.  Perfusion appears marginal.  The fourth toe was discolored.  Since I saw him in the emergency department of the wound has not changed significantly.  He has a large wound on the lateral aspect of his foot with reasonable granulation tissue.  The adjacent fourth toe is now ischemic.  He is previously had a great toe amputation on the right.  He has significant bilateral lower extremity swelling.  He does continue to take his Bactrim.  He smokes 1 PPD of cigarettes.   Current Outpatient Medications  Medication Sig Dispense Refill  . ALPRAZolam (XANAX) 1 MG tablet Take 1 mg by mouth 4 (four) times daily as needed for anxiety.     . aspirin 81 MG tablet Take 81 mg by mouth daily.    . imipramine (TOFRANIL) 25 MG tablet Take 25 mg by mouth at bedtime.     . insulin aspart (NOVOLOG) 100 UNIT/ML injection Inject 2-4 Units into the skin 3 (three) times daily with meals. Per sliding scale    . insulin glargine (LANTUS) 100 UNIT/ML injection Inject 12 Units into the skin at bedtime.      . lisinopril (PRINIVIL,ZESTRIL) 20 MG tablet Take 20 mg by mouth daily.  5  . Multiple Vitamins-Minerals (ALIVE MENS ENERGY PO) Take 1 tablet by mouth daily.    . NICOTINE STEP 1 21 MG/24HR patch Place 21 mg onto the skin daily.   0  . omeprazole (PRILOSEC OTC) 20 MG tablet Take 20 mg by mouth daily as needed (for acid reflux).    . simvastatin (ZOCOR) 20 MG tablet Take 1 tablet (20 mg total) by mouth daily at 6 PM. 30 tablet 5  . tiotropium (SPIRIVA) 18 MCG inhalation capsule Place 18 mcg into inhaler and inhale daily.     No current facility-administered medications for this visit.     REVIEW OF SYSTEMS:  [X] denotes positive finding, [ ] denotes negative finding Vascular    Leg swelling    Cardiac    Chest pain or chest pressure:    Shortness of breath upon exertion:    Short of breath when lying flat:    Irregular heart rhythm:    Constitutional    Fever or chills:     PHYSICAL EXAM:   Vitals:   11/30/17 1404  BP: 116/78  Pulse: 97  Resp: 16  Temp: 97.9 F (36.6 C)  TempSrc: Oral  SpO2: 100%  Weight: 145 lb (65.8 kg)  Height: 5' 11" (1.803 m)      GENERAL: The patient is a well-nourished male, in no acute distress. The vital signs are documented above. CARDIOVASCULAR: There is a regular rate and rhythm. PULMONARY: There is good air exchange bilaterally without wheezing or rales. VASCULAR: On the right side, he has a monophasic but reasonable posterior tibial signal with the Doppler.  He has a barely audible anterior tibial signal with the Doppler.  On the left side he has a monophasic but reasonable dorsalis pedis signal with no posterior tibial signal.  DATA:   No new data  MEDICAL ISSUES:   LIMB THREATENING ISCHEMIA RIGHT LOWER EXTREMITY: This patient has occluded right femoropopliteal bypass graft is likely occluded within the last month.  He has a nonhealing wound of the right foot.  He is at very high risk for limb loss.  We will plan on proceeding with  arteriography on the Monday to see if there is any options for revascularization.  If there is then he would also likely require transmetatarsal amputation.  However we have discussed the possibility of him ultimately requiring a below the knee amputation on the right he seems to have a good understanding of this.  We have also again had a long discussion about the importance of tobacco cessation.  We will make further recommendations pending the results of his arteriogram on Friday.  Alita Waldren [facility] [phone] 

## 2017-12-02 ENCOUNTER — Ambulatory Visit (HOSPITAL_COMMUNITY)
Admission: RE | Admit: 2017-12-02 | Discharge: 2017-12-02 | Disposition: A | Discharge status: Home / Self Care | Payer: Medicaid Other | Source: Ambulatory Visit | Attending: Vascular Surgery | Admitting: Vascular Surgery

## 2017-12-02 ENCOUNTER — Other Ambulatory Visit: Payer: Self-pay | Admitting: *Deleted

## 2017-12-02 ENCOUNTER — Ambulatory Visit (HOSPITAL_COMMUNITY)
Admission: RE | Disposition: A | Discharge status: Home / Self Care | Payer: Self-pay | Source: Ambulatory Visit | Attending: Vascular Surgery

## 2017-12-02 ENCOUNTER — Encounter (HOSPITAL_COMMUNITY): Payer: Self-pay | Admitting: Vascular Surgery

## 2017-12-02 DIAGNOSIS — L97529 Non-pressure chronic ulcer of other part of left foot with unspecified severity: Secondary | ICD-10-CM | POA: Insufficient documentation

## 2017-12-02 DIAGNOSIS — I70262 Atherosclerosis of native arteries of extremities with gangrene, left leg: Secondary | ICD-10-CM | POA: Diagnosis not present

## 2017-12-02 DIAGNOSIS — Z7982 Long term (current) use of aspirin: Secondary | ICD-10-CM | POA: Diagnosis not present

## 2017-12-02 DIAGNOSIS — I70261 Atherosclerosis of native arteries of extremities with gangrene, right leg: Secondary | ICD-10-CM

## 2017-12-02 DIAGNOSIS — F17210 Nicotine dependence, cigarettes, uncomplicated: Secondary | ICD-10-CM | POA: Diagnosis not present

## 2017-12-02 DIAGNOSIS — Z89412 Acquired absence of left great toe: Secondary | ICD-10-CM | POA: Insufficient documentation

## 2017-12-02 DIAGNOSIS — Z89511 Acquired absence of right leg below knee: Secondary | ICD-10-CM | POA: Diagnosis not present

## 2017-12-02 HISTORY — PX: ABDOMINAL AORTOGRAM W/LOWER EXTREMITY: CATH118223

## 2017-12-02 LAB — POCT I-STAT, CHEM 8
BUN: 10 mg/dL (ref 6–20)
CHLORIDE: 98 mmol/L (ref 98–111)
Calcium, Ion: 1.07 mmol/L — ABNORMAL LOW (ref 1.15–1.40)
Creatinine, Ser: 0.9 mg/dL (ref 0.61–1.24)
Glucose, Bld: 134 mg/dL — ABNORMAL HIGH (ref 70–99)
HCT: 26 % — ABNORMAL LOW (ref 39.0–52.0)
Hemoglobin: 8.8 g/dL — ABNORMAL LOW (ref 13.0–17.0)
POTASSIUM: 4.7 mmol/L (ref 3.5–5.1)
SODIUM: 127 mmol/L — AB (ref 135–145)
TCO2: 22 mmol/L (ref 22–32)

## 2017-12-02 LAB — GLUCOSE, CAPILLARY: GLUCOSE-CAPILLARY: 122 mg/dL — AB (ref 70–99)

## 2017-12-02 SURGERY — ABDOMINAL AORTOGRAM W/LOWER EXTREMITY
Anesthesia: LOCAL

## 2017-12-02 MED ORDER — SODIUM CHLORIDE 0.9 % IV SOLN
INTRAVENOUS | Status: DC
  Administered 2017-12-02: 08:00:00 via INTRAVENOUS

## 2017-12-02 MED ORDER — FENTANYL CITRATE (PF) 100 MCG/2ML IJ SOLN
INTRAMUSCULAR | Status: AC
  Filled 2017-12-02: qty 2

## 2017-12-02 MED ORDER — MIDAZOLAM HCL 2 MG/2ML IJ SOLN
INTRAMUSCULAR | Status: DC | PRN
  Administered 2017-12-02: 1 mg via INTRAVENOUS

## 2017-12-02 MED ORDER — SODIUM CHLORIDE 0.9% FLUSH: 3.0000 mL | INTRAVENOUS | Status: DC | PRN

## 2017-12-02 MED ORDER — MIDAZOLAM HCL 2 MG/2ML IJ SOLN
INTRAMUSCULAR | Status: AC
  Filled 2017-12-02: qty 2

## 2017-12-02 MED ORDER — SODIUM CHLORIDE 0.9 % IV SOLN: 250.0000 mL | INTRAVENOUS | Status: DC | PRN

## 2017-12-02 MED ORDER — HEPARIN (PORCINE) IN NACL 1000-0.9 UT/500ML-% IV SOLN
INTRAVENOUS | Status: AC
  Filled 2017-12-02: qty 1000

## 2017-12-02 MED ORDER — SODIUM CHLORIDE 0.9% FLUSH: 3.0000 mL | Freq: Two times a day (BID) | INTRAVENOUS | Status: DC

## 2017-12-02 MED ORDER — ONDANSETRON HCL 4 MG/2ML IJ SOLN: 4.0000 mg | Freq: Four times a day (QID) | INTRAMUSCULAR | Status: DC | PRN

## 2017-12-02 MED ORDER — ACETAMINOPHEN 325 MG PO TABS: 650.0000 mg | ORAL_TABLET | ORAL | Status: DC | PRN

## 2017-12-02 MED ORDER — HYDRALAZINE HCL 20 MG/ML IJ SOLN: 5.0000 mg | INTRAMUSCULAR | Status: DC | PRN

## 2017-12-02 MED ORDER — LIDOCAINE HCL (PF) 1 % IJ SOLN
INTRAMUSCULAR | Status: AC
  Filled 2017-12-02: qty 30

## 2017-12-02 MED ORDER — SODIUM CHLORIDE 0.9 % WEIGHT BASED INFUSION: 1.0000 mL/kg/h | INTRAVENOUS | Status: DC

## 2017-12-02 MED ORDER — LIDOCAINE HCL (PF) 1 % IJ SOLN
INTRAMUSCULAR | Status: DC | PRN
  Administered 2017-12-02: 20 mL

## 2017-12-02 MED ORDER — IODIXANOL 320 MG/ML IV SOLN
INTRAVENOUS | Status: DC | PRN
  Administered 2017-12-02: 147 mL via INTRA_ARTERIAL

## 2017-12-02 MED ORDER — LABETALOL HCL 5 MG/ML IV SOLN: 10.0000 mg | INTRAVENOUS | Status: DC | PRN

## 2017-12-02 MED ORDER — FENTANYL CITRATE (PF) 100 MCG/2ML IJ SOLN
INTRAMUSCULAR | Status: DC | PRN
  Administered 2017-12-02: 50 ug via INTRAVENOUS

## 2017-12-02 SURGICAL SUPPLY — 10 items
CATH ANGIO 5F PIGTAIL 65CM (CATHETERS) ×1 IMPLANT
KIT MICROPUNCTURE NIT STIFF (SHEATH) ×1 IMPLANT
KIT PV (KITS) ×2 IMPLANT
SHEATH PINNACLE 5F 10CM (SHEATH) ×1 IMPLANT
SHEATH PROBE COVER 6X72 (BAG) ×1 IMPLANT
STOPCOCK MORSE 400PSI 3WAY (MISCELLANEOUS) ×1 IMPLANT
SYRINGE MEDRAD AVANTA MACH 7 (SYRINGE) ×1 IMPLANT
TRANSDUCER W/STOPCOCK (MISCELLANEOUS) ×2 IMPLANT
TRAY PV CATH (CUSTOM PROCEDURE TRAY) ×2 IMPLANT
TUBING CIL FLEX 10 FLL-RA (TUBING) ×1 IMPLANT

## 2017-12-02 NOTE — Op Note (Signed)
PATIENT: Jay Barber      MRN: [id number] DOB: 1980-05-15    DATE OF PROCEDURE: 12/02/2017  INDICATIONS:    Jay Barber is a 37 y.o. male who is undergone a previous right femoral to below-knee popliteal artery bypass with a 6 mm PTFE graft as his vein was not adequate in size.  This was in June of this year.  He had amputation of the right great toe previously.  Most recently he had ray amputation of the fifth toe.  He subsequently developed a very extensive wound on the lateral aspect of the foot and now has gangrene of the fourth toe.  He has cellulitis and a progressing extensive right lower extremity wound.  PROCEDURE:    1.  Conscious sedation 2.  Ultrasound-guided access to the left common femoral artery 3.  Aortogram with bilateral iliac arteriogram and bilateral lower extremity runoff  SURGEON: Di Kindle. Edilia Bo, MD, FACS  ANESTHESIA: Local with sedation  EBL: Minimal  TECHNIQUE: The patient was taken to the peripheral vascular lab and was sedated. The period of conscious sedation was 44 minutes.  During that time period, I was present face-to-face 100% of the time.  The patient was administered 1 mg of Versed and 50 mcg of fentanyl. [name] heart rate, blood pressure, and oxygen saturation were monitored by the nurse continuously during the procedure.  Both groins were prepped and draped in the usual sterile fashion.  Under ultrasound guidance, after the skin was anesthetized, I cannulated the left common femoral artery with a micropuncture needle and a micropuncture sheath was introduced over a wire.  This was exchanged for a 5 [place] sheath over a Bentson wire.  By ultrasound the femoral artery was patent. A real-time image was obtained and placed in the chart.   A pigtail catheter was positioned at the L1 vertebral body and flush aortogram obtained.  The catheter was then positioned above the aortic bifurcation and bilateral lower extremity runoff films were  obtained.  There was some moderate stenosis in the left external iliac artery.  I measured a pressure in the pigtail catheter in the distal aorta and then remove this over a wire and measured a pressure in the left femoral sheath.  There was minimal pressure gradient at rest noted across the stenosis.  A retrograde left femoral arteriogram was obtained to obtain additional spot films of the left leg.  The patient was then transferred to the holding area for removal of the sheath.  No immediate complications were noted.  FINDINGS:   1.  Single renal arteries bilaterally with no significant renal artery stenosis identified.  The infrarenal aorta is widely patent. 2.  On the right side, which is the side of concern, the common iliac, external iliac and hypogastric arteries are patent.  The common femoral and deep femoral artery are patent.  The femoropopliteal bypass graft on the right is occluded.  There is a tight stenosis in the proximal right superficial femoral artery.  The artery below that is patent to the distal thigh where the distal superficial femoral artery is occluded.  The popliteal artery is occluded.  There is reconstitution of the anterior tibial artery, tibial peroneal trunk peroneal and posterior tibial arteries. 3.  On the left side, the common iliac artery and hypogastric artery are patent.  There is mild disease noted in the left external iliac artery with no significant pressure gradient rest.  The common femoral, superficial femoral, deep femoral arteries are patent.  There is moderate severe disease in the popliteal artery.  The posterior tibial artery on the left is occluded.  The proximal anterior tibial artery has a short segment occlusion but is patent beyond that.  The peroneal artery on the left is patent.  Thus there is two-vessel runoff on the left via the anterior tibial and peroneal arteries.  CLINICAL NOTE: This patient could potentially have a redo right femoral to  posterior tibial or anterior tibial artery bypass but this would have to be done with prosthetic graft as this vein is not adequate.  I do not think this would have good long-term results.  Regardless, given the extent of the right foot wound and persistent cellulitis I think even with successful revascularization he would be at high risk for limb loss.  For this reason I think his best option would be primary right below the knee amputation.  This would get him walking the quickest.  He is in agreement with this plan.  He has some issues next week and would like to schedule this the following week.  Waverly Ferrari, MD, FACS [facility]  DATE OF DICTATION:   12/02/2017

## 2017-12-02 NOTE — Progress Notes (Signed)
Site area:Left groin a 5 french arterial sheath was removed  Site Prior to Removal:  Level 0  Pressure Applied For 20 MINUTES     Bedrest Beginning at  1050am  Manual:   Yes.    Patient Status During Pull:  stable  Post Pull Groin Site:  Level 0  Post Pull Instructions Given:  Yes.    Post Pull Pulses Present:  Yes.    Dressing Applied:  Yes.    Comments:  VS remain stable

## 2017-12-02 NOTE — Discharge Instructions (Signed)

## 2017-12-02 NOTE — Interval H&P Note (Signed)
History and Physical Interval Note:  12/02/2017 8:55 AM  Jay Barber  has presented today for surgery, with the diagnosis of pvd ulcer  The various methods of treatment have been discussed with the patient and family. After consideration of risks, benefits and other options for treatment, the patient has consented to  Procedure(s): ABDOMINAL AORTOGRAM W/LOWER EXTREMITY (N/A) as a surgical intervention .  [name] history has been reviewed, patient examined, no change in status, stable for surgery.  I have reviewed the patient's chart and labs.  Questions were answered to the patient's satisfaction.     Waverly Ferrari

## 2017-12-05 ENCOUNTER — Other Ambulatory Visit: Payer: Self-pay | Admitting: *Deleted

## 2017-12-06 MED FILL — Heparin Sod (Porcine)-NaCl IV Soln 1000 Unit/500ML-0.9%: INTRAVENOUS | Qty: 1000 | Status: AC

## 2017-12-09 ENCOUNTER — Encounter: Payer: Self-pay | Admitting: Vascular Surgery

## 2017-12-12 ENCOUNTER — Encounter (HOSPITAL_COMMUNITY): Payer: Self-pay | Admitting: *Deleted

## 2017-12-12 ENCOUNTER — Telehealth: Payer: Self-pay | Admitting: *Deleted

## 2017-12-12 NOTE — Anesthesia Preprocedure Evaluation (Addendum)
Anesthesia Evaluation  Patient identified by MRN, date of birth, ID band Patient awake    Reviewed: Allergy & Precautions, NPO status , Patient's Chart, lab work & pertinent test results  History of Anesthesia Complications Negative for: history of anesthetic complications  Airway Mallampati: II  TM Distance: >3 FB Neck ROM: Full    Dental  (+) Edentulous Lower, Edentulous Upper   Pulmonary Current Smoker,    breath sounds clear to auscultation       Cardiovascular hypertension, Pt. on medications (-) angina+ Peripheral Vascular Disease   Rhythm:Regular Rate:Normal     Neuro/Psych PSYCHIATRIC DISORDERS Anxiety Depression negative neurological ROS     GI/Hepatic Neg liver ROS, GERD  Controlled and Medicated,  Endo/Other  diabetes, Type 2, Insulin Dependent  Renal/GU negative Renal ROS     Musculoskeletal negative musculoskeletal ROS (+)   Abdominal   Peds  Hematology  (+) anemia ,   Anesthesia Other Findings   Reproductive/Obstetrics                            Anesthesia Physical Anesthesia Plan  ASA: III  Anesthesia Plan: General   Post-op Pain Management:  Regional for Post-op pain   Induction: Intravenous  PONV Risk Score and Plan: 3 and Treatment may vary due to age or medical condition, Ondansetron, Midazolam and Scopolamine patch - Pre-op  Airway Management Planned: LMA  Additional Equipment: None  Intra-op Plan:   Post-operative Plan: Extubation in OR  Informed Consent: I have reviewed the patients History and Physical, chart, labs and discussed the procedure including the risks, benefits and alternatives for the proposed anesthesia with the patient or authorized representative who has indicated his/her understanding and acceptance.   Dental advisory given  Plan Discussed with: CRNA and Anesthesiologist  Anesthesia Plan Comments:        Anesthesia Quick  Evaluation

## 2017-12-12 NOTE — Telephone Encounter (Signed)
Discussed with patient's mother that we will get Jay Barber started with prosthestics asap. I have called [employer] and made them aware of patient's surgery tomorrow (right BKA). Patient is anxious about surgery and mom feels that he needs one on one information from prosthetist while in the hospital.

## 2017-12-12 NOTE — Progress Notes (Signed)
Patient denies chest pain, shob, or cardiology visit. Below instructions provided regarding DM management.    How to Manage Your Diabetes Before and After Surgery  o  . If your blood sugar is less than 70 mg/dL, you will need to treat for low blood sugar: o Do not take insulin. o Treat a low blood sugar (less than 70 mg/dL) with  cup of clear juice (cranberry or apple), 4 glucose tablets, OR glucose gel. Recheck blood sugar in 15 minutes after treatment (to make sure it is greater than 70 mg/dL). If your blood sugar is not greater than 70 mg/dL on recheck, call [phone] o  for further instructions. . Report your blood sugar to the short stay nurse when you get to Short Stay.  . If you are admitted to the hospital after surgery: o Your blood sugar will be checked by the staff and you will probably be given insulin after surgery (instead of oral diabetes medicines) to make sure you have good blood sugar levels. o The goal for blood sugar control after surgery is 80-180 mg/dL.              WHAT DO I DO ABOUT MY DIABETES MEDICATION?   Marland Kitchen Do not take oral diabetes medicines (pills) the morning of surgery.  . THE NIGHT BEFORE SURGERY, take ___________ units of ___________insulin.       . THE MORNING OF SURGERY, take ____9_________ units of ____Lantus______insulin.  . The day of surgery, do not take other diabetes injectables, including Byetta (exenatide), Bydureon (exenatide ER), Victoza (liraglutide), or Trulicity (dulaglutide).  . If your CBG is greater than 220 mg/dL, you may take  of your sliding scale (correction) dose of insulin.  Other Instructions:          Patient Signature:  Date:   Nurse Signature:  Date:   Reviewed and Endorsed by [facility] Patient Education Committee, August 2015

## 2017-12-13 ENCOUNTER — Inpatient Hospital Stay (HOSPITAL_COMMUNITY): Payer: Medicare Other | Admitting: Anesthesiology

## 2017-12-13 ENCOUNTER — Inpatient Hospital Stay (HOSPITAL_COMMUNITY)
Admission: RE | Admit: 2017-12-13 | Discharge: 2017-12-15 | DRG: 240 | Disposition: A | Discharge status: Rehabilitation Facility | Payer: Medicare Other | Source: Ambulatory Visit | Attending: Vascular Surgery | Admitting: Vascular Surgery

## 2017-12-13 ENCOUNTER — Other Ambulatory Visit: Payer: Self-pay

## 2017-12-13 ENCOUNTER — Encounter (HOSPITAL_COMMUNITY)
Admission: RE | Disposition: A | Discharge status: Rehabilitation Facility | Payer: Self-pay | Source: Ambulatory Visit | Attending: Vascular Surgery

## 2017-12-13 ENCOUNTER — Encounter (HOSPITAL_COMMUNITY): Payer: Self-pay | Admitting: Anesthesiology

## 2017-12-13 DIAGNOSIS — E46 Unspecified protein-calorie malnutrition: Secondary | ICD-10-CM | POA: Diagnosis present

## 2017-12-13 DIAGNOSIS — K219 Gastro-esophageal reflux disease without esophagitis: Secondary | ICD-10-CM | POA: Diagnosis present

## 2017-12-13 DIAGNOSIS — Z886 Allergy status to analgesic agent status: Secondary | ICD-10-CM

## 2017-12-13 DIAGNOSIS — K5900 Constipation, unspecified: Secondary | ICD-10-CM | POA: Diagnosis present

## 2017-12-13 DIAGNOSIS — Z6821 Body mass index (BMI) 21.0-21.9, adult: Secondary | ICD-10-CM | POA: Diagnosis not present

## 2017-12-13 DIAGNOSIS — F410 Panic disorder [episodic paroxysmal anxiety] without agoraphobia: Secondary | ICD-10-CM | POA: Diagnosis present

## 2017-12-13 DIAGNOSIS — E1142 Type 2 diabetes mellitus with diabetic polyneuropathy: Secondary | ICD-10-CM | POA: Diagnosis present

## 2017-12-13 DIAGNOSIS — Y832 Surgical operation with anastomosis, bypass or graft as the cause of abnormal reaction of the patient, or of later complication, without mention of misadventure at the time of the procedure: Secondary | ICD-10-CM | POA: Diagnosis present

## 2017-12-13 DIAGNOSIS — Z23 Encounter for immunization: Secondary | ICD-10-CM | POA: Diagnosis not present

## 2017-12-13 DIAGNOSIS — T82898A Other specified complication of vascular prosthetic devices, implants and grafts, initial encounter: Principal | ICD-10-CM | POA: Diagnosis present

## 2017-12-13 DIAGNOSIS — E1151 Type 2 diabetes mellitus with diabetic peripheral angiopathy without gangrene: Secondary | ICD-10-CM | POA: Diagnosis present

## 2017-12-13 DIAGNOSIS — G8918 Other acute postprocedural pain: Secondary | ICD-10-CM

## 2017-12-13 DIAGNOSIS — Z89411 Acquired absence of right great toe: Secondary | ICD-10-CM

## 2017-12-13 DIAGNOSIS — D62 Acute posthemorrhagic anemia: Secondary | ICD-10-CM | POA: Diagnosis not present

## 2017-12-13 DIAGNOSIS — Z720 Tobacco use: Secondary | ICD-10-CM

## 2017-12-13 DIAGNOSIS — Z794 Long term (current) use of insulin: Secondary | ICD-10-CM | POA: Diagnosis not present

## 2017-12-13 DIAGNOSIS — E1051 Type 1 diabetes mellitus with diabetic peripheral angiopathy without gangrene: Secondary | ICD-10-CM | POA: Diagnosis present

## 2017-12-13 DIAGNOSIS — F1010 Alcohol abuse, uncomplicated: Secondary | ICD-10-CM | POA: Diagnosis present

## 2017-12-13 DIAGNOSIS — F17210 Nicotine dependence, cigarettes, uncomplicated: Secondary | ICD-10-CM | POA: Diagnosis present

## 2017-12-13 DIAGNOSIS — Z7982 Long term (current) use of aspirin: Secondary | ICD-10-CM | POA: Diagnosis not present

## 2017-12-13 DIAGNOSIS — Z4781 Encounter for orthopedic aftercare following surgical amputation: Secondary | ICD-10-CM | POA: Diagnosis present

## 2017-12-13 DIAGNOSIS — I7025 Atherosclerosis of native arteries of other extremities with ulceration: Secondary | ICD-10-CM | POA: Diagnosis present

## 2017-12-13 DIAGNOSIS — I10 Essential (primary) hypertension: Secondary | ICD-10-CM

## 2017-12-13 DIAGNOSIS — S88111A Complete traumatic amputation at level between knee and ankle, right lower leg, initial encounter: Secondary | ICD-10-CM

## 2017-12-13 DIAGNOSIS — I739 Peripheral vascular disease, unspecified: Secondary | ICD-10-CM

## 2017-12-13 DIAGNOSIS — E1042 Type 1 diabetes mellitus with diabetic polyneuropathy: Secondary | ICD-10-CM | POA: Diagnosis present

## 2017-12-13 DIAGNOSIS — Z79899 Other long term (current) drug therapy: Secondary | ICD-10-CM | POA: Diagnosis not present

## 2017-12-13 DIAGNOSIS — G546 Phantom limb syndrome with pain: Secondary | ICD-10-CM | POA: Diagnosis present

## 2017-12-13 DIAGNOSIS — E871 Hypo-osmolality and hyponatremia: Secondary | ICD-10-CM | POA: Diagnosis present

## 2017-12-13 DIAGNOSIS — Z89511 Acquired absence of right leg below knee: Secondary | ICD-10-CM | POA: Diagnosis not present

## 2017-12-13 DIAGNOSIS — Z8249 Family history of ischemic heart disease and other diseases of the circulatory system: Secondary | ICD-10-CM | POA: Diagnosis not present

## 2017-12-13 DIAGNOSIS — D72829 Elevated white blood cell count, unspecified: Secondary | ICD-10-CM | POA: Diagnosis not present

## 2017-12-13 DIAGNOSIS — E8809 Other disorders of plasma-protein metabolism, not elsewhere classified: Secondary | ICD-10-CM | POA: Diagnosis present

## 2017-12-13 DIAGNOSIS — E1165 Type 2 diabetes mellitus with hyperglycemia: Secondary | ICD-10-CM | POA: Diagnosis present

## 2017-12-13 HISTORY — DX: Anemia, unspecified: D64.9

## 2017-12-13 HISTORY — PX: BELOW KNEE LEG AMPUTATION: SUR23

## 2017-12-13 HISTORY — PX: AMPUTATION: SHX166

## 2017-12-13 LAB — BASIC METABOLIC PANEL
ANION GAP: 12 (ref 5–15)
BUN: 7 mg/dL (ref 6–20)
CO2: 20 mmol/L — AB (ref 22–32)
Calcium: 8.1 mg/dL — ABNORMAL LOW (ref 8.9–10.3)
Chloride: 91 mmol/L — ABNORMAL LOW (ref 98–111)
Creatinine, Ser: 0.86 mg/dL (ref 0.61–1.24)
GFR calc non Af Amer: >60 mL/min (ref >60)
GLUCOSE: 157 mg/dL — AB (ref 70–99)
POTASSIUM: 3.6 mmol/L (ref 3.5–5.1)
Sodium: 123 mmol/L — ABNORMAL LOW (ref 135–145)

## 2017-12-13 LAB — CBC
HCT: 26.6 % — ABNORMAL LOW (ref 39.0–52.0)
Hemoglobin: 8.6 g/dL — ABNORMAL LOW (ref 13.0–17.0)
MCH: 30.5 pg (ref 26.0–34.0)
MCHC: 32.3 g/dL (ref 30.0–36.0)
MCV: 94.3 fL (ref 80.0–100.0)
Platelets: 343 10*3/uL (ref 150–400)
RBC: 2.82 MIL/uL — ABNORMAL LOW (ref 4.22–5.81)
RDW: 14.7 % (ref 11.5–15.5)
WBC: 11.7 10*3/uL — AB (ref 4.0–10.5)
nRBC: 0 % (ref 0.0–0.2)

## 2017-12-13 LAB — GLUCOSE, CAPILLARY
GLUCOSE-CAPILLARY: 162 mg/dL — AB (ref 70–99)
Glucose-Capillary: 151 mg/dL — ABNORMAL HIGH (ref 70–99)
Glucose-Capillary: 185 mg/dL — ABNORMAL HIGH (ref 70–99)
Glucose-Capillary: 250 mg/dL — ABNORMAL HIGH (ref 70–99)
Glucose-Capillary: 271 mg/dL — ABNORMAL HIGH (ref 70–99)

## 2017-12-13 LAB — HEMOGLOBIN A1C
Hgb A1c MFr Bld: 7.1 % — ABNORMAL HIGH (ref 4.8–5.6)
Mean Plasma Glucose: 157.07 mg/dL

## 2017-12-13 SURGERY — AMPUTATION BELOW KNEE
Anesthesia: General | Laterality: Right

## 2017-12-13 MED ORDER — MIDAZOLAM HCL 2 MG/2ML IJ SOLN
INTRAMUSCULAR | Status: DC | PRN
  Administered 2017-12-13: 2 mg via INTRAVENOUS

## 2017-12-13 MED ORDER — PHENOL 1.4 % MT LIQD: 1.0000 | OROMUCOSAL | Status: DC | PRN

## 2017-12-13 MED ORDER — MIDAZOLAM HCL 2 MG/2ML IJ SOLN
INTRAMUSCULAR | Status: AC
  Filled 2017-12-13: qty 2

## 2017-12-13 MED ORDER — PANTOPRAZOLE SODIUM 40 MG PO TBEC: 40.0000 mg | DELAYED_RELEASE_TABLET | Freq: Every day | ORAL | Status: DC | PRN

## 2017-12-13 MED ORDER — FENTANYL CITRATE (PF) 250 MCG/5ML IJ SOLN
INTRAMUSCULAR | Status: DC | PRN
  Administered 2017-12-13: 50 ug via INTRAVENOUS

## 2017-12-13 MED ORDER — PHENYLEPHRINE 40 MCG/ML (10ML) SYRINGE FOR IV PUSH (FOR BLOOD PRESSURE SUPPORT)
PREFILLED_SYRINGE | INTRAVENOUS | Status: AC
  Filled 2017-12-13: qty 20

## 2017-12-13 MED ORDER — LACTATED RINGERS IV SOLN
INTRAVENOUS | Status: DC | PRN
  Administered 2017-12-13: 08:00:00 via INTRAVENOUS

## 2017-12-13 MED ORDER — OXYCODONE HCL 5 MG PO TABS
5.0000 mg | ORAL_TABLET | Freq: Once | ORAL | Status: AC | PRN
  Administered 2017-12-13: 5 mg via ORAL

## 2017-12-13 MED ORDER — OMEPRAZOLE MAGNESIUM 20 MG PO TBEC: 20.0000 mg | DELAYED_RELEASE_TABLET | Freq: Every day | ORAL | Status: DC | PRN

## 2017-12-13 MED ORDER — PROPOFOL 10 MG/ML IV BOLUS
INTRAVENOUS | Status: AC
  Filled 2017-12-13: qty 20

## 2017-12-13 MED ORDER — TIOTROPIUM BROMIDE MONOHYDRATE 18 MCG IN CAPS: 18.0000 ug | ORAL_CAPSULE | Freq: Every day | RESPIRATORY_TRACT | Status: DC

## 2017-12-13 MED ORDER — ONDANSETRON HCL 4 MG/2ML IJ SOLN
INTRAMUSCULAR | Status: DC | PRN
  Administered 2017-12-13: 4 mg via INTRAVENOUS

## 2017-12-13 MED ORDER — DOCUSATE SODIUM 100 MG PO CAPS
100.0000 mg | ORAL_CAPSULE | Freq: Every day | ORAL | Status: DC
  Administered 2017-12-14 – 2017-12-15 (×2): 100 mg via ORAL
  Filled 2017-12-13 (×3): qty 1

## 2017-12-13 MED ORDER — INFLUENZA VAC SPLIT QUAD 0.5 ML IM SUSY
0.5000 mL | PREFILLED_SYRINGE | INTRAMUSCULAR | Status: AC
  Administered 2017-12-15: 0.5 mL via INTRAMUSCULAR
  Filled 2017-12-13: qty 0.5

## 2017-12-13 MED ORDER — OXYCODONE HCL 5 MG PO TABS
ORAL_TABLET | ORAL | Status: AC
  Filled 2017-12-13: qty 1

## 2017-12-13 MED ORDER — ROCURONIUM BROMIDE 50 MG/5ML IV SOSY
PREFILLED_SYRINGE | INTRAVENOUS | Status: AC
  Filled 2017-12-13: qty 5

## 2017-12-13 MED ORDER — LIDOCAINE 2% (20 MG/ML) 5 ML SYRINGE
INTRAMUSCULAR | Status: DC | PRN
  Administered 2017-12-13: 60 mg via INTRAVENOUS

## 2017-12-13 MED ORDER — CHLORHEXIDINE GLUCONATE CLOTH 2 % EX PADS: 6.0000 | MEDICATED_PAD | Freq: Once | CUTANEOUS | Status: DC

## 2017-12-13 MED ORDER — ONDANSETRON HCL 4 MG/2ML IJ SOLN
4.0000 mg | Freq: Four times a day (QID) | INTRAMUSCULAR | Status: DC | PRN
  Administered 2017-12-14 (×2): 4 mg via INTRAVENOUS
  Filled 2017-12-13: qty 2

## 2017-12-13 MED ORDER — OXYCODONE-ACETAMINOPHEN 5-325 MG PO TABS
1.0000 | ORAL_TABLET | ORAL | Status: DC | PRN
  Administered 2017-12-13 – 2017-12-15 (×7): 2 via ORAL
  Administered 2017-12-15: 1 via ORAL
  Filled 2017-12-13 (×6): qty 2
  Filled 2017-12-13: qty 1
  Filled 2017-12-13: qty 2

## 2017-12-13 MED ORDER — FENTANYL CITRATE (PF) 100 MCG/2ML IJ SOLN
INTRAMUSCULAR | Status: AC
  Filled 2017-12-13: qty 2

## 2017-12-13 MED ORDER — DEXAMETHASONE SODIUM PHOSPHATE 10 MG/ML IJ SOLN
INTRAMUSCULAR | Status: DC | PRN
  Administered 2017-12-13: 4 mg via INTRAVENOUS

## 2017-12-13 MED ORDER — ACETAMINOPHEN 325 MG PO TABS
325.0000 mg | ORAL_TABLET | ORAL | Status: DC | PRN
  Administered 2017-12-14: 650 mg via ORAL
  Filled 2017-12-13: qty 2

## 2017-12-13 MED ORDER — ROPIVACAINE HCL 5 MG/ML IJ SOLN
INTRAMUSCULAR | Status: DC | PRN
  Administered 2017-12-13: 10 mL via PERINEURAL
  Administered 2017-12-13: 20 mL via PERINEURAL

## 2017-12-13 MED ORDER — NALOXONE HCL 0.4 MG/ML IJ SOLN: 0.4000 mg | INTRAMUSCULAR | Status: DC | PRN

## 2017-12-13 MED ORDER — DEXAMETHASONE SODIUM PHOSPHATE 10 MG/ML IJ SOLN
INTRAMUSCULAR | Status: AC
  Filled 2017-12-13: qty 1

## 2017-12-13 MED ORDER — ACETAMINOPHEN 325 MG RE SUPP: 325.0000 mg | RECTAL | Status: DC | PRN

## 2017-12-13 MED ORDER — DIPHENHYDRAMINE HCL 12.5 MG/5ML PO ELIX: 12.5000 mg | ORAL_SOLUTION | Freq: Four times a day (QID) | ORAL | Status: DC | PRN

## 2017-12-13 MED ORDER — CEFAZOLIN SODIUM-DEXTROSE 2-4 GM/100ML-% IV SOLN
2.0000 g | Freq: Three times a day (TID) | INTRAVENOUS | Status: DC
  Administered 2017-12-13 – 2017-12-15 (×7): 2 g via INTRAVENOUS
  Filled 2017-12-13 (×7): qty 100

## 2017-12-13 MED ORDER — 0.9 % SODIUM CHLORIDE (POUR BTL) OPTIME
TOPICAL | Status: DC | PRN
  Administered 2017-12-13: 1000 mL

## 2017-12-13 MED ORDER — ASPIRIN 81 MG PO CHEW
81.0000 mg | CHEWABLE_TABLET | Freq: Every evening | ORAL | Status: DC
  Administered 2017-12-14: 81 mg via ORAL
  Filled 2017-12-13: qty 1

## 2017-12-13 MED ORDER — MELATONIN 3 MG PO TABS
6.0000 mg | ORAL_TABLET | Freq: Every evening | ORAL | Status: DC | PRN
  Administered 2017-12-15: 6 mg via ORAL
  Filled 2017-12-13 (×2): qty 2

## 2017-12-13 MED ORDER — GUAIFENESIN-DM 100-10 MG/5ML PO SYRP: 15.0000 mL | ORAL_SOLUTION | ORAL | Status: DC | PRN

## 2017-12-13 MED ORDER — HYDRALAZINE HCL 20 MG/ML IJ SOLN: 5.0000 mg | INTRAMUSCULAR | Status: DC | PRN

## 2017-12-13 MED ORDER — POLYETHYLENE GLYCOL 3350 17 G PO PACK: 17.0000 g | PACK | Freq: Every day | ORAL | Status: DC | PRN

## 2017-12-13 MED ORDER — CEFAZOLIN SODIUM-DEXTROSE 2-4 GM/100ML-% IV SOLN
2.0000 g | INTRAVENOUS | Status: AC
  Administered 2017-12-13: 2 g via INTRAVENOUS
  Filled 2017-12-13: qty 100

## 2017-12-13 MED ORDER — PROPOFOL 10 MG/ML IV BOLUS
INTRAVENOUS | Status: DC | PRN
  Administered 2017-12-13: 140 mg via INTRAVENOUS

## 2017-12-13 MED ORDER — MAGNESIUM SULFATE 2 GM/50ML IV SOLN: 2.0000 g | Freq: Every day | INTRAVENOUS | Status: DC | PRN

## 2017-12-13 MED ORDER — LIDOCAINE 2% (20 MG/ML) 5 ML SYRINGE
INTRAMUSCULAR | Status: AC
  Filled 2017-12-13: qty 5

## 2017-12-13 MED ORDER — FENTANYL CITRATE (PF) 100 MCG/2ML IJ SOLN: 25.0000 ug | INTRAMUSCULAR | Status: DC | PRN

## 2017-12-13 MED ORDER — SODIUM CHLORIDE 0.9 % IV SOLN
INTRAVENOUS | Status: DC | PRN
  Administered 2017-12-13: 40 ug/min via INTRAVENOUS

## 2017-12-13 MED ORDER — PANTOPRAZOLE SODIUM 40 MG PO TBEC
40.0000 mg | DELAYED_RELEASE_TABLET | Freq: Every day | ORAL | Status: DC
  Administered 2017-12-13 – 2017-12-15 (×3): 40 mg via ORAL
  Filled 2017-12-13 (×3): qty 1

## 2017-12-13 MED ORDER — LABETALOL HCL 5 MG/ML IV SOLN: 10.0000 mg | INTRAVENOUS | Status: DC | PRN

## 2017-12-13 MED ORDER — PHENYLEPHRINE 40 MCG/ML (10ML) SYRINGE FOR IV PUSH (FOR BLOOD PRESSURE SUPPORT)
PREFILLED_SYRINGE | INTRAVENOUS | Status: DC | PRN
  Administered 2017-12-13: 120 ug via INTRAVENOUS
  Administered 2017-12-13 (×2): 80 ug via INTRAVENOUS
  Administered 2017-12-13: 160 ug via INTRAVENOUS
  Administered 2017-12-13: 40 ug via INTRAVENOUS

## 2017-12-13 MED ORDER — LISINOPRIL 10 MG PO TABS
20.0000 mg | ORAL_TABLET | Freq: Every evening | ORAL | Status: DC
  Administered 2017-12-14: 20 mg via ORAL
  Filled 2017-12-13: qty 2

## 2017-12-13 MED ORDER — BISACODYL 10 MG RE SUPP: 10.0000 mg | Freq: Every day | RECTAL | Status: DC | PRN

## 2017-12-13 MED ORDER — UMECLIDINIUM BROMIDE 62.5 MCG/INH IN AEPB
1.0000 | INHALATION_SPRAY | Freq: Every day | RESPIRATORY_TRACT | Status: DC
  Administered 2017-12-14 – 2017-12-15 (×2): 1 via RESPIRATORY_TRACT
  Filled 2017-12-13: qty 7

## 2017-12-13 MED ORDER — OXYCODONE HCL 5 MG/5ML PO SOLN: 5.0000 mg | Freq: Once | ORAL | Status: AC | PRN

## 2017-12-13 MED ORDER — SODIUM CHLORIDE 0.9 % IV SOLN: INTRAVENOUS | Status: DC

## 2017-12-13 MED ORDER — ALPRAZOLAM 0.5 MG PO TABS: 1.0000 mg | ORAL_TABLET | Freq: Four times a day (QID) | ORAL | Status: DC | PRN

## 2017-12-13 MED ORDER — POTASSIUM CHLORIDE CRYS ER 20 MEQ PO TBCR: 20.0000 meq | EXTENDED_RELEASE_TABLET | Freq: Every day | ORAL | Status: DC | PRN

## 2017-12-13 MED ORDER — DIPHENHYDRAMINE HCL 50 MG/ML IJ SOLN: 12.5000 mg | Freq: Four times a day (QID) | INTRAMUSCULAR | Status: DC | PRN

## 2017-12-13 MED ORDER — EPHEDRINE 5 MG/ML INJ
INTRAVENOUS | Status: AC
  Filled 2017-12-13: qty 10

## 2017-12-13 MED ORDER — IMIPRAMINE HCL 50 MG PO TABS
50.0000 mg | ORAL_TABLET | Freq: Every day | ORAL | Status: DC
  Administered 2017-12-13 – 2017-12-14 (×2): 50 mg via ORAL
  Filled 2017-12-13 (×2): qty 1

## 2017-12-13 MED ORDER — SIMVASTATIN 20 MG PO TABS
20.0000 mg | ORAL_TABLET | Freq: Every day | ORAL | Status: DC
  Administered 2017-12-13 – 2017-12-14 (×2): 20 mg via ORAL
  Filled 2017-12-13 (×2): qty 1

## 2017-12-13 MED ORDER — ONDANSETRON HCL 4 MG/2ML IJ SOLN
4.0000 mg | Freq: Four times a day (QID) | INTRAMUSCULAR | Status: DC | PRN
  Filled 2017-12-13: qty 2

## 2017-12-13 MED ORDER — FENTANYL CITRATE (PF) 250 MCG/5ML IJ SOLN
INTRAMUSCULAR | Status: AC
  Filled 2017-12-13: qty 5

## 2017-12-13 MED ORDER — ONDANSETRON HCL 4 MG/2ML IJ SOLN: 4.0000 mg | Freq: Once | INTRAMUSCULAR | Status: DC | PRN

## 2017-12-13 MED ORDER — NICOTINE 21 MG/24HR TD PT24
21.0000 mg | MEDICATED_PATCH | Freq: Every day | TRANSDERMAL | Status: DC
  Filled 2017-12-13: qty 1

## 2017-12-13 MED ORDER — BACITRACIN ZINC 500 UNIT/GM EX OINT
TOPICAL_OINTMENT | CUTANEOUS | Status: AC
  Filled 2017-12-13: qty 28.35

## 2017-12-13 MED ORDER — MORPHINE SULFATE (PF) 2 MG/ML IV SOLN
2.0000 mg | INTRAVENOUS | Status: DC | PRN
  Administered 2017-12-13: 2 mg via INTRAVENOUS
  Filled 2017-12-13: qty 1

## 2017-12-13 MED ORDER — ALUM & MAG HYDROXIDE-SIMETH 200-200-20 MG/5ML PO SUSP: 15.0000 mL | ORAL | Status: DC | PRN

## 2017-12-13 MED ORDER — MORPHINE SULFATE 2 MG/ML IV SOLN
INTRAVENOUS | Status: DC
  Administered 2017-12-14 (×2): 22.5 mg via INTRAVENOUS
  Administered 2017-12-14: 07:00:00 via INTRAVENOUS
  Administered 2017-12-14: 2 mg via INTRAVENOUS
  Filled 2017-12-13 (×2): qty 25

## 2017-12-13 MED ORDER — SODIUM CHLORIDE 0.9% FLUSH: 9.0000 mL | INTRAVENOUS | Status: DC | PRN

## 2017-12-13 MED ORDER — INSULIN GLARGINE 100 UNIT/ML ~~LOC~~ SOLN
12.0000 [IU] | Freq: Every day | SUBCUTANEOUS | Status: DC
  Administered 2017-12-13 – 2017-12-14 (×2): 12 [IU] via SUBCUTANEOUS
  Filled 2017-12-13 (×3): qty 0.12

## 2017-12-13 MED ORDER — ONDANSETRON HCL 4 MG/2ML IJ SOLN
INTRAMUSCULAR | Status: AC
  Filled 2017-12-13: qty 2

## 2017-12-13 MED ORDER — METOPROLOL TARTRATE 5 MG/5ML IV SOLN: 2.0000 mg | INTRAVENOUS | Status: DC | PRN

## 2017-12-13 MED ORDER — HEPARIN SODIUM (PORCINE) 5000 UNIT/ML IJ SOLN
5000.0000 [IU] | Freq: Three times a day (TID) | INTRAMUSCULAR | Status: DC
  Administered 2017-12-14 – 2017-12-15 (×5): 5000 [IU] via SUBCUTANEOUS
  Filled 2017-12-13 (×5): qty 1

## 2017-12-13 MED ORDER — INSULIN ASPART 100 UNIT/ML ~~LOC~~ SOLN
0.0000 [IU] | Freq: Three times a day (TID) | SUBCUTANEOUS | Status: DC
  Administered 2017-12-13: 3 [IU] via SUBCUTANEOUS
  Administered 2017-12-14: 5 [IU] via SUBCUTANEOUS
  Administered 2017-12-15: 2 [IU] via SUBCUTANEOUS

## 2017-12-13 MED ORDER — SODIUM CHLORIDE 0.9 % IV SOLN
INTRAVENOUS | Status: DC
  Administered 2017-12-13: 75 mL/h via INTRAVENOUS
  Administered 2017-12-14 (×2): via INTRAVENOUS

## 2017-12-13 SURGICAL SUPPLY — 58 items
BANDAGE ACE 4X5 VEL STRL LF (GAUZE/BANDAGES/DRESSINGS) ×4 IMPLANT
BANDAGE ESMARK 6X9 LF (GAUZE/BANDAGES/DRESSINGS) ×1 IMPLANT
BLADE SAW RECIP 87.9 MT (BLADE) ×5 IMPLANT
BNDG CMPR 9X4 STRL LF SNTH (GAUZE/BANDAGES/DRESSINGS) ×1
BNDG CMPR 9X6 STRL LF SNTH (GAUZE/BANDAGES/DRESSINGS) ×1
BNDG COHESIVE 6X5 TAN STRL LF (GAUZE/BANDAGES/DRESSINGS) ×3 IMPLANT
BNDG ESMARK 4X9 LF (GAUZE/BANDAGES/DRESSINGS) ×2 IMPLANT
BNDG ESMARK 6X9 LF (GAUZE/BANDAGES/DRESSINGS) ×3
BNDG GAUZE ELAST 4 BULKY (GAUZE/BANDAGES/DRESSINGS) ×4 IMPLANT
CANISTER SUCT 3000ML PPV (MISCELLANEOUS) ×6 IMPLANT
CLIP VESOCCLUDE MED 6/CT (CLIP) ×2 IMPLANT
COVER SURGICAL LIGHT HANDLE (MISCELLANEOUS) ×3 IMPLANT
COVER WAND RF STERILE (DRAPES) ×3 IMPLANT
CUFF TOURNIQUET SINGLE 24IN (TOURNIQUET CUFF) IMPLANT
CUFF TOURNIQUET SINGLE 34IN LL (TOURNIQUET CUFF) ×2 IMPLANT
CUFF TOURNIQUET SINGLE 44IN (TOURNIQUET CUFF) IMPLANT
DRAIN CHANNEL 19F RND (DRAIN) IMPLANT
DRAPE HALF SHEET 40X57 (DRAPES) ×3 IMPLANT
DRAPE ORTHO SPLIT 77X108 STRL (DRAPES) ×6
DRAPE SURG ORHT 6 SPLT 77X108 (DRAPES) ×2 IMPLANT
DRAPE U-SHAPE 47X51 STRL (DRAPES) ×3 IMPLANT
DRSG ADAPTIC 3X8 NADH LF (GAUZE/BANDAGES/DRESSINGS) ×3 IMPLANT
ELECT REM PT RETURN 9FT ADLT (ELECTROSURGICAL) ×3
ELECTRODE REM PT RTRN 9FT ADLT (ELECTROSURGICAL) ×1 IMPLANT
EVACUATOR SILICONE 100CC (DRAIN) IMPLANT
GAUZE SPONGE 4X4 12PLY STRL (GAUZE/BANDAGES/DRESSINGS) ×6 IMPLANT
GAUZE SPONGE 4X4 12PLY STRL LF (GAUZE/BANDAGES/DRESSINGS) ×2 IMPLANT
GLOVE BIO SURGEON STRL SZ7.5 (GLOVE) ×3 IMPLANT
GLOVE BIOGEL M 6.5 STRL (GLOVE) ×6 IMPLANT
GLOVE BIOGEL PI IND STRL 6.5 (GLOVE) IMPLANT
GLOVE BIOGEL PI IND STRL 7.5 (GLOVE) IMPLANT
GLOVE BIOGEL PI IND STRL 8 (GLOVE) ×1 IMPLANT
GLOVE BIOGEL PI INDICATOR 6.5 (GLOVE) ×6
GLOVE BIOGEL PI INDICATOR 7.5 (GLOVE) ×2
GLOVE BIOGEL PI INDICATOR 8 (GLOVE) ×2
GLOVE ECLIPSE 6.5 STRL STRAW (GLOVE) ×2 IMPLANT
GOWN STRL REUS W/ TWL LRG LVL3 (GOWN DISPOSABLE) ×3 IMPLANT
GOWN STRL REUS W/TWL LRG LVL3 (GOWN DISPOSABLE) ×12
KIT BASIN OR (CUSTOM PROCEDURE TRAY) ×3 IMPLANT
KIT TURNOVER KIT B (KITS) ×3 IMPLANT
NS IRRIG 1000ML POUR BTL (IV SOLUTION) ×3 IMPLANT
PACK GENERAL/GYN (CUSTOM PROCEDURE TRAY) ×3 IMPLANT
PAD ARMBOARD 7.5X6 YLW CONV (MISCELLANEOUS) ×6 IMPLANT
SPONGE LAP 18X18 X RAY DECT (DISPOSABLE) ×2 IMPLANT
STAPLER VISISTAT (STAPLE) ×3 IMPLANT
STAPLER VISISTAT 35W (STAPLE) ×2 IMPLANT
STOCKINETTE IMPERVIOUS LG (DRAPES) ×3 IMPLANT
SUT ETHILON 3 0 PS 1 (SUTURE) IMPLANT
SUT SILK 0 TIES 10X30 (SUTURE) ×3 IMPLANT
SUT SILK 2 0 (SUTURE)
SUT SILK 2 0 SH CR/8 (SUTURE) ×5 IMPLANT
SUT SILK 2-0 18XBRD TIE 12 (SUTURE) ×1 IMPLANT
SUT SILK 3 0 (SUTURE)
SUT SILK 3-0 18XBRD TIE 12 (SUTURE) ×1 IMPLANT
SUT VIC AB 2-0 CT1 18 (SUTURE) ×5 IMPLANT
TOWEL GREEN STERILE (TOWEL DISPOSABLE) ×6 IMPLANT
UNDERPAD 30X30 (UNDERPADS AND DIAPERS) ×3 IMPLANT
WATER STERILE IRR 1000ML POUR (IV SOLUTION) ×3 IMPLANT

## 2017-12-13 NOTE — Op Note (Signed)
    NAME: Jay Barber    MRN: [id number] DOB: 07-06-1980    DATE OF OPERATION: 12/13/2017  PREOP DIAGNOSIS:    Nonhealing right leg wound with peripheral vascular disease  POSTOP DIAGNOSIS:    Same  PROCEDURE:    Right below the knee amputation  SURGEON: Di Kindle. Edilia Bo, MD, FACS  ASSIST: Doreatha Massed, PA  ANESTHESIA: General  EBL: 100 cc  INDICATIONS:    Jay Barber is a 37 y.o. male who presented with a nonhealing wound of the right leg.  He had had previous revascularization attempts that were unsuccessful.  Given the extent of the wound of the foot ultimately we felt the right below the knee amputation was passed.  FINDINGS:   The tissue contracted and appeared to have reasonable perfusion.  There were no gross signs of infection.  TECHNIQUE:   The patient was taken to the operating room and received a general anesthetic.  The right leg was prepped and draped in usual sterile fashion.  The circumference of the limb was measured 10 cm distal to the tibial tuberosity and two thirds at this distance was used to mark the anterior skin flap.  A long posterior flap of equal length was marked.  A tourniquet was placed on the thigh.  The leg was exsanguinated with an Esmarch bandage and the tourniquet inflated to 250 mmHg.  Under tourniquet control, the incision was carried down to the skin, subcutaneous tissue, fascia and muscle to the tibia and fibula which were dissected free circumferentially.  The periosteum was elevated.  The bone was divided proximal to the level of skin division.  The anterior aspect of the tibia was beveled.  The fibula was divided.  The arteries and veins were individually suture ligated with 2-0 silk ties.  Hemostasis was obtained in the wounds.  The tourniquet was released.  The edges of the bone were rasped.  The wound was irrigated.  The wound was then closed with interrupted 2-0 Vicryls and the skin closed with staples.  A sterile dressing  was applied.  The patient tolerated the procedure well and was transferred to the recovery room in stable condition.  All needle and sponge counts were correct.  Jay Ferrari, MD, FACS [facility]  DATE OF DICTATION:   12/13/2017

## 2017-12-13 NOTE — Anesthesia Procedure Notes (Signed)
Anesthesia Regional Block: Adductor canal block   Pre-Anesthetic Checklist: ,, timeout performed, Correct Patient, Correct Site, Correct Laterality, Correct Procedure, Correct Position, site marked, Risks and benefits discussed,  Surgical consent,  Pre-op evaluation,  At surgeon's request and post-op pain management  Laterality: Right  Prep: chloraprep       Needles:  Injection technique: Single-shot  Needle Type: Echogenic Needle     Needle Length: 10cm  Needle Gauge: 21     Additional Needles:   Narrative:  Start time: 12/13/2017 7:22 AM End time: 12/13/2017 7:26 AM Injection made incrementally with aspirations every 5 mL.  Performed by: Personally  Anesthesiologist: Beryle Lathe, MD  Additional Notes: No pain on injection. No increased resistance to injection. Injection made in 5cc increments. Good needle visualization. Patient tolerated the procedure well.

## 2017-12-13 NOTE — Transfer of Care (Signed)
Immediate Anesthesia Transfer of Care Note  Patient: Jay Barber  Procedure(s) Performed: AMPUTATION BELOW KNEE (Right )  Patient Location: PACU  Anesthesia Type:GA combined with regional for post-op pain  Level of Consciousness: awake, alert  and oriented  Airway & Oxygen Therapy: Patient Spontanous Breathing and Patient connected to nasal cannula oxygen  Post-op Assessment: Report given to RN and Post -op Vital signs reviewed and stable  Post vital signs: Reviewed and stable  Last Vitals:  Vitals Value Taken Time  BP 91/53 12/13/2017  9:16 AM  Temp    Pulse 82 12/13/2017  9:18 AM  Resp 22 12/13/2017  9:18 AM  SpO2 98 % 12/13/2017  9:18 AM  Vitals shown include unvalidated device data.  Last Pain:  Vitals:   12/13/17 0637  PainSc: 4       Patients Stated Pain Goal: 5 (12/13/17 1610)  Complications: No apparent anesthesia complications

## 2017-12-13 NOTE — Anesthesia Postprocedure Evaluation (Signed)
Anesthesia Post Note  Patient: Jay Barber  Procedure(s) Performed: AMPUTATION BELOW KNEE (Right )     Patient location during evaluation: PACU Anesthesia Type: General Level of consciousness: awake and alert Pain management: pain level controlled Vital Signs Assessment: post-procedure vital signs reviewed and stable Respiratory status: spontaneous breathing, nonlabored ventilation and respiratory function stable Cardiovascular status: blood pressure returned to baseline and stable Postop Assessment: no apparent nausea or vomiting Anesthetic complications: no    Last Vitals:  Vitals:   12/13/17 1000 12/13/17 1015  BP: (!) 92/57 (!) 96/59  Pulse: 83 84  Resp: 18 15  Temp:    SpO2: 100% 99%    Last Pain:  Vitals:   12/13/17 0930  PainSc: 4                  Beryle Lathe

## 2017-12-13 NOTE — Interval H&P Note (Signed)
History and Physical Interval Note:  12/13/2017 7:18 AM  Jay Barber  has presented today for surgery, with the diagnosis of nonviable tissue  The various methods of treatment have been discussed with the patient and family. After consideration of risks, benefits and other options for treatment, the patient has consented to  Procedure(s): AMPUTATION BELOW KNEE (Right) as a surgical intervention .  [name] history has been reviewed, patient examined, no change in status, stable for surgery.  I have reviewed the patient's chart and labs.  Questions were answered to the patient's satisfaction.     Waverly Ferrari

## 2017-12-13 NOTE — Anesthesia Procedure Notes (Signed)
Procedure Name: LMA Insertion Date/Time: 12/13/2017 7:47 AM Performed by: Hart Robinsons, CRNA Pre-anesthesia Checklist: Patient identified, Emergency Drugs available, Suction available and Patient being monitored Patient Re-evaluated:Patient Re-evaluated prior to induction Oxygen Delivery Method: Circle System Utilized Preoxygenation: Pre-oxygenation with 100% oxygen Induction Type: IV induction Ventilation: Mask ventilation without difficulty LMA: LMA inserted LMA Size: 4.0 Number of attempts: 1 Airway Equipment and Method: Bite block Placement Confirmation: positive ETCO2 Tube secured with: Tape Dental Injury: Teeth and Oropharynx as per pre-operative assessment

## 2017-12-13 NOTE — Anesthesia Procedure Notes (Signed)
Anesthesia Regional Block: Popliteal block   Pre-Anesthetic Checklist: ,, timeout performed, Correct Patient, Correct Site, Correct Laterality, Correct Procedure, Correct Position, site marked, Risks and benefits discussed,  Surgical consent,  Pre-op evaluation,  At surgeon's request and post-op pain management  Laterality: Right  Prep: chloraprep       Needles:  Injection technique: Single-shot  Needle Type: Echogenic Needle     Needle Length: 10cm  Needle Gauge: 21     Additional Needles:   Narrative:  Start time: 12/13/2017 7:26 AM End time: 12/13/2017 7:30 AM Injection made incrementally with aspirations every 5 mL.  Performed by: Personally  Anesthesiologist: Beryle Lathe, MD  Additional Notes: No pain on injection. No increased resistance to injection. Injection made in 5cc increments. Good needle visualization. Patient tolerated the procedure well.

## 2017-12-14 ENCOUNTER — Encounter (HOSPITAL_COMMUNITY): Payer: Self-pay | Admitting: Vascular Surgery

## 2017-12-14 ENCOUNTER — Telehealth: Payer: Self-pay | Admitting: Vascular Surgery

## 2017-12-14 DIAGNOSIS — G8918 Other acute postprocedural pain: Secondary | ICD-10-CM

## 2017-12-14 DIAGNOSIS — Z720 Tobacco use: Secondary | ICD-10-CM

## 2017-12-14 DIAGNOSIS — E1142 Type 2 diabetes mellitus with diabetic polyneuropathy: Secondary | ICD-10-CM

## 2017-12-14 DIAGNOSIS — I739 Peripheral vascular disease, unspecified: Secondary | ICD-10-CM

## 2017-12-14 DIAGNOSIS — I10 Essential (primary) hypertension: Secondary | ICD-10-CM

## 2017-12-14 DIAGNOSIS — D72829 Elevated white blood cell count, unspecified: Secondary | ICD-10-CM

## 2017-12-14 DIAGNOSIS — S88111A Complete traumatic amputation at level between knee and ankle, right lower leg, initial encounter: Secondary | ICD-10-CM

## 2017-12-14 DIAGNOSIS — D62 Acute posthemorrhagic anemia: Secondary | ICD-10-CM

## 2017-12-14 LAB — CBC
HCT: 24.8 % — ABNORMAL LOW (ref 39.0–52.0)
Hemoglobin: 8.3 g/dL — ABNORMAL LOW (ref 13.0–17.0)
MCH: 30.7 pg (ref 26.0–34.0)
MCHC: 33.5 g/dL (ref 30.0–36.0)
MCV: 91.9 fL (ref 80.0–100.0)
NRBC: 0 % (ref 0.0–0.2)
PLATELETS: 374 10*3/uL (ref 150–400)
RBC: 2.7 MIL/uL — ABNORMAL LOW (ref 4.22–5.81)
RDW: 14.6 % (ref 11.5–15.5)
WBC: 17.8 10*3/uL — AB (ref 4.0–10.5)

## 2017-12-14 LAB — BASIC METABOLIC PANEL
Anion gap: 11 (ref 5–15)
BUN: 19 mg/dL (ref 6–20)
CO2: 20 mmol/L — ABNORMAL LOW (ref 22–32)
CREATININE: 1.43 mg/dL — AB (ref 0.61–1.24)
Calcium: 7.5 mg/dL — ABNORMAL LOW (ref 8.9–10.3)
Chloride: 92 mmol/L — ABNORMAL LOW (ref 98–111)
GFR calc non Af Amer: >60 mL/min (ref >60)
Glucose, Bld: 287 mg/dL — ABNORMAL HIGH (ref 70–99)
POTASSIUM: 4.2 mmol/L (ref 3.5–5.1)
SODIUM: 123 mmol/L — AB (ref 135–145)

## 2017-12-14 LAB — GLUCOSE, CAPILLARY
GLUCOSE-CAPILLARY: 267 mg/dL — AB (ref 70–99)
GLUCOSE-CAPILLARY: 105 mg/dL — AB (ref 70–99)
GLUCOSE-CAPILLARY: 107 mg/dL — AB (ref 70–99)
Glucose-Capillary: 67 mg/dL — ABNORMAL LOW (ref 70–99)
Glucose-Capillary: 74 mg/dL (ref 70–99)
Glucose-Capillary: 155 mg/dL — ABNORMAL HIGH (ref 70–99)

## 2017-12-14 MED ORDER — MORPHINE SULFATE (PF) 4 MG/ML IV SOLN
4.0000 mg | INTRAVENOUS | Status: DC | PRN
  Administered 2017-12-14 – 2017-12-15 (×3): 4 mg via INTRAVENOUS
  Filled 2017-12-14 (×3): qty 1

## 2017-12-14 MED ORDER — PROMETHAZINE HCL 25 MG/ML IJ SOLN
12.5000 mg | INTRAMUSCULAR | Status: DC | PRN
  Administered 2017-12-14: 12.5 mg via INTRAVENOUS
  Filled 2017-12-14: qty 1

## 2017-12-14 NOTE — Progress Notes (Signed)
Pt c/o pain 10/10 at surgical site after all prns given for pain. Called MD on call. New orders given. Will continue to follow.

## 2017-12-14 NOTE — Telephone Encounter (Signed)
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  F/u with Dr. Edilia Bo in 4 weeks.  Thanks

## 2017-12-14 NOTE — Progress Notes (Signed)
Pt having n/v since 7 am. Spoke with Dr Haskell Riling. She advises d/c PCA pump. Will manage pain with MS and Percocet. Lacy Duverney, RN

## 2017-12-14 NOTE — Telephone Encounter (Signed)
sch appt lvm 01/11/18 2pm staple removal

## 2017-12-14 NOTE — Progress Notes (Signed)
Wasted MS from PCA 24mg  in sharps. Witness by Alfredo Bach RN. Lacy Duverney, RN

## 2017-12-14 NOTE — Evaluation (Signed)
Occupational Therapy Evaluation Patient Details Name: Jay Barber MRN: [id number] DOB: [birth year] Today's Date: 12/14/2017    History of Present Illness Pt is a 37 y.o. male who presented with a nonhealing wound on right foot and is now s/p right below knee amputation.    Clinical Impression   Pt was admitted for the above.  Up until 2 weeks ago, he was independent with all adls.  He needed his wife's assistance recently for LB adls and installed 2 grab bars by his standard commode. Educated on precautions/safety and issued level 2 theraband for HEP strengthening program. Will follow in acute setting with supervision to min guard level goals.    Follow Up Recommendations  CIR    Equipment Recommendations  (may need 3:1; has tub seat, ? tub bench)    Recommendations for Other Services       Precautions / Restrictions Precautions Precautions: Fall Restrictions Weight Bearing Restrictions: Yes RLE Weight Bearing: Non weight bearing Other Position/Activity Restrictions: right BKA      Mobility Bed Mobility Overal bed mobility: Needs Assistance Bed Mobility: Supine to Sit     Supine to sit: Supervision     General bed mobility comments: oob by PT  Transfers BY PT: declined standing  with OT Overall transfer level: Needs assistance Equipment used: Rolling walker (2 wheeled) Transfers: Sit to/from [employer] Sit to Stand: Min assist;Mod assist Stand pivot transfers: Min assist           Balance Overall balance assessment: Needs assistance Sitting-balance support: No upper extremity supported;Feet unsupported Sitting balance-Leahy Scale: Good     Standing balance support: Bilateral upper extremity supported Standing balance-Leahy Scale: Poor                             ADL either performed or assessed with clinical judgement   ADL Overall ADL's : Needs assistance/impaired Eating/Feeding: Independent   Grooming: Set  up;Sitting   Upper Body Bathing: Set up;Sitting   Lower Body Bathing: Moderate assistance;Sitting/lateral leans   Upper Body Dressing : Minimal assistance;Sitting(lines)   Lower Body Dressing: Moderate assistance;Sitting/lateral leans       Toileting- Clothing Manipulation and Hygiene: Moderate assistance;Sitting/lateral lean         General ADL Comments: pt declined standing; able to laterally lean to bil sides. Educated on safety with forward leans     Vision         Perception     Praxis      Pertinent Vitals/Pain Pain Assessment: 0-10 Pain Score: 6  Pain Location: right residual limb Pain Descriptors / Indicators: Operative site guarding;Grimacing;Discomfort Pain Intervention(s): Limited activity within patient's tolerance;Monitored during session;Premedicated before session     Hand Dominance Left   Extremity/Trunk Assessment Upper Extremity Assessment Upper Extremity Assessment: Generalized weakness(4-/5)      Cervical / Trunk Assessment Cervical / Trunk Assessment: Normal   Communication Communication Communication: No difficulties   Cognition Arousal/Alertness: Awake/alert Behavior During Therapy: WFL for tasks assessed/performed Overall Cognitive Status: Within Functional Limits for tasks assessed                                     General Comments  pt sitting in chair with leg rest down; reinforced keeping limb straight    Exercises  Other Exercises Other Exercises: issued level 2 theraband. Worked on horizontal abd, FF and bicps  Shoulder Instructions      Home Living Family/patient expects to be discharged to:: Private residence Living Arrangements: Spouse/significant other;Children Available Help at Discharge: Family;Available 24 hours/day Type of Home: House Home Access: Stairs to enter [employer] of Steps: 3   Home Layout: One level     Bathroom Shower/Tub: Tub/shower unit;Walk-in shower    Bathroom Toilet: Standard     Home Equipment: Bedside commode;Cane - single point;Grab bars - toilet   Additional Comments: put grab bars by toilet (mounted).  Tub seat is sturdy but not a bench.  Got tub seat in '14, he thinks      Prior Functioning/Environment Level of Independence: Needs assistance    ADL's / Homemaking Assistance Needed: wife had to help with adls over past 2 weeks.  LLE swollen            OT Problem List: Decreased strength;Decreased activity tolerance;Impaired balance (sitting and/or standing);Pain;Decreased knowledge of use of DME or AE;Decreased knowledge of precautions      OT Treatment/Interventions: Self-care/ADL training;DME and/or AE instruction;Energy conservation;Therapeutic exercise;Therapeutic activities;Patient/family education;Balance training    OT Goals(Current goals can be found in the care plan section) Acute Rehab OT Goals Patient Stated Goal: "get stronger." OT Goal Formulation: With patient Time For Goal Achievement: 12/28/17 Potential to Achieve Goals: Good ADL Goals Pt Will Transfer to Toilet: with min guard assist;ambulating;stand pivot transfer;bedside commode(vs regular commode with grab bars) Pt Will Perform Tub/Shower Transfer: Tub transfer;ambulating;tub bench;rolling walker;with min guard assist Pt/caregiver will Perform Home Exercise Program: Increased strength;Both right and left upper extremity;Independently;With theraband(level 2) Additional ADL Goal #1: pt will perform adl with ae, sit to stand at supervision level and perform hygiene at this level  OT Frequency: Min 2X/week   Barriers to D/C:            Co-evaluation              AM-PAC PT "6 Clicks" Daily Activity     Outcome Measure Help from another person eating meals?: None Help from another person taking care of personal grooming?: A Little Help from another person toileting, which includes using toliet, bedpan, or urinal?: A Lot Help from another  person bathing (including washing, rinsing, drying)?: A Lot Help from another person to put on and taking off regular upper body clothing?: A Little Help from another person to put on and taking off regular lower body clothing?: A Lot 6 Click Score: 16   End of Session    Activity Tolerance: Patient limited by pain Patient left: in chair;with call bell/phone within reach  OT Visit Diagnosis: Muscle weakness (generalized) (M62.81)                Time: 1610-9604 and 1545 - 1550 OT Time Calculation (min): 17 min Charges:  OT General Charges $OT Visit: 1 Visit OT Evaluation $OT Eval Moderate Complexity: 1 Mod  Marica Otter, OTR/L Acute Rehabilitation Services [phone] [facility] pager [phone] office 12/14/2017  Kamy Poinsett 12/14/2017, 3:48 PM

## 2017-12-14 NOTE — Progress Notes (Signed)
[facility]  Patient Status: Active (receiving services up to time of hospitalization)  [facility] is providing the following services: RN  If patient discharges after hours, please call [phone].   Jay Barber 12/14/2017, 9:48 AM

## 2017-12-14 NOTE — Progress Notes (Signed)
Spoke with Dr Cory Roughen ref to n/v. Verbal order for 12.5 mg phenergan q4 prn. Will continue to monitor pt. Lacy Duverney, RN

## 2017-12-14 NOTE — Progress Notes (Signed)
Inpatient Rehabilitation Admissions Coordinator  I await therapy evals to assist with planning dispo options.  Ottie Glazier, RN, MSN Rehab Admissions Coordinator [phone] 12/14/2017 12:41 PM

## 2017-12-14 NOTE — Progress Notes (Signed)
   VASCULAR SURGERY ASSESSMENT & PLAN:   1 Day Post-Op s/p: Right BKA.  Given that he was having significant pain I did change his dressing this morning and his wound looks fine.  There is no erythema or drainage.  Begin daily dressing changes tomorrow.  He is being evaluated for CIR.   SUBJECTIVE:   Pain under better control.  He is using his PCA.  PHYSICAL EXAM:   Vitals:   12/14/17 0322 12/14/17 0400 12/14/17 0521 12/14/17 0654  BP:  122/70    Pulse:      Resp: 16 16 18 13   Temp:   98.3 F (36.8 C)   TempSrc:   Oral   SpO2: 94%  94% 94%   Right BKA inspected and looks fine.  Redressed.  LABS:   Lab Results  Component Value Date   WBC 17.8 (H) 12/14/2017   HGB 8.3 (L) 12/14/2017   HCT 24.8 (L) 12/14/2017   MCV 91.9 12/14/2017   PLT 374 12/14/2017   Lab Results  Component Value Date   CREATININE 1.43 (H) 12/14/2017   Lab Results  Component Value Date   INR 0.91 08/02/2017   CBG (last 3)  Recent Labs    12/13/17 1615 12/13/17 2125 12/14/17 0616  GLUCAP 250* 271* 267*    PROBLEM LIST:    Active Problems:   PAD (peripheral artery disease) (HCC)   CURRENT MEDS:   . aspirin  81 mg Oral QPM  . docusate sodium  100 mg Oral Daily  . heparin  5,000 Units Subcutaneous Q8H  . imipramine  50 mg Oral QHS  . Influenza vac split quadrivalent PF  0.5 mL Intramuscular Tomorrow-1000  . insulin aspart  0-9 Units Subcutaneous TID WC  . insulin glargine  12 Units Subcutaneous QHS  . lisinopril  20 mg Oral QPM  . morphine   Intravenous Q4H  . nicotine  21 mg Transdermal Daily  . pantoprazole  40 mg Oral Daily  . simvastatin  20 mg Oral q1800  . umeclidinium bromide  1 puff Inhalation Daily    Waverly Ferrari Beeper: [phone] Office: [phone] 12/14/2017

## 2017-12-14 NOTE — Progress Notes (Signed)
Inpatient Diabetes Program Recommendations  AACE/ADA: New Consensus Statement on Inpatient Glycemic Control (2015)  Target Ranges:  Prepandial:   less than 140 mg/dL      Peak postprandial:   less than 180 mg/dL (1-2 hours)      Critically ill patients:  140 - 180 mg/dL   Results for Jay Barber, Jay Barber (MRN [id number]) as of 12/14/2017 11:34  Ref. Range 12/13/2017 06:28 12/13/2017 09:18 12/13/2017 14:19 12/13/2017 16:15 12/13/2017 21:25 12/14/2017 06:16 12/14/2017 11:24  Glucose-Capillary Latest Ref Range: 70 - 99 mg/dL 782 (H) 956 (H) 213 (H) 250 (H) 271 (H) 267 (H) 105 (H)   Review of Glycemic Control  Diabetes history: DM 1 Outpatient Diabetes medications: Lantus 12 units, Novolog 2-4 units tid with meals Current orders for Inpatient glycemic control: Lantus 12 units qhs, Novolog 0-9 units tid  A1c 7.1% on 10/15  Inpatient Diabetes Program Recommendations:    Decadron 4 mg given yesterday during surgery. Glucose trends elevated yesterday. Fasting glucose back down to 105 at lunchtime today. Will follow trends for now as they should decrease on current regimen. Patient may need meal coverage if glucose tends to increase after meals.   Thanks,  Christena Deem RN, MSN, BC-ADM Inpatient Diabetes Coordinator Team Pager [phone] (8a-5p)

## 2017-12-14 NOTE — Evaluation (Addendum)
Physical Therapy Evaluation Patient Details Name: Jay Barber MRN: [id number] DOB: 31-Mar-1980 Today's Date: 12/14/2017   History of Present Illness  Pt is a 37 y.o. M who presented with a nonhealing wound on right foot and is now s/p right below knee amputation.   Clinical Impression  Pt admitted with above diagnosis. Pt currently with functional limitations due to the deficits listed below (see PT Problem List). Prior to admission, patient was independent, but reports he has gotten progressively weaker/more debilitated since June. On PT evaluation, patient presenting with decreased functional mobility secondary to right limb pain, generalized weakness, and balance impairments. Requiring up to moderate assistance for standing and transferring from bed to chair with walker. Limited by nausea, however, displays great motivation and participation in therapy. Educated patient on prosthetic expectations, desensitization techniques, and residual limb exercises. Highly recommending CIR to maximize functional independence and decrease caregiver burden. Suspect patient will progress well based on age, motivation, PLOF, and family support. Will benefit from further transfer, gait training, and strengthening exercises acutely to progress mobility.     Follow Up Recommendations CIR    Equipment Recommendations  Rolling walker with 5" wheels;Wheelchair (with right amputee limb support instead of right legrest)    Recommendations for Other Services Rehab consult     Precautions / Restrictions Precautions Precautions: Fall Restrictions Weight Bearing Restrictions: Yes RLE Weight Bearing: Non weight bearing Other Position/Activity Restrictions: right BKA      Mobility  Bed Mobility Overal bed mobility: Needs Assistance Bed Mobility: Supine to Sit     Supine to sit: Supervision        Transfers Overall transfer level: Needs assistance Equipment used: Rolling walker (2  wheeled) Transfers: Sit to/from [employer] Sit to Stand: Min assist;Mod assist Stand pivot transfers: Min assist       General transfer comment: Patient requiring min-mod assist from elevated bed surface to steady with cues for hand positioning. Min assist provided for stand pivot with cues for sequencing. Patient with decreased ability to hop on LLE.    Ambulation/Gait                Stairs            Wheelchair Mobility    Modified Rankin (Stroke Patients Only)       Balance Overall balance assessment: Needs assistance Sitting-balance support: No upper extremity supported;Feet unsupported Sitting balance-Leahy Scale: Good     Standing balance support: Bilateral upper extremity supported Standing balance-Leahy Scale: Poor                               Pertinent Vitals/Pain Pain Assessment: 0-10 Pain Score: 4  Pain Location: right residual limb Pain Descriptors / Indicators: Operative site guarding;Grimacing;Discomfort Pain Intervention(s): Monitored during session    Home Living Family/patient expects to be discharged to:: Private residence Living Arrangements: Spouse/significant other;Children Available Help at Discharge: Family;Available 24 hours/day Type of Home: House Home Access: Stairs to enter   [employer] of Steps: 3 Home Layout: One level Home Equipment: Bedside commode;Cane - single point      Prior Function Level of Independence: Independent               Hand Dominance   Dominant Hand: Left    Extremity/Trunk Assessment   Upper Extremity Assessment Upper Extremity Assessment: Defer to OT evaluation    Lower Extremity Assessment Lower Extremity Assessment: RLE deficits/detail RLE Deficits / Details:  s/p R BKA. At least anti gravity throughout. Able to perform SLR, hip abduction, and quad set    Cervical / Trunk Assessment Cervical / Trunk Assessment: Normal  Communication    Communication: No difficulties  Cognition Arousal/Alertness: Awake/alert Behavior During Therapy: WFL for tasks assessed/performed Overall Cognitive Status: Within Functional Limits for tasks assessed                                        General Comments      Exercises Amputee Exercises Quad Sets: 10 reps;Right;Supine Hip ABduction/ADduction: 10 reps;Right;Supine Straight Leg Raises: 5 reps;Right;Supine   Assessment/Plan    PT Assessment Patient needs continued PT services  PT Problem List Decreased strength;Decreased range of motion;Decreased activity tolerance;Decreased mobility;Decreased balance;Pain       PT Treatment Interventions DME instruction;Gait training;Stair training;Functional mobility training;Therapeutic activities;Therapeutic exercise;Balance training;Patient/family education;Wheelchair mobility training    PT Goals (Current goals can be found in the Care Plan section)  Acute Rehab PT Goals Patient Stated Goal: "get stronger." PT Goal Formulation: With patient Time For Goal Achievement: 12/28/17 Potential to Achieve Goals: Good    Frequency Min 4X/week   Barriers to discharge        Co-evaluation               AM-PAC PT "6 Clicks" Daily Activity  Outcome Measure Difficulty turning over in bed (including adjusting bedclothes, sheets and blankets)?: None Difficulty moving from lying on back to sitting on the side of the bed? : None Difficulty sitting down on and standing up from a chair with arms (e.g., wheelchair, bedside commode, etc,.)?: Unable Help needed moving to and from a bed to chair (including a wheelchair)?: A Little Help needed walking in hospital room?: A Lot Help needed climbing 3-5 steps with a railing? : Total 6 Click Score: 15    End of Session Equipment Utilized During Treatment: Gait belt;Oxygen Activity Tolerance: Other (comment)(limited by nausea) Patient left: in chair;with call bell/phone within  reach;with nursing/sitter in room Nurse Communication: Mobility status PT Visit Diagnosis: Unsteadiness on feet (R26.81);Other abnormalities of gait and mobility (R26.89);Difficulty in walking, not elsewhere classified (R26.2);Pain Pain - Right/Left: Right Pain - part of body: (residual limb)    Time: 9528-4132 PT Time Calculation (min) (ACUTE ONLY): 36 min   Charges:   PT Evaluation $PT Eval Moderate Complexity: 1 Mod PT Treatments $Therapeutic Exercise: 8-22 mins       Laurina Bustle, PT, DPT Acute Rehabilitation Services Pager [phone] Office [phone]   Vanetta Mulders 12/14/2017, 1:31 PM

## 2017-12-14 NOTE — Progress Notes (Signed)
OT Cancellation Note  Patient Details Name: LUDIE PAVLIK MRN: [id number] DOB: [birth year]   Cancelled Treatment:    Reason Eval/Treat Not Completed: Other (comment). Pt up in chair and continues to feel nauseous. Will try to check back later  [facility] 12/14/2017, 1:55 PM  Marica Otter, OTR/L Acute Rehabilitation Services [phone] [facility] pager [phone] office 12/14/2017

## 2017-12-14 NOTE — Consult Note (Addendum)
Physical Medicine and Rehabilitation Consult Reason for Consult: Decreased functional mobility Referring Physician: Dr. Waverly Ferrari   HPI: Jay Barber is a 37 y.o. right-handed male with history of tobacco abuse, diabetes mellitus with peripheral neuropathy, hypertension, peripheral vascular disease with history of right femoral-popliteal bypass grafting June 2019 and history of multiple right toe amputations.  Per chart review, patient, and nursing, patient lives with wife and 2 teenage daughters.  One level home 3 steps to entry.  Wife can assist as needed.  Jay Barber does have family in the area.  Patient independent with a knee scooter prior to admission.  Presented 12/13/2017 with nonhealing right leg wound and occlusion of femoral-popliteal bypass graft.  Limb was not felt to be salvageable.  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 Edilia Bo.  Hospital course pain management.  Acute blood loss anemia 8.3, leukocytosis 17,800.  Subcutaneous heparin for DVT prophylaxis.  Therapy evaluations pending.  MD has requested physical medicine rehab consult.   Review of Systems  Constitutional: Negative for chills and fever.  HENT: Negative for hearing loss.   Eyes: Negative for blurred vision and double vision.  Respiratory: Positive for cough.   Cardiovascular: Positive for leg swelling. Negative for chest pain and palpitations.  Gastrointestinal: Positive for constipation. Negative for nausea and vomiting.       GERD  Genitourinary: Negative for dysuria and hematuria.       Nocturia  Musculoskeletal: Positive for back pain and myalgias.  Skin: Negative for rash.  Psychiatric/Behavioral: Positive for depression.       Panic attacks  All other systems reviewed and are negative.  Past Medical History:  Diagnosis Date  . Anemia   . Depression   . Diabetes mellitus without complication (HCC)    takes Lantus and Novolog Type I  . Drainage from wound    right big toe  . Family  history of adverse reaction to anesthesia    mother had PONV- 09/27/17 - "years ago"  . Full dentures   . GERD (gastroesophageal reflux disease)   . History of bronchitis    only had once in Jay Barber life-3+yrs ago  . Hypertension   . Neuropathy associated with endocrine disorder (HCC)   . Nocturia   . Panic attacks    hx of but no meds required  . Peripheral vascular disease (HCC)   . Wears contact lenses    Past Surgical History:  Procedure Laterality Date  . ABDOMINAL AORTAGRAM N/A 11/06/2012   Procedure: ABDOMINAL Ronny Flurry;  Surgeon: Chuck Hint, MD;  Location: [facility] CATH LAB;  Service: Cardiovascular;  Laterality: N/A;  . ABDOMINAL AORTOGRAM N/A 06/03/2017   Procedure: ABDOMINAL AORTOGRAM;  Surgeon: Chuck Hint, MD;  Location: [facility] INVASIVE CV LAB;  Service: Cardiovascular;  Laterality: N/A;  . ABDOMINAL AORTOGRAM W/LOWER EXTREMITY N/A 12/02/2017   Procedure: ABDOMINAL AORTOGRAM W/LOWER EXTREMITY;  Surgeon: Chuck Hint, MD;  Location: [facility] INVASIVE CV LAB;  Service: Cardiovascular;  Laterality: N/A;  . AMPUTATION Right 05/11/2013   Procedure: AMPUTATION DIGIT;  Surgeon: Nadara Mustard, MD;  Location: [facility] OR;  Service: Orthopedics;  Laterality: Right;  Right Great Toe Amputation at MTP  . AMPUTATION Right 09/28/2017   Procedure: AMPUTATION RAY 5TH TOE POSSIBLE 4TH TOE;  Surgeon: Larina Earthly, MD;  Location: [facility] OR;  Service: Vascular;  Laterality: Right;  . APPLICATION OF WOUND VAC Right 09/28/2017   Procedure: APPLICATION OF WOUND VAC - RIGHT FOOT;  Surgeon: Larina Earthly, MD;  Location: [facility] OR;  Service: Vascular;  Laterality: Right;  . BELOW KNEE LEG AMPUTATION Right 12/13/2017  . ESOPHAGOGASTRODUODENOSCOPY    . FEMORAL ARTERY - POPLITEAL ARTERY BYPASS GRAFT  08/09/2017  . FEMORAL-POPLITEAL BYPASS GRAFT Right 08/09/2017   Procedure: BYPASS GRAFT FEMORAL-BELOW KNEE POPLITEAL ARTERY USING X 80CM GORE PROPATEN VASCULAR GRAFT;  Surgeon: Chuck Hint, MD;   Location: [facility] OR;  Service: Vascular;  Laterality: Right;  . INTRAOPERATIVE ARTERIOGRAM Right 08/09/2017   Procedure: INTRA OPERATIVE ARTERIOGRAM RIGHT LOWER LEG;  Surgeon: Chuck Hint, MD;  Location: [facility] OR;  Service: Vascular;  Laterality: Right;  . LOWER EXTREMITY ANGIOGRAPHY Bilateral 06/03/2017   Procedure: Lower Extremity Angiography;  Surgeon: Chuck Hint, MD;  Location: [facility] INVASIVE CV LAB;  Service: Cardiovascular;  Laterality: Bilateral;  . MOUTH SURGERY    . MULTIPLE TOOTH EXTRACTIONS     Family History  Problem Relation Age of Onset  . Heart disease Father        before age 3  . Hypertension Mother   . Depression Mother   . GER disease Mother    Social History:  reports that Jay Barber has been smoking cigarettes. Jay Barber has a 14.00 pack-year smoking history. Jay Barber has never used smokeless tobacco. Jay Barber reports that Jay Barber drinks alcohol. Jay Barber reports that Jay Barber does not use drugs. Allergies:  Allergies  Allergen Reactions  . Bupropion     Excessive fatigue    Medications Prior to Admission  Medication Sig Dispense Refill  . ALPRAZolam (XANAX) 1 MG tablet Take 1 mg by mouth 4 (four) times daily as needed for anxiety.     Marland Kitchen aspirin 81 MG tablet Take 81 mg by mouth every evening.     . Dakins (HYSEPT) 0.25 % SOLN Apply 1 application topically 2 (two) times daily.  1  . ibuprofen (ADVIL,MOTRIN) 200 MG tablet Take 400 mg by mouth every 6 (six) hours as needed.    Marland Kitchen imipramine (TOFRANIL) 25 MG tablet Take 50 mg by mouth at bedtime.     . insulin aspart (NOVOLOG) 100 UNIT/ML injection Inject 2-4 Units into the skin 3 (three) times daily with meals. Per sliding scale    . insulin glargine (LANTUS) 100 UNIT/ML injection Inject 12 Units into the skin at bedtime.     Marland Kitchen lisinopril (PRINIVIL,ZESTRIL) 20 MG tablet Take 20 mg by mouth every evening.   5  . Melatonin 3 MG CAPS Take 6 mg by mouth at bedtime as needed (sleep).    . Multiple Vitamins-Minerals (ALIVE MENS ENERGY PO) Take 1 tablet  by mouth daily.    Marland Kitchen omeprazole (PRILOSEC OTC) 20 MG tablet Take 20 mg by mouth daily as needed (for acid reflux).    . simvastatin (ZOCOR) 20 MG tablet Take 1 tablet (20 mg total) by mouth daily at 6 PM. 30 tablet 5  . sulfamethoxazole-trimethoprim (BACTRIM DS,SEPTRA DS) 800-160 MG tablet Take 1 tablet by mouth 2 (two) times daily.    Marland Kitchen tiotropium (SPIRIVA) 18 MCG inhalation capsule Place 18 mcg into inhaler and inhale daily.    Marland Kitchen NICOTINE STEP 1 21 MG/24HR patch Place 21 mg onto the skin daily.   0    Home: Home Living Family/patient expects to be discharged to:: Unsure Living Arrangements: Spouse/significant other, Children  Functional History:   Functional Status:  Mobility:          ADL:    Cognition: Cognition Orientation Level: Oriented X4    Blood pressure 122/70, pulse 88, temperature 98.3  F (36.8 C), temperature source Oral, resp. rate 18, SpO2 94 %. Physical Exam  Vitals reviewed. Constitutional: Jay Barber is oriented to person, place, and time. Jay Barber appears well-developed and well-nourished.  HENT:  Head: Normocephalic and atraumatic.  Eyes: EOM are normal. Right eye exhibits no discharge. Left eye exhibits no discharge.  Neck: Normal range of motion. Neck supple. No thyromegaly present.  Cardiovascular: Normal rate and regular rhythm.  Respiratory: Effort normal and breath sounds normal. No respiratory distress.  +Clymer  GI: Soft. Bowel sounds are normal. Jay Barber exhibits no distension.  Musculoskeletal:  RLE edema and tenderness  Neurological: Jay Barber is alert and oriented to person, place, and time.  Motor: B/ UE 5/5 proximal to distal LLE: 4/5 proximal to distal (some pain inhibition) RLE: limited by pain Sensation diminished to light touch left foot  Skin:  Amputation site dressed.  Clean and dry.  Appropriately tender  Psychiatric: Jay Barber speech is normal. Jay Barber mood appears anxious.    Results for orders placed or performed during the hospital encounter of 12/13/17  (from the past 24 hour(s))  Hemoglobin A1c     Status: Abnormal   Collection Time: 12/13/17  6:23 AM  Result Value Ref Range   Hgb A1c MFr Bld 7.1 (H) 4.8 - 5.6 %   Mean Plasma Glucose 157.07 mg/dL  Glucose, capillary     Status: Abnormal   Collection Time: 12/13/17  6:28 AM  Result Value Ref Range   Glucose-Capillary 151 (H) 70 - 99 mg/dL   Comment 1 Notify RN    Comment 2 Document in Chart   Glucose, capillary     Status: Abnormal   Collection Time: 12/13/17  9:18 AM  Result Value Ref Range   Glucose-Capillary 162 (H) 70 - 99 mg/dL   Comment 1 Notify RN    Comment 2 Document in Chart   Glucose, capillary     Status: Abnormal   Collection Time: 12/13/17  2:19 PM  Result Value Ref Range   Glucose-Capillary 185 (H) 70 - 99 mg/dL  Glucose, capillary     Status: Abnormal   Collection Time: 12/13/17  4:15 PM  Result Value Ref Range   Glucose-Capillary 250 (H) 70 - 99 mg/dL  Glucose, capillary     Status: Abnormal   Collection Time: 12/13/17  9:25 PM  Result Value Ref Range   Glucose-Capillary 271 (H) 70 - 99 mg/dL  Basic metabolic panel     Status: Abnormal   Collection Time: 12/14/17  3:11 AM  Result Value Ref Range   Sodium 123 (L) 135 - 145 mmol/L   Potassium 4.2 3.5 - 5.1 mmol/L   Chloride 92 (L) 98 - 111 mmol/L   CO2 20 (L) 22 - 32 mmol/L   Glucose, Bld 287 (H) 70 - 99 mg/dL   BUN 19 6 - 20 mg/dL   Creatinine, Ser 1.61 (H) 0.61 - 1.24 mg/dL   Calcium 7.5 (L) 8.9 - 10.3 mg/dL   GFR calc non Af Amer >60 >60 mL/min   GFR calc Af Amer >60 >60 mL/min   Anion gap 11 5 - 15  CBC     Status: Abnormal   Collection Time: 12/14/17  3:11 AM  Result Value Ref Range   WBC 17.8 (H) 4.0 - 10.5 K/uL   RBC 2.70 (L) 4.22 - 5.81 MIL/uL   Hemoglobin 8.3 (L) 13.0 - 17.0 g/dL   HCT 09.6 (L) 04.5 - 40.9 %   MCV 91.9 80.0 - 100.0 fL  MCH 30.7 26.0 - 34.0 pg   MCHC 33.5 30.0 - 36.0 g/dL   RDW 40.9 81.1 - 91.4 %   Platelets 374 150 - 400 K/uL   nRBC 0.0 0.0 - 0.2 %   No results  found.  Assessment/Plan: Diagnosis: right BKA  Labs independently reviewed.  Records reviewed and summated above. Clean amputation daily with soap and water Monitor incision site for signs of infection or impending skin breakdown. Staples to remain in place for 3-4 weeks Stump shrinker, for edema control  Scar mobilization massaging to prevent soft tissue adherence Stump protector during therapies Prevent flexion contractures by implementing the following:   Encourage prone lying for 20-30 mins per day BID to avoid hip flexion  Contractures if medically appropriate;  Avoid pillow under knees when patient is lying in bed in order to prevent both  knee and hip flexion contractures;  Avoid prolonged sitting Post surgical pain control with oral medication Phantom limb pain control with physical modalities including desensitization techniques (gentle self massage to the residual stump,hot packs if sensation intact, [place]) and mirror therapy, TENS. If ineffective, consider pharmacological treatment for neuropathic pain (e.g gabapentin, pregabalin, amytriptalyine, duloxetine).  When using wheelchair, patient should have knee on amputated side fully extended with board under the seat cushion. Avoid injury to contralateral side  1. Does the need for close, 24 hr/day medical supervision in concert with the patient's rehab needs make it unreasonable for this patient to be served in a less intensive setting? Yes  2. Co-Morbidities requiring supervision/potential complications: tobacco abuse (counsel), diabetes mellitus with peripheral neuropathy (Monitor in accordance with exercise and adjust meds as necessary), HTN (monitor and provide prns in accordance with increased physical exertion and pain), peripheral vascular disease (cont meds), post-op pain management (Biofeedback training with therapies to help reduce reliance on opiate pain medications, particularly morphine PCA, monitor pain control during  therapies, and sedation at rest and titrate to maximum efficacy to ensure participation and gains in therapies), Acute blood loss anemia (transfuse if necessary to ensure appropriate perfusion for increased activity tolerance), leukocytosis (cont to monitor for signs and symptoms of infection, further workup if indicated) 3. Due to safety, skin/wound care, disease management, pain management and patient education, does the patient require 24 hr/day rehab nursing? Yes 4. Does the patient require coordinated care of a physician, rehab nurse, PT (1-2 hrs/day, 5 days/week) and OT (1-2 hrs/day, 5 days/week) to address physical and functional deficits in the context of the above medical diagnosis(es)? Yes Addressing deficits in the following areas: balance, endurance, locomotion, strength, transferring, bathing, dressing, toileting and psychosocial support 5. Can the patient actively participate in an intensive therapy program of at least 3 hrs of therapy per day at least 5 days per week? Potentially 6. The potential for patient to make measurable gains while on inpatient rehab is excellent 7. Anticipated functional outcomes upon discharge from inpatient rehab are TBD  with PT, TBD with OT, n/a with SLP. 8. Estimated rehab length of stay to reach the above functional goals is: TBD 9. Anticipated D/C setting: Home 10. Anticipated post D/C treatments: HH therapy and Home excercise program 11. Overall Rehab/Functional Prognosis: good  RECOMMENDATIONS: This patient's condition is appropriate for continued rehabilitative care in the following setting: Likely CIR when pain better controlled, able to participate in 3 hours of therapy/day, and medically stable.  Will await therapy evals. Patient has agreed to participate in recommended program. Yes Note that insurance prior authorization may be required for reimbursement  for recommended care.  Comment: Rehab Admissions Coordinator to follow up.   I have  personally performed a face to face diagnostic evaluation, including, but not limited to relevant history and physical exam findings, of this patient and developed relevant assessment and plan.  Additionally, I have reviewed and concur with the physician assistant's documentation above.   Maryla Morrow, MD, ABPMR Mcarthur Rossetti Angiulli, PA-C 12/14/2017

## 2017-12-14 NOTE — Progress Notes (Signed)
Inpatient Rehabilitation Admissions Coordinator  I met with patient, his wife, Mom and two children at bedside. We discussed goals and expectations of an inpt rehab admit. Pt has just received pain med therefore distracted at this time. I will follow up tomorrow to assist with planning timing of admission when medically ready.  Jay Baxter, RN, MSN Rehab Admissions Coordinator [phone] 12/14/2017 5:54 PM

## 2017-12-15 ENCOUNTER — Inpatient Hospital Stay (HOSPITAL_COMMUNITY)
Admission: RE | Admit: 2017-12-15 | Discharge: 2017-12-17 | DRG: 560 | Discharge status: Against Medical Advice | Payer: Medicare Other | Source: Intra-hospital | Attending: Physical Medicine & Rehabilitation | Admitting: Physical Medicine & Rehabilitation

## 2017-12-15 ENCOUNTER — Encounter (HOSPITAL_COMMUNITY): Payer: Self-pay | Admitting: Emergency Medicine

## 2017-12-15 ENCOUNTER — Other Ambulatory Visit: Payer: Self-pay

## 2017-12-15 DIAGNOSIS — I10 Essential (primary) hypertension: Secondary | ICD-10-CM | POA: Diagnosis present

## 2017-12-15 DIAGNOSIS — I739 Peripheral vascular disease, unspecified: Secondary | ICD-10-CM

## 2017-12-15 DIAGNOSIS — E1165 Type 2 diabetes mellitus with hyperglycemia: Secondary | ICD-10-CM | POA: Diagnosis present

## 2017-12-15 DIAGNOSIS — D62 Acute posthemorrhagic anemia: Secondary | ICD-10-CM | POA: Diagnosis present

## 2017-12-15 DIAGNOSIS — Z89511 Acquired absence of right leg below knee: Secondary | ICD-10-CM

## 2017-12-15 DIAGNOSIS — E1142 Type 2 diabetes mellitus with diabetic polyneuropathy: Secondary | ICD-10-CM

## 2017-12-15 DIAGNOSIS — Z4781 Encounter for orthopedic aftercare following surgical amputation: Secondary | ICD-10-CM | POA: Diagnosis present

## 2017-12-15 DIAGNOSIS — Z6821 Body mass index (BMI) 21.0-21.9, adult: Secondary | ICD-10-CM

## 2017-12-15 DIAGNOSIS — S88111A Complete traumatic amputation at level between knee and ankle, right lower leg, initial encounter: Secondary | ICD-10-CM | POA: Diagnosis not present

## 2017-12-15 DIAGNOSIS — E871 Hypo-osmolality and hyponatremia: Secondary | ICD-10-CM

## 2017-12-15 DIAGNOSIS — G8918 Other acute postprocedural pain: Secondary | ICD-10-CM

## 2017-12-15 DIAGNOSIS — K219 Gastro-esophageal reflux disease without esophagitis: Secondary | ICD-10-CM | POA: Diagnosis present

## 2017-12-15 DIAGNOSIS — Z7982 Long term (current) use of aspirin: Secondary | ICD-10-CM | POA: Diagnosis not present

## 2017-12-15 DIAGNOSIS — G546 Phantom limb syndrome with pain: Secondary | ICD-10-CM | POA: Diagnosis present

## 2017-12-15 DIAGNOSIS — K5900 Constipation, unspecified: Secondary | ICD-10-CM | POA: Diagnosis present

## 2017-12-15 DIAGNOSIS — E1151 Type 2 diabetes mellitus with diabetic peripheral angiopathy without gangrene: Secondary | ICD-10-CM | POA: Diagnosis present

## 2017-12-15 DIAGNOSIS — F17210 Nicotine dependence, cigarettes, uncomplicated: Secondary | ICD-10-CM | POA: Diagnosis present

## 2017-12-15 DIAGNOSIS — E46 Unspecified protein-calorie malnutrition: Secondary | ICD-10-CM | POA: Diagnosis present

## 2017-12-15 DIAGNOSIS — Z8249 Family history of ischemic heart disease and other diseases of the circulatory system: Secondary | ICD-10-CM

## 2017-12-15 DIAGNOSIS — Z794 Long term (current) use of insulin: Secondary | ICD-10-CM | POA: Diagnosis not present

## 2017-12-15 DIAGNOSIS — E8809 Other disorders of plasma-protein metabolism, not elsewhere classified: Secondary | ICD-10-CM | POA: Diagnosis present

## 2017-12-15 LAB — CBC
HCT: 24.8 % — ABNORMAL LOW (ref 39.0–52.0)
Hemoglobin: 8.4 g/dL — ABNORMAL LOW (ref 13.0–17.0)
MCH: 31 pg (ref 26.0–34.0)
MCHC: 33.9 g/dL (ref 30.0–36.0)
MCV: 91.5 fL (ref 80.0–100.0)
NRBC: 0 % (ref 0.0–0.2)
PLATELETS: 338 10*3/uL (ref 150–400)
RBC: 2.71 MIL/uL — ABNORMAL LOW (ref 4.22–5.81)
RDW: 14.6 % (ref 11.5–15.5)
WBC: 14.8 10*3/uL — ABNORMAL HIGH (ref 4.0–10.5)

## 2017-12-15 LAB — GLUCOSE, CAPILLARY
GLUCOSE-CAPILLARY: 44 mg/dL — AB (ref 70–99)
GLUCOSE-CAPILLARY: 183 mg/dL — AB (ref 70–99)
Glucose-Capillary: 44 mg/dL — CL (ref 70–99)
Glucose-Capillary: 129 mg/dL — ABNORMAL HIGH (ref 70–99)
Glucose-Capillary: 56 mg/dL — ABNORMAL LOW (ref 70–99)
Glucose-Capillary: 100 mg/dL — ABNORMAL HIGH (ref 70–99)
Glucose-Capillary: 58 mg/dL — ABNORMAL LOW (ref 70–99)
Glucose-Capillary: 122 mg/dL — ABNORMAL HIGH (ref 70–99)

## 2017-12-15 LAB — BASIC METABOLIC PANEL
Anion gap: 5 (ref 5–15)
BUN: 14 mg/dL (ref 6–20)
CO2: 23 mmol/L (ref 22–32)
CREATININE: 0.83 mg/dL (ref 0.61–1.24)
Calcium: 7.4 mg/dL — ABNORMAL LOW (ref 8.9–10.3)
Chloride: 97 mmol/L — ABNORMAL LOW (ref 98–111)
GFR calc non Af Amer: >60 mL/min (ref >60)
Glucose, Bld: 51 mg/dL — ABNORMAL LOW (ref 70–99)
Potassium: 3.5 mmol/L (ref 3.5–5.1)
SODIUM: 125 mmol/L — AB (ref 135–145)

## 2017-12-15 MED ORDER — ACETAMINOPHEN 650 MG RE SUPP: 325.0000 mg | RECTAL | Status: DC | PRN

## 2017-12-15 MED ORDER — UMECLIDINIUM BROMIDE 62.5 MCG/INH IN AEPB: 1.0000 | INHALATION_SPRAY | Freq: Every day | RESPIRATORY_TRACT | Status: DC

## 2017-12-15 MED ORDER — LISINOPRIL 20 MG PO TABS
20.0000 mg | ORAL_TABLET | Freq: Every evening | ORAL | Status: DC
  Administered 2017-12-15 – 2017-12-17 (×3): 20 mg via ORAL
  Filled 2017-12-15 (×3): qty 1

## 2017-12-15 MED ORDER — SPIRITUS FRUMENTI
1.0000 | Freq: Three times a day (TID) | ORAL | Status: DC
  Filled 2017-12-15 (×2): qty 1

## 2017-12-15 MED ORDER — POLYETHYLENE GLYCOL 3350 17 G PO PACK: 17.0000 g | PACK | Freq: Every day | ORAL | Status: DC | PRN

## 2017-12-15 MED ORDER — IMIPRAMINE HCL 25 MG PO TABS
50.0000 mg | ORAL_TABLET | Freq: Every day | ORAL | Status: DC
  Administered 2017-12-15: 50 mg via ORAL
  Filled 2017-12-15: qty 2
  Filled 2017-12-15 (×3): qty 1
  Filled 2017-12-15: qty 2

## 2017-12-15 MED ORDER — BISACODYL 10 MG RE SUPP: 10.0000 mg | Freq: Every day | RECTAL | Status: DC | PRN

## 2017-12-15 MED ORDER — OXYCODONE-ACETAMINOPHEN 5-325 MG PO TABS
1.0000 | ORAL_TABLET | ORAL | Status: DC | PRN
  Administered 2017-12-15 (×2): 2 via ORAL
  Administered 2017-12-16 (×2): 1 via ORAL
  Administered 2017-12-16 – 2017-12-17 (×5): 2 via ORAL
  Filled 2017-12-15 (×2): qty 2
  Filled 2017-12-15: qty 1
  Filled 2017-12-15 (×7): qty 2

## 2017-12-15 MED ORDER — ALUM & MAG HYDROXIDE-SIMETH 200-200-20 MG/5ML PO SUSP: 15.0000 mL | ORAL | Status: DC | PRN

## 2017-12-15 MED ORDER — MELATONIN 3 MG PO TABS
6.0000 mg | ORAL_TABLET | Freq: Every evening | ORAL | Status: DC | PRN
  Administered 2017-12-15: 6 mg via ORAL
  Filled 2017-12-15 (×4): qty 2

## 2017-12-15 MED ORDER — NICOTINE 21 MG/24HR TD PT24
21.0000 mg | MEDICATED_PATCH | Freq: Every day | TRANSDERMAL | Status: DC
  Filled 2017-12-15 (×2): qty 1

## 2017-12-15 MED ORDER — PANTOPRAZOLE SODIUM 40 MG PO TBEC
40.0000 mg | DELAYED_RELEASE_TABLET | Freq: Every day | ORAL | Status: DC
  Administered 2017-12-16 – 2017-12-17 (×2): 40 mg via ORAL
  Filled 2017-12-15 (×2): qty 1

## 2017-12-15 MED ORDER — SORBITOL 70 % SOLN: 30.0000 mL | Freq: Every day | Status: DC | PRN

## 2017-12-15 MED ORDER — INSULIN ASPART 100 UNIT/ML ~~LOC~~ SOLN
0.0000 [IU] | Freq: Three times a day (TID) | SUBCUTANEOUS | Status: DC
  Administered 2017-12-16: 1 [IU] via SUBCUTANEOUS
  Administered 2017-12-16: 3 [IU] via SUBCUTANEOUS
  Administered 2017-12-17: 2 [IU] via SUBCUTANEOUS

## 2017-12-15 MED ORDER — HEPARIN SODIUM (PORCINE) 5000 UNIT/ML IJ SOLN
5000.0000 [IU] | Freq: Three times a day (TID) | INTRAMUSCULAR | Status: DC
  Administered 2017-12-15 – 2017-12-17 (×6): 5000 [IU] via SUBCUTANEOUS
  Filled 2017-12-15 (×7): qty 1

## 2017-12-15 MED ORDER — HEPARIN SODIUM (PORCINE) 5000 UNIT/ML IJ SOLN: 5000.0000 [IU] | Freq: Three times a day (TID) | INTRAMUSCULAR | Status: DC

## 2017-12-15 MED ORDER — INSULIN GLARGINE 100 UNIT/ML ~~LOC~~ SOLN
12.0000 [IU] | Freq: Every day | SUBCUTANEOUS | Status: DC
  Administered 2017-12-16: 12 [IU] via SUBCUTANEOUS
  Filled 2017-12-15 (×2): qty 0.12

## 2017-12-15 MED ORDER — ASPIRIN 81 MG PO CHEW
81.0000 mg | CHEWABLE_TABLET | Freq: Every evening | ORAL | Status: DC
  Administered 2017-12-15 – 2017-12-17 (×3): 81 mg via ORAL
  Filled 2017-12-15 (×3): qty 1

## 2017-12-15 MED ORDER — DOCUSATE SODIUM 100 MG PO CAPS
100.0000 mg | ORAL_CAPSULE | Freq: Every day | ORAL | Status: DC
  Filled 2017-12-15 (×2): qty 1

## 2017-12-15 MED ORDER — ACETAMINOPHEN 325 MG PO TABS
325.0000 mg | ORAL_TABLET | ORAL | Status: DC | PRN
  Administered 2017-12-16: 650 mg via ORAL
  Filled 2017-12-15 (×3): qty 2

## 2017-12-15 MED ORDER — ALPRAZOLAM 0.25 MG PO TABS
1.0000 mg | ORAL_TABLET | Freq: Four times a day (QID) | ORAL | Status: DC | PRN
  Administered 2017-12-16: 1 mg via ORAL
  Filled 2017-12-15 (×3): qty 4

## 2017-12-15 MED ORDER — SIMVASTATIN 20 MG PO TABS
20.0000 mg | ORAL_TABLET | Freq: Every day | ORAL | Status: DC
  Administered 2017-12-15 – 2017-12-17 (×3): 20 mg via ORAL
  Filled 2017-12-15 (×3): qty 1

## 2017-12-15 NOTE — Progress Notes (Signed)
Called by RN to report pt is more agitated this morning.  The pt's wife explained to the nurse that the pt drinks a 12 pack of beer a day.  Discussed with Dr. Arbie Cookey and will order beer tid with meals.  Doreatha Massed, [facility] 12/15/2017 10:50 AM

## 2017-12-15 NOTE — Progress Notes (Signed)
   VASCULAR SURGERY ASSESSMENT & PLAN:   2 Days Post-Op s/p: Right below the knee amputation.  I inspected the wound yesterday and it look fine.  Begin daily dressing changes today.  His pain is under much better control.  Awaiting bed on rehab.  I have written for dressing changes.  SUBJECTIVE:   Pain under much better control.  PHYSICAL EXAM:   Vitals:   12/14/17 1823 12/14/17 1932 12/15/17 0000 12/15/17 0328  BP: 112/72  122/84 106/74  Pulse:   96 90  Resp:   19 19  Temp:  97.8 F (36.6 C)  98.9 F (37.2 C)  TempSrc:  Oral  Oral  SpO2:   96% 97%   Dressing on his right below the amputation is dry.  LABS:   Lab Results  Component Value Date   WBC 14.8 (H) 12/15/2017   HGB 8.4 (L) 12/15/2017   HCT 24.8 (L) 12/15/2017   MCV 91.5 12/15/2017   PLT 338 12/15/2017   Lab Results  Component Value Date   CREATININE 0.83 12/15/2017   Lab Results  Component Value Date   INR 0.91 08/02/2017   CBG (last 3)  Recent Labs    12/15/17 0608 12/15/17 0642 12/15/17 0715  GLUCAP 44* 44* 129*    PROBLEM LIST:    Active Problems:   PAD (peripheral artery disease) (HCC)   Unilateral complete BKA, right, initial encounter (HCC)   Post-operative pain   Benign essential HTN   Tobacco abuse   Type 2 diabetes mellitus with peripheral neuropathy (HCC)   Acute blood loss anemia   Leukocytosis   CURRENT MEDS:   . aspirin  81 mg Oral QPM  . docusate sodium  100 mg Oral Daily  . heparin  5,000 Units Subcutaneous Q8H  . imipramine  50 mg Oral QHS  . Influenza vac split quadrivalent PF  0.5 mL Intramuscular Tomorrow-1000  . insulin aspart  0-9 Units Subcutaneous TID WC  . insulin glargine  12 Units Subcutaneous QHS  . lisinopril  20 mg Oral QPM  . nicotine  21 mg Transdermal Daily  . pantoprazole  40 mg Oral Daily  . simvastatin  20 mg Oral q1800  . umeclidinium bromide  1 puff Inhalation Daily    Waverly Ferrari Beeper: [phone] Office:  [phone] 12/15/2017

## 2017-12-15 NOTE — Plan of Care (Signed)
  Problem: Consults Goal: RH GENERAL PATIENT EDUCATION Description See Patient Education module for education specifics. Outcome: Not Progressing Goal: Skin Care Protocol Initiated - if Braden Score 18 or less Description If consults are not indicated, leave blank or document N/A Outcome: Not Progressing Goal: Diabetes Guidelines if Diabetic/Glucose > 140 Description If diabetic or lab glucose is > 140 mg/dl - Initiate Diabetes/Hyperglycemia Guidelines & Document Interventions  Outcome: Not Progressing   Problem: RH BOWEL ELIMINATION Goal: RH STG MANAGE BOWEL WITH ASSISTANCE Description STG Manage Bowel with min Assistance.  Outcome: Not Progressing Goal: RH STG MANAGE BOWEL W/MEDICATION W/ASSISTANCE Description STG Manage Bowel with Medication with min Assistance.  Outcome: Not Progressing   Problem: RH BLADDER ELIMINATION Goal: RH STG MANAGE BLADDER WITH ASSISTANCE Description STG Manage Bladder With min Assistance  Outcome: Not Progressing Goal: RH STG MANAGE BLADDER WITH MEDICATION WITH ASSISTANCE Description STG Manage Bladder With Medication With min Assistance.  Outcome: Not Progressing Goal: RH STG MANAGE BLADDER WITH EQUIPMENT WITH ASSISTANCE Description STG Manage Bladder With Equipment With Assistance Outcome: Not Progressing   Problem: RH SKIN INTEGRITY Goal: RH STG SKIN FREE OF INFECTION/BREAKDOWN Outcome: Not Progressing Goal: RH STG MAINTAIN SKIN INTEGRITY WITH ASSISTANCE Description STG Maintain Skin Integrity With min-mod Assistance.  Outcome: Not Progressing Goal: RH STG ABLE TO PERFORM INCISION/WOUND CARE W/ASSISTANCE Description STG Able To Perform Incision/Wound Care With mod-max Assistance.  Outcome: Not Progressing   Problem: RH SAFETY Goal: RH STG ADHERE TO SAFETY PRECAUTIONS W/ASSISTANCE/DEVICE Description STG Adhere to Safety Precautions With min-mod Assistance/Device.  Outcome: Not Progressing Goal: RH STG DECREASED RISK OF FALL WITH  ASSISTANCE Description STG Decreased Risk of Fall With min-mod Assistance.  Outcome: Not Progressing   Problem: RH PAIN MANAGEMENT Goal: RH STG PAIN MANAGED AT OR BELOW PT'S PAIN GOAL Outcome: Not Progressing   Problem: RH KNOWLEDGE DEFICIT GENERAL Goal: RH STG INCREASE KNOWLEDGE OF SELF CARE AFTER HOSPITALIZATION Outcome: Not Progressing   Problem: RH Vision Goal: RH LTG Vision (Specify) Outcome: Not Progressing  New admit

## 2017-12-15 NOTE — Progress Notes (Signed)
Pt A/O, no noted distress. 10/10 the discharge unit administered pain regimen, before bringing him to the unit. Skin issues Rt BKA, staples, no drainage. Pt resides with spouse and children. Cell phone and charge in the bed with him.

## 2017-12-15 NOTE — H&P (Signed)
Physical Medicine and Rehabilitation Admission H&P  HPI: Jay Barber is a 37 year old right-handed male with history of tobacco/Alcohol abuse, diabetes mellitus with peripheral neuropathy, hypertension, peripheral vascular disease with history of right femoral-popliteal bypass grafting June 2019 and history of multiple right toe amputations. Per chart review patient lives with wife and 2 teenage daughters. One level home 3 steps to entry. Wife can assist as needed. Patient independent with a knee scooter prior to admission. Presented 12/13/2017 with nonhealing right leg wound and occlusion of femoral-popliteal bypass graft. Limb was not felt to be salvageable. 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 Waverly Ferrari. Hospital course pain management. Acute blood loss anemia 8.4, leukocytosis 14,800. Subcutaneous heparin for DVT prophylaxis. Therapy evaluations completed with recommendations of physical medicine rehab consult. Patient was admitted for a comprehensive rehab program.  Review of Systems  Constitutional: Negative for chills and fever.  HENT: Negative for hearing loss.  Eyes: Negative for blurred vision and double vision.  Respiratory: Positive for cough.  Cardiovascular: Positive for leg swelling. Negative for chest pain.  Gastrointestinal: Positive for constipation. Negative for nausea.  GERD  Genitourinary:  Nocturia  Musculoskeletal: Positive for back pain and myalgias.  Skin: Negative for rash.  Psychiatric/Behavioral: Positive for depression.  Panic attacks  All other systems reviewed and are negative.       Past Medical History:  Diagnosis Date  . Anemia   . Depression   . Diabetes mellitus without complication (HCC)    takes Lantus and Novolog Type I  . Drainage from wound    right big toe  . Family history of adverse reaction to anesthesia    mother had PONV- 09/27/17 - "years ago"  . Full dentures   . GERD (gastroesophageal reflux disease)   . History of  bronchitis    only had once in his life-3+yrs ago  . Hypertension   . Neuropathy associated with endocrine disorder (HCC)   . Nocturia   . Panic attacks    hx of but no meds required  . Peripheral vascular disease (HCC)   . Wears contact lenses         Past Surgical History:  Procedure Laterality Date  . ABDOMINAL AORTAGRAM N/A 11/06/2012   Procedure: ABDOMINAL Ronny Flurry; Surgeon: Chuck Hint, MD; Location: [facility] CATH LAB; Service: Cardiovascular; Laterality: N/A;  . ABDOMINAL AORTOGRAM N/A 06/03/2017   Procedure: ABDOMINAL AORTOGRAM; Surgeon: Chuck Hint, MD; Location: [facility] INVASIVE CV LAB; Service: Cardiovascular; Laterality: N/A;  . ABDOMINAL AORTOGRAM W/LOWER EXTREMITY N/A 12/02/2017   Procedure: ABDOMINAL AORTOGRAM W/LOWER EXTREMITY; Surgeon: Chuck Hint, MD; Location[facility] INVASIVE CV LAB; Service: Cardiovascular; Laterality: N/A;  . AMPUTATION Right 05/11/2013   Procedure: AMPUTATION DIGIT; Surgeon: Nadara Mustard, MD; Location: [facility] OR; Service: Orthopedics; Laterality: Right; Right Great Toe Amputation at MTP  . AMPUTATION Right 09/28/2017   Procedure: AMPUTATION RAY 5TH TOE POSSIBLE 4TH TOE; Surgeon: Larina Earthly, MD; Location: [facility] OR; Service: Vascular; Laterality: Right;  . AMPUTATION Right 12/13/2017   Procedure: AMPUTATION BELOW KNEE; Surgeon: Chuck Hint, MD; Location: [facility] OR; Service: Vascular; Laterality: Right;  . APPLICATION OF WOUND VAC Right 09/28/2017   Procedure: APPLICATION OF WOUND VAC - RIGHT FOOT; Surgeon: Larina Earthly, MD; Location: [facility] OR; Service: Vascular; Laterality: Right;  . BELOW KNEE LEG AMPUTATION Right 12/13/2017  . ESOPHAGOGASTRODUODENOSCOPY    . FEMORAL ARTERY - POPLITEAL ARTERY BYPASS GRAFT  08/09/2017  . FEMORAL-POPLITEAL BYPASS GRAFT Right 08/09/2017   Procedure: BYPASS GRAFT FEMORAL-BELOW KNEE POPLITEAL  ARTERY USING X 80CM GORE PROPATEN VASCULAR GRAFT; Surgeon: Chuck Hint, MD; Location: [facility] OR; Service:  Vascular; Laterality: Right;  . INTRAOPERATIVE ARTERIOGRAM Right 08/09/2017   Procedure: INTRA OPERATIVE ARTERIOGRAM RIGHT LOWER LEG; Surgeon: Chuck Hint, MD; Location: [facility] OR; Service: Vascular; Laterality: Right;  . LOWER EXTREMITY ANGIOGRAPHY Bilateral 06/03/2017   Procedure: Lower Extremity Angiography; Surgeon: Chuck Hint, MD; Location: [facility] INVASIVE CV LAB; Service: Cardiovascular; Laterality: Bilateral;  . MOUTH SURGERY    . MULTIPLE TOOTH EXTRACTIONS          Family History  Problem Relation Age of Onset  . Heart disease Father    before age 51  . Hypertension Mother   . Depression Mother   . GER disease Mother    Social History: reports that he has been smoking cigarettes. He has a 14.00 pack-year smoking history. He has never used smokeless tobacco. He reports that he drinks alcohol. He reports that he does not use drugs.  Allergies:       Allergies  Allergen Reactions  . Bupropion     Excessive fatigue          Medications Prior to Admission  Medication Sig Dispense Refill  . ALPRAZolam (XANAX) 1 MG tablet Take 1 mg by mouth 4 (four) times daily as needed for anxiety.     Marland Kitchen aspirin 81 MG tablet Take 81 mg by mouth every evening.     . Dakins (HYSEPT) 0.25 % SOLN Apply 1 application topically 2 (two) times daily.  1  . ibuprofen (ADVIL,MOTRIN) 200 MG tablet Take 400 mg by mouth every 6 (six) hours as needed.    Marland Kitchen imipramine (TOFRANIL) 25 MG tablet Take 50 mg by mouth at bedtime.     . insulin aspart (NOVOLOG) 100 UNIT/ML injection Inject 2-4 Units into the skin 3 (three) times daily with meals. Per sliding scale    . insulin glargine (LANTUS) 100 UNIT/ML injection Inject 12 Units into the skin at bedtime.     Marland Kitchen lisinopril (PRINIVIL,ZESTRIL) 20 MG tablet Take 20 mg by mouth every evening.   5  . Melatonin 3 MG CAPS Take 6 mg by mouth at bedtime as needed (sleep).    . Multiple Vitamins-Minerals (ALIVE MENS ENERGY PO) Take 1 tablet by mouth daily.    Marland Kitchen  omeprazole (PRILOSEC OTC) 20 MG tablet Take 20 mg by mouth daily as needed (for acid reflux).    . simvastatin (ZOCOR) 20 MG tablet Take 1 tablet (20 mg total) by mouth daily at 6 PM. 30 tablet 5  . sulfamethoxazole-trimethoprim (BACTRIM DS,SEPTRA DS) 800-160 MG tablet Take 1 tablet by mouth 2 (two) times daily.    Marland Kitchen tiotropium (SPIRIVA) 18 MCG inhalation capsule Place 18 mcg into inhaler and inhale daily.    Marland Kitchen NICOTINE STEP 1 21 MG/24HR patch Place 21 mg onto the skin daily.   0   Drug Regimen Review  Drug regimen was reviewed and remains appropriate with no significant issues identified  Home:  Home Living  Family/patient expects to be discharged to:: Private residence  Living Arrangements: Spouse/significant other, Children  Available Help at Discharge: Family, Available 24 hours/day  Type of Home: House  Home Access: Stairs to enter  [employer] of Steps: 3  Home Layout: One level  Bathroom Shower/Tub: Tub/shower unit, Pension scheme manager: Standard  Home Equipment: Bedside commode, Cane - single point, Grab bars - toilet  Additional Comments: put grab bars by toilet (mounted). Tub seat  is sturdy but not a bench. Got tub seat in '14, he thinks  Functional History:  Prior Function  Level of Independence: Needs assistance  ADL's / Homemaking Assistance Needed: wife had to help with adls over past 2 weeks. LLE swollen  Functional Status:  Mobility:  Bed Mobility  Overal bed mobility: Needs Assistance  Bed Mobility: Supine to Sit  Supine to sit: Supervision  General bed mobility comments: oob by PT  Transfers  Overall transfer level: Needs assistance  Equipment used: Rolling walker (2 wheeled)  Transfers: Sit to/from Stand, [employer] Transfers  Sit to Stand: Min assist, Mod assist  Stand pivot transfers: Min assist  General transfer comment: OOB BY PT: DECLINED OUT OF CHAIR/STANDING WITH OT    ADL:  ADL  Overall ADL's : Needs assistance/impaired    Eating/Feeding: Independent  Grooming: Set up, Sitting  Upper Body Bathing: Set up, Sitting  Lower Body Bathing: Moderate assistance, Sitting/lateral leans  Upper Body Dressing : Minimal assistance, Sitting(lines)  Lower Body Dressing: Moderate assistance, Sitting/lateral leans  Toileting- Clothing Manipulation and Hygiene: Moderate assistance, Sitting/lateral lean  General ADL Comments: pt declined standing; able to laterally lean to bil sides. Educated on safety with forward leans  Cognition:  Cognition  Overall Cognitive Status: Within Functional Limits for tasks assessed  Orientation Level: Oriented X4  Cognition  Arousal/Alertness: Awake/alert  Behavior During Therapy: WFL for tasks assessed/performed  Overall Cognitive Status: Within Functional Limits for tasks assessed  Physical Exam:  Blood pressure 106/74, pulse 90, temperature 98.9 F (37.2 C), temperature source Oral, resp. rate 19, SpO2 97 %.  Physical Exam  Constitutional: He is oriented to person, place, and time. He appears well-nourished. He appears distressed.  HENT:  Head: Normocephalic and atraumatic.  Eyes: Pupils are equal, round, and reactive to light.  Neck: No thyromegaly present.  Cardiovascular: Normal rate and regular rhythm. Exam reveals no friction rub.  No murmur heard.  Respiratory: Effort normal.  GI: Soft. He exhibits no distension. There is no tenderness.  Musculoskeletal: He exhibits edema.  Neurological: He is alert and oriented to person, place, and time. No cranial nerve deficit. Coordination normal.  Patient is cooperative. UE 5/5/ LLE 4/5 prox to distal. RLE: can lift right leg sl off bed  Skin:  BKA site is dressed appropriately tender  Psychiatric: He has a normal mood and affect. His behavior is normal.   Lab Results Last 48 Hours  Imaging Results (Last 48 hours)     Medical Problem List and Plan:  1. Decreased functional mobility secondary to peripheral vascular disease with failed right femoral-popliteal bypass graft/nonhealing right leg wound. 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 DVT Prophylaxis/Anticoagulation: Subcutaneous heparin. Monitor for any bleeding episodes  3. Pain Management: Oxycodone as needed.  -add gabapentin for phantom limb pain  -discussed edema control and tactile desensitization  4. Mood: Tofranil 50 mg nightly, Xanax 1 mg 4 times daily as needed, melatonin nightly as needed  5. Neuropsych: This patient is capable of making decisions on his own behalf.  6. Skin/Wound Care: Routine skin checks  7. Fluids/Electrolytes/Nutrition: Routine in and outs with follow-up chemistries  8. Acute blood loss anemia. Follow-up CBC  9. Diabetes mellitus with peripheral neuropathy. Hemoglobin A1c 7.1. Lantus insulin 12 units nightly. Check blood sugars before meals and at bedtime. Adjust regimen as needed.  10. Hypertension. Lisinopril 20 mg daily. Monitor with increased mobility  11. Tobacco/alcohol abuse. NicoDerm patch. Provide counseling. Monitor for any signs of withdrawal  -12 pack per day history?  12. Constipation. Laxative assistance   Post Admission Physician Evaluation:  1. Functional deficits secondary to right  BKA. 2. Patient is admitted to receive collaborative, interdisciplinary care between the physiatrist, rehab nursing staff, and therapy team. 3. Patient's level of medical complexity and substantial therapy needs in context of that medical necessity cannot be provided at a lesser intensity of care such as a SNF. 4. Patient has experienced substantial functional loss from his/her baseline which was documented above under the "Functional History" and "Functional Status" headings. Judging by the patient's diagnosis, physical exam, and functional history, the patient has potential for functional progress which will result in measurable gains while on inpatient rehab. These gains will be of substantial and practical use upon discharge in facilitating mobility and self-care at the household level. 5. Physiatrist will provide 24 hour management of medical needs as well as oversight of the therapy plan/treatment and provide guidance as appropriate regarding the interaction of the two. 6. The Preadmission Screening has been reviewed and patient status is unchanged unless otherwise stated above. 7. 24 hour rehab nursing will assist with bladder management, bowel management, safety, skin/wound care, disease management, medication administration, pain management and patient education and help integrate therapy concepts, techniques,education, etc. 8. PT will assess and treat for/with: Lower extremity strength, range of motion, stamina, balance, functional mobility, safety, adaptive techniques and equipment, pre-prosthetic education, pain mgt. Goals are: mod I. 9. OT will assess and treat for/with: ADL's, functional mobility, safety, upper extremity strength, adaptive techniques and equipment, pain mgt, pre-prosthetic education. Goals are: mod I. Therapy may proceed with showering this patient if limb is water-proofed 10. SLP will assess and treat for/with: n/a. Goals are: n/a. 11. Case Management and Social Worker will  assess and treat for psychological issues and discharge planning. 12. Team conference will be held weekly to assess progress toward goals and to determine barriers to discharge. 13. Patient will receive at least 3 hours of therapy per day at least 5 days per week. 14. ELOS: 7 days  15. Prognosis: excellent 16.  I have personally performed a face to face diagnostic evaluation of this patient and formulated the key components of the plan. Additionally, I have personally reviewed laboratory data, imaging studies, as well as relevant notes and concur with the physician assistant's documentation above.    Ranelle Oyster, MD, FAAPMR  Mcarthur Rossetti Angiulli, PA-C  12/15/2017  [name] status has not changed. The original post admission physician evaluation remains appropriate, and any changes from the pre-admission screening or documentation from the acute chart are noted above.  Ranelle Oyster, MD 12/15/2017

## 2017-12-15 NOTE — Progress Notes (Signed)
Results for JESSTIN, STUDSTILL (MRN [id number]) as of 12/15/2017 12:18  Ref. Range 12/14/2017 18:58 12/14/2017 21:52 12/15/2017 06:08 12/15/2017 06:42 12/15/2017 07:15  Glucose-Capillary Latest Ref Range: 70 - 99 mg/dL 409 (H) 811 (H) 44 (LL) 44 (LL) 129 (H)  Noted that fasting blood sugar was 44 mg/dl.  Recommend decreasing Lantus to 8 units daily and continuing Novolog 0-9 units TID correction scale. Will continue to monitor blood sugars while in the hospital.  Smith Mince RN BSN CDE Diabetes Coordinator Pager: [phone]  8am-5pm

## 2017-12-15 NOTE — Progress Notes (Signed)
Inpatient Rehabilitation Admissions Coordinator  I met with patient at bedside. I discussed his alcohol intake which he minimizes as an issue. I offered Ativan prn as needed which he feels he does not need at this time. He is in agreement to admit to inpt rehab today. I have discussed with Dr. Naaman Plummer and he is aware of the previous notations by pt's RN, Sonia Baller. We can plan to admit today. I have notified RN, Sonia Baller, pt as well as RN CM, Kristi. I will make the arrangements to admit today.  Danne Baxter, RN, MSN Rehab Admissions Coordinator [phone] 12/15/2017 12:42 PM

## 2017-12-15 NOTE — Progress Notes (Addendum)
PT Cancellation Note  Patient Details Name: Jay Barber MRN: [id number] DOB: 1980/07/11   Cancelled Treatment:    Reason Eval/Treat Not Completed: Patient at procedure or test/unavailable. Currently being evaluated by MD. Will follow-up for PT treatment as schedule permits.  Ina Homes, PT, DPT Acute Rehabilitation Services  Pager [phone] Office [phone]  Malachy Chamber 12/15/2017, 11:13 AM

## 2017-12-15 NOTE — Progress Notes (Signed)
Jay Fennel, MD  Physician  Physical Medicine and Rehabilitation  Consult Note  Addendum  Date of Service: 12/14/2017 6:04 AM     Related encounter: Admission (Discharged) from 12/13/2017 in [facility] 4E CV SURGICAL PROGRESSIVE CARE       Expand All Collapse All    Show:Clear all  ManualTemplateCopied Added by:  Angiulli, Mcarthur Rossetti, PA-CPatel, Maryln Gottron, MD Hover for details                                                                             Physical Medicine and Rehabilitation Consult  Reason for Consult: Decreased functional mobility  Referring Physician: Dr. Waverly Ferrari  HPI: Jay Barber is a 37 y.o. right-handed male with history of tobacco abuse, diabetes mellitus with peripheral neuropathy, hypertension, peripheral vascular disease with history of right femoral-popliteal bypass grafting June 2019 and history of multiple right toe amputations. Per chart review, patient, and nursing, patient lives with wife and 2 teenage daughters. One level home 3 steps to entry. Wife can assist as needed. He does have family in the area. Patient independent with a knee scooter prior to admission. Presented 12/13/2017 with nonhealing right leg wound and occlusion of femoral-popliteal bypass graft. Limb was not felt to be salvageable. 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 Edilia Bo. Hospital course pain management. Acute blood loss anemia 8.3, leukocytosis 17,800. Subcutaneous heparin for DVT prophylaxis. Therapy evaluations pending. MD has requested physical medicine rehab consult.  Review of Systems  Constitutional: Negative for chills and fever.  HENT: Negative for hearing loss.  Eyes: Negative for blurred vision and double vision.  Respiratory: Positive for cough.  Cardiovascular: Positive for leg swelling. Negative for chest pain and palpitations.  Gastrointestinal: Positive for constipation.  Negative for nausea and vomiting.  GERD  Genitourinary: Negative for dysuria and hematuria.  Nocturia  Musculoskeletal: Positive for back pain and myalgias.  Skin: Negative for rash.  Psychiatric/Behavioral: Positive for depression.  Panic attacks  All other systems reviewed and are negative.        Past Medical History:  Diagnosis Date  . Anemia   . Depression   . Diabetes mellitus without complication (HCC)    takes Lantus and Novolog Type I  . Drainage from wound    right big toe  . Family history of adverse reaction to anesthesia    mother had PONV- 09/27/17 - "years ago"  . Full dentures   . GERD (gastroesophageal reflux disease)   . History of bronchitis    only had once in his life-3+yrs ago  . Hypertension   . Neuropathy associated with endocrine disorder (HCC)   . Nocturia   . Panic attacks    hx of but no meds required  . Peripheral vascular disease (HCC)   . Wears contact lenses          Past Surgical History:  Procedure Laterality Date  . ABDOMINAL AORTAGRAM N/A 11/06/2012   Procedure: ABDOMINAL Ronny Flurry; Surgeon: Chuck Hint, MD; Location: [facility] CATH LAB; Service: Cardiovascular; Laterality: N/A;  . ABDOMINAL AORTOGRAM N/A 06/03/2017   Procedure: ABDOMINAL AORTOGRAM; Surgeon: Chuck Hint, MD; Location: [facility] INVASIVE CV LAB; Service: Cardiovascular; Laterality: N/A;  . ABDOMINAL AORTOGRAM  W/LOWER EXTREMITY N/A 12/02/2017   Procedure: ABDOMINAL AORTOGRAM W/LOWER EXTREMITY; Surgeon: Chuck Hint, MD; Location: [facility] INVASIVE CV LAB; Service: Cardiovascular; Laterality: N/A;  . AMPUTATION Right 05/11/2013   Procedure: AMPUTATION DIGIT; Surgeon: Nadara Mustard, MD; Location: [facility] OR; Service: Orthopedics; Laterality: Right; Right Great Toe Amputation at MTP  . AMPUTATION Right 09/28/2017   Procedure: AMPUTATION RAY 5TH TOE POSSIBLE 4TH TOE; Surgeon: Larina Earthly, MD; Location: [facility] OR; Service: Vascular; Laterality: Right;  . APPLICATION OF WOUND VAC  Right 09/28/2017   Procedure: APPLICATION OF WOUND VAC - RIGHT FOOT; Surgeon: Larina Earthly, MD; Location: [facility] OR; Service: Vascular; Laterality: Right;  . BELOW KNEE LEG AMPUTATION Right 12/13/2017  . ESOPHAGOGASTRODUODENOSCOPY    . FEMORAL ARTERY - POPLITEAL ARTERY BYPASS GRAFT  08/09/2017  . FEMORAL-POPLITEAL BYPASS GRAFT Right 08/09/2017   Procedure: BYPASS GRAFT FEMORAL-BELOW KNEE POPLITEAL ARTERY USING X 80CM GORE PROPATEN VASCULAR GRAFT; Surgeon: Chuck Hint, MD; Location: [facility] OR; Service: Vascular; Laterality: Right;  . INTRAOPERATIVE ARTERIOGRAM Right 08/09/2017   Procedure: INTRA OPERATIVE ARTERIOGRAM RIGHT LOWER LEG; Surgeon: Chuck Hint, MD; Location: [facility] OR; Service: Vascular; Laterality: Right;  . LOWER EXTREMITY ANGIOGRAPHY Bilateral 06/03/2017   Procedure: Lower Extremity Angiography; Surgeon: Chuck Hint, MD; Location: [facility] INVASIVE CV LAB; Service: Cardiovascular; Laterality: Bilateral;  . MOUTH SURGERY    . MULTIPLE TOOTH EXTRACTIONS             Family History   Problem Relation Age of Onset   . Heart disease Father     before age 22   . Hypertension Mother    . Depression Mother    . GER disease Mother     Social History: reports that he has been smoking cigarettes. He has a 14.00 pack-year smoking history. He has never used smokeless tobacco. He reports that he drinks alcohol. He reports that he does not use drugs.  Allergies:            Allergies    Allergen Reactions    . Bupropion       Excessive fatigue                 Medications Prior to Admission    Medication Sig Dispense Refill    . ALPRAZolam (XANAX) 1 MG tablet Take 1 mg by mouth 4 (four) times daily as needed for anxiety.       Marland Kitchen aspirin 81 MG tablet Take 81 mg by mouth every evening.       . Dakins (HYSEPT) 0.25 % SOLN Apply 1 application topically 2 (two) times daily.  1    . ibuprofen (ADVIL,MOTRIN) 200 MG tablet Take 400 mg by mouth every 6 (six) hours as needed.       Marland Kitchen imipramine (TOFRANIL) 25 MG tablet Take 50 mg by mouth at bedtime.       . insulin aspart (NOVOLOG) 100 UNIT/ML injection Inject 2-4 Units into the skin 3 (three) times daily with meals. Per sliding scale      . insulin glargine (LANTUS) 100 UNIT/ML injection Inject 12 Units into the skin at bedtime.       Marland Kitchen lisinopril (PRINIVIL,ZESTRIL) 20 MG tablet Take 20 mg by mouth every evening.   5    . Melatonin 3 MG CAPS Take 6 mg by mouth at bedtime as needed (sleep).      . Multiple Vitamins-Minerals (ALIVE MENS ENERGY PO) Take 1 tablet by mouth daily.      Marland Kitchen  omeprazole (PRILOSEC OTC) 20 MG tablet Take 20 mg by mouth daily as needed (for acid reflux).      . simvastatin (ZOCOR) 20 MG tablet Take 1 tablet (20 mg total) by mouth daily at 6 PM. 30 tablet 5    . sulfamethoxazole-trimethoprim (BACTRIM DS,SEPTRA DS) 800-160 MG tablet Take 1 tablet by mouth 2 (two) times daily.      Marland Kitchen tiotropium (SPIRIVA) 18 MCG inhalation capsule Place 18 mcg into inhaler and inhale daily.      Marland Kitchen NICOTINE STEP 1 21 MG/24HR patch Place 21 mg onto the skin daily.   0     Home:  Home Living  Family/patient expects to be discharged to:: Unsure  Living Arrangements: Spouse/significant other, Children  Functional History:   Functional Status:  Mobility:      ADL:   Cognition:  Cognition  Orientation Level: Oriented X4   Blood pressure 122/70, pulse 88, temperature 98.3 F (36.8 C), temperature source Oral, resp. rate 18, SpO2 94 %.  Physical Exam  Vitals reviewed.  Constitutional: He is oriented to person, place, and time. He appears well-developed and well-nourished.  HENT:  Head: Normocephalic and atraumatic.  Eyes: EOM are normal. Right eye exhibits no discharge. Left eye exhibits no discharge.  Neck: Normal range of motion. Neck supple. No thyromegaly present.  Cardiovascular: Normal rate and regular rhythm.  Respiratory: Effort normal and breath sounds normal. No respiratory distress.  +  GI: Soft.  Bowel sounds are normal. He exhibits no distension.  Musculoskeletal:  RLE edema and tenderness  Neurological: He is alert and oriented to person, place, and time.  Motor: B/ UE 5/5 proximal to distal LLE: 4/5 proximal to distal (some pain inhibition) RLE: limited by pain Sensation diminished to light touch left foot  Skin:  Amputation site dressed. Clean and dry. Appropriately tender  Psychiatric: His speech is normal. His mood appears anxious.       Lab Results Last 24 Hours  Revision History                           Routing History

## 2017-12-15 NOTE — PMR Pre-admission (Signed)
PMR Admission Coordinator Pre-Admission Assessment  Patient: Jay Barber is an 37 y.o., male MRN: [id number] DOB: [birth year] Height:   Weight:                Insurance Information HMO:    PPO:      PCP:      IPA:      80/20:      OTHER:  PRIMARY: Medicaid [place] Access      Policy#: [id number]      Subscriber: pt Benefits:  Phone #: [phone]     Name: 12/15/17 Eff. Date: active    MAFCN  Medicaid Application Date:       Case Manager:  Disability Application Date:       Case Worker:   Emergency Contact Information Contact Information    Name Relation Home Work Mobile   [place] Spouse   [phone]   Jay, Barber Mother [phone]       Current Medical History  Patient Admitting Diagnosis: BKA  History of Present Illness: Jay Barber is a 37 year old right-handed male with history of tobacco/Alcohol abuse, diabetes mellitus with peripheral neuropathy, hypertension, peripheral vascular disease with history of right femoral-popliteal bypass grafting June 2019 and history of multiple right toe amputations.   Presented 12/13/2017 with nonhealing right leg wound and occlusion of femoral-popliteal bypass graft.  Limb was not felt to be salvageable.  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 Waverly Ferrari.  Hospital course pain management.  Acute blood loss anemia 8.4, leukocytosis 14,800.  Subcutaneous heparin for DVT prophylaxis.    On 12/15/2017, patient reportedly restless and irritable with reportedly noted with daily alcohol intake pta. Pt denies the needs for CIWA protocol. States he will let is know if needed.   Past Medical History  Past Medical History:  Diagnosis Date  . Anemia   . Depression   . Diabetes mellitus without complication (HCC)    takes Lantus and Novolog Type I  . Drainage from wound    right big toe  . Family history of adverse reaction to anesthesia    mother had PONV- 09/27/17 - "years ago"  . Full dentures   . GERD  (gastroesophageal reflux disease)   . History of bronchitis    only had once in his life-3+yrs ago  . Hypertension   . Neuropathy associated with endocrine disorder (HCC)   . Nocturia   . Panic attacks    hx of but no meds required  . Peripheral vascular disease (HCC)   . Wears contact lenses     Family History  family history includes Depression in his mother; GER disease in his mother; Heart disease in his father; Hypertension in his mother.  Prior Rehab/Hospitalizations:  Has the patient had major surgery during 100 days prior to admission? Yes  Current Medications   Current Facility-Administered Medications:  .  acetaminophen (TYLENOL) tablet 325-650 mg, 325-650 mg, Oral, Q4H PRN, 650 mg at 12/14/17 1714 **OR** acetaminophen (TYLENOL) suppository 325-650 mg, 325-650 mg, Rectal, Q4H PRN, Rhyne, Samantha J, PA-C .  ALPRAZolam (XANAX) tablet 1 mg, 1 mg, Oral, QID PRN, Rhyne, Samantha J, PA-C .  alum & mag hydroxide-simeth (MAALOX/MYLANTA) 200-200-20 MG/5ML suspension 15-30 mL, 15-30 mL, Oral, Q2H PRN, Rhyne, Samantha J, PA-C .  aspirin chewable tablet 81 mg, 81 mg, Oral, QPM, Rhyne, Samantha J, PA-C, 81 mg at 12/14/17 1822 .  bisacodyl (DULCOLAX) suppository 10 mg, 10 mg, Rectal, Daily PRN, Rhyne, Samantha J, PA-C .  ceFAZolin (ANCEF) IVPB 2g/100 mL  premix, 2 g, Intravenous, Q8H, Rhyne, Samantha J, PA-C, Last Rate: 200 mL/hr at 12/15/17 0649, 2 g at 12/15/17 0649 .  docusate sodium (COLACE) capsule 100 mg, 100 mg, Oral, Daily, Rhyne, Samantha J, PA-C, 100 mg at 12/15/17 1011 .  guaiFENesin-dextromethorphan (ROBITUSSIN DM) 100-10 MG/5ML syrup 15 mL, 15 mL, Oral, Q4H PRN, Rhyne, Samantha J, PA-C .  heparin injection 5,000 Units, 5,000 Units, Subcutaneous, Q8H, Rhyne, Samantha J, PA-C, 5,000 Units at 12/15/17 0640 .  hydrALAZINE (APRESOLINE) injection 5 mg, 5 mg, Intravenous, Q20 Min PRN, Rhyne, Samantha J, PA-C .  imipramine (TOFRANIL) tablet 50 mg, 50 mg, Oral, QHS, Rhyne, Samantha J,  PA-C, 50 mg at 12/14/17 2200 .  insulin aspart (novoLOG) injection 0-9 Units, 0-9 Units, Subcutaneous, TID WC, Rhyne, Samantha J, PA-C, 2 Units at 12/15/17 1241 .  insulin glargine (LANTUS) injection 12 Units, 12 Units, Subcutaneous, QHS, Rhyne, Samantha J, PA-C, 12 Units at 12/14/17 2200 .  labetalol (NORMODYNE,TRANDATE) injection 10 mg, 10 mg, Intravenous, Q10 min PRN, Rhyne, Samantha J, PA-C .  lisinopril (PRINIVIL,ZESTRIL) tablet 20 mg, 20 mg, Oral, QPM, Rhyne, Samantha J, PA-C, 20 mg at 12/14/17 1823 .  magnesium sulfate IVPB 2 g 50 mL, 2 g, Intravenous, Daily PRN, Rhyne, Samantha J, PA-C .  Melatonin TABS 6 mg, 6 mg, Oral, QHS PRN, Rhyne, Samantha J, PA-C, 6 mg at 12/15/17 0102 .  metoprolol tartrate (LOPRESSOR) injection 2-5 mg, 2-5 mg, Intravenous, Q2H PRN, Rhyne, Samantha J, PA-C .  morphine 4 MG/ML injection 4 mg, 4 mg, Intravenous, Q2H PRN, Rhyne, Samantha J, PA-C, 4 mg at 12/15/17 1013 .  nicotine (NICODERM CQ - dosed in mg/24 hours) patch 21 mg, 21 mg, Transdermal, Daily, Rhyne, Samantha J, PA-C .  ondansetron (ZOFRAN) injection 4 mg, 4 mg, Intravenous, Q6H PRN, Rhyne, Samantha J, PA-C .  oxyCODONE-acetaminophen (PERCOCET/ROXICET) 5-325 MG per tablet 1-2 tablet, 1-2 tablet, Oral, Q4H PRN, Rhyne, Samantha J, PA-C, 1 tablet at 12/15/17 0414 .  pantoprazole (PROTONIX) EC tablet 40 mg, 40 mg, Oral, Daily, Rhyne, Samantha J, PA-C, 40 mg at 12/15/17 1011 .  phenol (CHLORASEPTIC) mouth spray 1 spray, 1 spray, Mouth/Throat, PRN, Rhyne, Samantha J, PA-C .  polyethylene glycol (MIRALAX / GLYCOLAX) packet 17 g, 17 g, Oral, Daily PRN, Rhyne, Samantha J, PA-C .  potassium chloride SA (K-DUR,KLOR-CON) CR tablet 20-40 mEq, 20-40 mEq, Oral, Daily PRN, Rhyne, Samantha J, PA-C .  promethazine (PHENERGAN) injection 12.5 mg, 12.5 mg, Intravenous, Q4H PRN, Chuck Hint, MD, 12.5 mg at 12/14/17 1232 .  simvastatin (ZOCOR) tablet 20 mg, 20 mg, Oral, q1800, Rhyne, Samantha J, PA-C, 20 mg at 12/14/17  1821 .  spiritus frumenti (ethyl alcohol) solution 1 each, 1 each, Oral, TID WC, Rhyne, Samantha J, PA-C .  umeclidinium bromide (INCRUSE ELLIPTA) 62.5 MCG/INH 1 puff, 1 puff, Inhalation, Daily, Chuck Hint, MD, 1 puff at 12/15/17 1610  Patients Current Diet:  Diet Order            Diet Carb Modified Fluid consistency: Thin; Room service appropriate? Yes  Diet effective now              Precautions / Restrictions Precautions Precautions: Fall Restrictions Weight Bearing Restrictions: Yes RLE Weight Bearing: Non weight bearing Other Position/Activity Restrictions: right BKA   Has the patient had 2 or more falls or a fall with injury in the past year?No  Prior Activity Level Limited Community (1-2x/wk): Independent until 2 weeks ago  Journalist, newspaper / Corporate investment banker  Devices/Equipment: CBG Meter, Eyeglasses, Dentures (specify type) Home Equipment: Bedside commode, Cane - single point, Grab bars - toilet  Prior Device Use: Indicate devices/aids used by the patient prior to current illness, exacerbation or injury? knee scooter  Prior Functional Level Prior Function Level of Independence: Needs assistance ADL's / Homemaking Assistance Needed: wife had to help with adls over past 2 weeks.  LLE swollen Comments: Has assistance for IADL PRN from daughter, MIL, and wife.   Self Care: Did the patient need help bathing, dressing, using the toilet or eating?  Needed some help  Indoor Mobility: Did the patient need assistance with walking from room to room (with or without device)? Needed some help  Stairs: Did the patient need assistance with internal or external stairs (with or without device)? Needed some help  Functional Cognition: Did the patient need help planning regular tasks such as shopping or remembering to take medications? Independent  Current Functional Level Cognition  Overall Cognitive Status: Within Functional Limits for tasks  assessed Orientation Level: Oriented X4    Extremity Assessment (includes Sensation/Coordination)  Upper Extremity Assessment: Generalized weakness(4-/5)  Lower Extremity Assessment: RLE deficits/detail RLE Deficits / Details: s/p R BKA. At least anti gravity throughout. Able to perform SLR, hip abduction, and quad set    ADLs  Overall ADL's : Needs assistance/impaired Eating/Feeding: Independent Grooming: Set up, Sitting Upper Body Bathing: Set up, Sitting Lower Body Bathing: Moderate assistance, Sitting/lateral leans Upper Body Dressing : Minimal assistance, Sitting(lines) Lower Body Dressing: Moderate assistance, Sitting/lateral leans Toileting- Clothing Manipulation and Hygiene: Moderate assistance, Sitting/lateral lean General ADL Comments: pt declined standing; able to laterally lean to bil sides. Educated on safety with forward leans    Mobility  Overal bed mobility: Needs Assistance Bed Mobility: Supine to Sit Supine to sit: Supervision General bed mobility comments: oob by PT    Transfers  Overall transfer level: Needs assistance Equipment used: Rolling walker (2 wheeled) Transfers: Sit to/from Stand, [employer] Transfers Sit to Stand: Min assist, Mod assist Stand pivot transfers: Min assist General transfer comment: OOB BY PT: DECLINED OUT OF CHAIR/STANDING WITH OT    Ambulation / Gait / Stairs / Engineer, drilling / Balance Balance Overall balance assessment: Needs assistance Sitting-balance support: No upper extremity supported, Feet unsupported Sitting balance-Leahy Scale: Good Standing balance support: Bilateral upper extremity supported Standing balance-Leahy Scale: Poor    Special needs/care consideration BiPAP/CPAP n/a CPM n/a Continuous Drip IV n/a Dialysis n/a Life Vest n/a Oxygen n/a Special Bed n/a Trach Size n/a Wound Vac n/a Skin surgical site Bowel mgmt: continent Bladder mgmt: continent Diabetic mgmt yes pta Possible  need for CIWA protocol   Previous Home Environment Living Arrangements: (children are 32 and 49 years old, His Mom is also involved)  Lives With: Spouse, Family Available Help at Discharge: Family, Available 24 hours/day Type of Home: House Home Layout: One level Home Access: Stairs to enter Secretary/administrator of Steps: 3 Bathroom Shower/Tub: Hydrographic surveyor, Health visitor: Standard Bathroom Accessibility: Yes How Accessible: Accessible via walker Home Care Services: Yes Type of Home Care Services: Home PT Home Care Agency (if known): [facility] Additional Comments: put grab bars by toilet (mounted).  Tub seat is sturdy but not a bench.  Got tub seat in '14, he thinks  Discharge Living Setting Plans for Discharge Living Setting: Patient's home, Lives with (comment)(wife and 2 daughters) Type of Home at Discharge: House Discharge Home Layout: One level Discharge Home  Access: Stairs to enter Discharge Bathroom Shower/Tub: Tub/shower unit, Garment/textile technologist: Standard Discharge Bathroom Accessibility: Yes How Accessible: Accessible via walker Does the patient have any problems obtaining your medications?: No  Social/Family/Support Systems Patient Roles: Spouse, Parent Contact Information: wife, Darl Pikes Anticipated Caregiver: wife Anticipated Caregiver's Contact Information: see above Ability/Limitations of Caregiver: no limitations Caregiver Availability: 24/7 Discharge Plan Discussed with Primary Caregiver: Yes Is Caregiver In Agreement with Plan?: Yes Does Caregiver/Family have Issues with Lodging/Transportation while Pt is in Rehab?: No  Goals/Additional Needs Patient/Family Goal for Rehab: Mod I to supervision with PT and OT Expected length of stay: [facility] 7 to 10 days' but pt wants to be home in about 5 days Special Service Needs: Pt reportedly drinks daily; he states 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(Pt denies the need for ATivan  on admission when offered) Pt/Family Agrees to Admission and willing to participate: Yes Program Orientation Provided & Reviewed with Pt/Caregiver Including Roles  & Responsibilities: Yes  Decrease burden of Care through IP rehab admission: n/a  Possible need for SNF placement upon discharge: not anticipated  Patient Condition: This patient's condition remains as documented in the consult dated 12/14/2017, in which the Rehabilitation Physician determined and documented that the patient's condition is appropriate for intensive rehabilitative care in an inpatient rehabilitation facility. Will admit to inpatient rehab today.  Preadmission Screen Completed By:  Clois Dupes, 12/15/2017 12:47 PM ______________________________________________________________________   Discussed status with Dr. Riley Kill on 12/15/2017 at  1253 and received telephone approval for admission today.  Admission Coordinator:  Clois Dupes, time 9604 Date 12/15/2017

## 2017-12-15 NOTE — Progress Notes (Addendum)
Spoke with pt/s wife. Pt seemed agitated this am. Wife explained that pt drinks 12 pack of beer a day. Notified Sam Rhyne. Verbal order to start CIWA protocol. Will continue to monitor pt. Lacy Duverney

## 2017-12-15 NOTE — Progress Notes (Signed)
Standley Brooking, RN  Rehab Admission Coordinator  Physical Medicine and Rehabilitation  PMR Pre-admission  Signed  Date of Service:  12/15/2017 12:47 PM       Related encounter: Admission (Discharged) from 12/13/2017 in [facility] 4E CV SURGICAL PROGRESSIVE CARE      Signed         Show:Clear all [x] Manual[x] Template[x] Copied  Added by: [x] Standley Brooking, RN  [] Hover for details PMR Admission Coordinator Pre-Admission Assessment  Patient: Jay Barber is an 37 y.o., male MRN: [id number] DOB: 1980/09/13 Height:   Weight:                                                                                                                                                    Insurance Information HMO:    PPO:      PCP:      IPA:      80/20:      OTHER:  PRIMARY: Medicaid [place]      Policy#: [id number]      Subscriber: pt Benefits:  Phone #: [phone]     Name: 12/15/17 Eff. Date: active    MAFCN  Medicaid Application Date:       Case Manager:  Disability Application Date:       Case Worker:   Emergency Contact Information         Contact Information    Name Relation Home Work Mobile   Swaggerty,Tracy Spouse   [phone]   Joash, Tony Mother [phone]       Current Medical History  Patient Admitting Diagnosis: BKA  History of Present Illness:Jay Barber is a 37 year old right-handed male with history of tobacco/Alcoholabuse, diabetes mellitus with peripheral neuropathy, hypertension, peripheral vascular disease with history of right femoral-popliteal bypass grafting June 2019 and history of multiple right toe amputations. Presented 12/13/2017 with nonhealing right leg wound and occlusion of femoral-popliteal bypass graft. Limb was not felt to be salvageable. 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 Waverly Ferrari. Hospital course pain management. Acute blood loss anemia 8.4, leukocytosis 14,800. Subcutaneous heparin for DVT  prophylaxis.   On 12/15/2017, patient reportedly restless and irritable with reportedly noted with daily alcohol intake pta. Pt denies the needs for CIWA protocol. States he will let is know if needed.   Past Medical History      Past Medical History:  Diagnosis Date  . Anemia   . Depression   . Diabetes mellitus without complication (HCC)    takes Lantus and Novolog Type I  . Drainage from wound    right big toe  . Family history of adverse reaction to anesthesia    mother had PONV- 09/27/17 - "years ago"  . Full dentures   . GERD (gastroesophageal reflux disease)   . History of bronchitis    only had once in his life-3+yrs ago  .  Hypertension   . Neuropathy associated with endocrine disorder (HCC)   . Nocturia   . Panic attacks    hx of but no meds required  . Peripheral vascular disease (HCC)   . Wears contact lenses     Family History  family history includes Depression in his mother; GER disease in his mother; Heart disease in his father; Hypertension in his mother.  Prior Rehab/Hospitalizations:  Has the patient had major surgery during 100 days prior to admission? Yes  Current Medications   Current Facility-Administered Medications:  .  acetaminophen (TYLENOL) tablet 325-650 mg, 325-650 mg, Oral, Q4H PRN, 650 mg at 12/14/17 1714 **OR** acetaminophen (TYLENOL) suppository 325-650 mg, 325-650 mg, Rectal, Q4H PRN, Rhyne, Samantha J, PA-C .  ALPRAZolam (XANAX) tablet 1 mg, 1 mg, Oral, QID PRN, Rhyne, Samantha J, PA-C .  alum & mag hydroxide-simeth (MAALOX/MYLANTA) 200-200-20 MG/5ML suspension 15-30 mL, 15-30 mL, Oral, Q2H PRN, Rhyne, Samantha J, PA-C .  aspirin chewable tablet 81 mg, 81 mg, Oral, QPM, Rhyne, Samantha J, PA-C, 81 mg at 12/14/17 1822 .  bisacodyl (DULCOLAX) suppository 10 mg, 10 mg, Rectal, Daily PRN, Rhyne, Samantha J, PA-C .  ceFAZolin (ANCEF) IVPB 2g/100 mL premix, 2 g, Intravenous, Q8H, Rhyne, Samantha J, PA-C, Last Rate:  200 mL/hr at 12/15/17 0649, 2 g at 12/15/17 0649 .  docusate sodium (COLACE) capsule 100 mg, 100 mg, Oral, Daily, Rhyne, Samantha J, PA-C, 100 mg at 12/15/17 1011 .  guaiFENesin-dextromethorphan (ROBITUSSIN DM) 100-10 MG/5ML syrup 15 mL, 15 mL, Oral, Q4H PRN, Rhyne, Samantha J, PA-C .  heparin injection 5,000 Units, 5,000 Units, Subcutaneous, Q8H, Rhyne, Samantha J, PA-C, 5,000 Units at 12/15/17 0640 .  hydrALAZINE (APRESOLINE) injection 5 mg, 5 mg, Intravenous, Q20 Min PRN, Rhyne, Samantha J, PA-C .  imipramine (TOFRANIL) tablet 50 mg, 50 mg, Oral, QHS, Rhyne, Samantha J, PA-C, 50 mg at 12/14/17 2200 .  insulin aspart (novoLOG) injection 0-9 Units, 0-9 Units, Subcutaneous, TID WC, Rhyne, Samantha J, PA-C, 2 Units at 12/15/17 1241 .  insulin glargine (LANTUS) injection 12 Units, 12 Units, Subcutaneous, QHS, Rhyne, Samantha J, PA-C, 12 Units at 12/14/17 2200 .  labetalol (NORMODYNE,TRANDATE) injection 10 mg, 10 mg, Intravenous, Q10 min PRN, Rhyne, Samantha J, PA-C .  lisinopril (PRINIVIL,ZESTRIL) tablet 20 mg, 20 mg, Oral, QPM, Rhyne, Samantha J, PA-C, 20 mg at 12/14/17 1823 .  magnesium sulfate IVPB 2 g 50 mL, 2 g, Intravenous, Daily PRN, Rhyne, Samantha J, PA-C .  Melatonin TABS 6 mg, 6 mg, Oral, QHS PRN, Rhyne, Samantha J, PA-C, 6 mg at 12/15/17 0102 .  metoprolol tartrate (LOPRESSOR) injection 2-5 mg, 2-5 mg, Intravenous, Q2H PRN, Rhyne, Samantha J, PA-C .  morphine 4 MG/ML injection 4 mg, 4 mg, Intravenous, Q2H PRN, Rhyne, Samantha J, PA-C, 4 mg at 12/15/17 1013 .  nicotine (NICODERM CQ - dosed in mg/24 hours) patch 21 mg, 21 mg, Transdermal, Daily, Rhyne, Samantha J, PA-C .  ondansetron (ZOFRAN) injection 4 mg, 4 mg, Intravenous, Q6H PRN, Rhyne, Samantha J, PA-C .  oxyCODONE-acetaminophen (PERCOCET/ROXICET) 5-325 MG per tablet 1-2 tablet, 1-2 tablet, Oral, Q4H PRN, Rhyne, Samantha J, PA-C, 1 tablet at 12/15/17 0414 .  pantoprazole (PROTONIX) EC tablet 40 mg, 40 mg, Oral, Daily, Rhyne, Samantha  J, PA-C, 40 mg at 12/15/17 1011 .  phenol (CHLORASEPTIC) mouth spray 1 spray, 1 spray, Mouth/Throat, PRN, Rhyne, Samantha J, PA-C .  polyethylene glycol (MIRALAX / GLYCOLAX) packet 17 g, 17 g, Oral, Daily PRN, Rhyne, Samantha J, PA-C .  potassium chloride SA (K-DUR,KLOR-CON) CR tablet 20-40 mEq, 20-40 mEq, Oral, Daily PRN, Rhyne, Samantha J, PA-C .  promethazine (PHENERGAN) injection 12.5 mg, 12.5 mg, Intravenous, Q4H PRN, Chuck Hint, MD, 12.5 mg at 12/14/17 1232 .  simvastatin (ZOCOR) tablet 20 mg, 20 mg, Oral, q1800, Rhyne, Samantha J, PA-C, 20 mg at 12/14/17 1821 .  spiritus frumenti (ethyl alcohol) solution 1 each, 1 each, Oral, TID WC, Rhyne, Samantha J, PA-C .  umeclidinium bromide (INCRUSE ELLIPTA) 62.5 MCG/INH 1 puff, 1 puff, Inhalation, Daily, Chuck Hint, MD, 1 puff at 12/15/17 1610  Patients Current Diet:     Diet Order                  Diet Carb Modified Fluid consistency: Thin; Room service appropriate? Yes  Diet effective now               Precautions / Restrictions Precautions Precautions: Fall Restrictions Weight Bearing Restrictions: Yes RLE Weight Bearing: Non weight bearing Other Position/Activity Restrictions: right BKA   Has the patient had 2 or more falls or a fall with injury in the past year?No  Prior Activity Level Limited Community (1-2x/wk): Independent until 2 weeks ago  Journalist, newspaper / Equipment Home Assistive Devices/Equipment: CBG Meter, Eyeglasses, Dentures (specify type) Home Equipment: Bedside commode, Cane - single point, Grab bars - toilet  Prior Device Use: Indicate devices/aids used by the patient prior to current illness, exacerbation or injury? knee scooter  Prior Functional Level Prior Function Level of Independence: Needs assistance ADL's / Homemaking Assistance Needed: wife had to help with adls over past 2 weeks.  LLE swollen Comments: Has assistance for IADL PRN from daughter, MIL,  and wife.   Self Care: Did the patient need help bathing, dressing, using the toilet or eating?  Needed some help  Indoor Mobility: Did the patient need assistance with walking from room to room (with or without device)? Needed some help  Stairs: Did the patient need assistance with internal or external stairs (with or without device)? Needed some help  Functional Cognition: Did the patient need help planning regular tasks such as shopping or remembering to take medications? Independent  Current Functional Level Cognition  Overall Cognitive Status: Within Functional Limits for tasks assessed Orientation Level: Oriented X4    Extremity Assessment (includes Sensation/Coordination)  Upper Extremity Assessment: Generalized weakness(4-/5)  Lower Extremity Assessment: RLE deficits/detail RLE Deficits / Details: s/p R BKA. At least anti gravity throughout. Able to perform SLR, hip abduction, and quad set    ADLs  Overall ADL's : Needs assistance/impaired Eating/Feeding: Independent Grooming: Set up, Sitting Upper Body Bathing: Set up, Sitting Lower Body Bathing: Moderate assistance, Sitting/lateral leans Upper Body Dressing : Minimal assistance, Sitting(lines) Lower Body Dressing: Moderate assistance, Sitting/lateral leans Toileting- Clothing Manipulation and Hygiene: Moderate assistance, Sitting/lateral lean General ADL Comments: pt declined standing; able to laterally lean to bil sides. Educated on safety with forward leans    Mobility  Overal bed mobility: Needs Assistance Bed Mobility: Supine to Sit Supine to sit: Supervision General bed mobility comments: oob by PT    Transfers  Overall transfer level: Needs assistance Equipment used: Rolling walker (2 wheeled) Transfers: Sit to/from Stand, [employer] Transfers Sit to Stand: Min assist, Mod assist Stand pivot transfers: Min assist General transfer comment: OOB BY PT: DECLINED OUT OF CHAIR/STANDING WITH OT      Ambulation / Gait / Stairs / Engineer, drilling / Balance Balance  Overall balance assessment: Needs assistance Sitting-balance support: No upper extremity supported, Feet unsupported Sitting balance-Leahy Scale: Good Standing balance support: Bilateral upper extremity supported Standing balance-Leahy Scale: Poor    Special needs/care consideration BiPAP/CPAP n/a CPM n/a Continuous Drip IV n/a Dialysis n/a Life Vest n/a Oxygen n/a Special Bed n/a Trach Size n/a Wound Vac n/a Skin surgical site Bowel mgmt: continent Bladder mgmt: continent Diabetic mgmt yes pta Possible need for CIWA protocol   Previous Home Environment Living Arrangements: (children are 80 and 53 years old, His Mom is also involved)  Lives With: Spouse, Family Available Help at Discharge: Family, Available 24 hours/day Type of Home: House Home Layout: One level Home Access: Stairs to enter Secretary/administrator of Steps: 3 Bathroom Shower/Tub: Hydrographic surveyor, Health visitor: Standard Bathroom Accessibility: Yes How Accessible: Accessible via walker Home Care Services: Yes Type of Home Care Services: Home PT Home Care Agency (if known): [facility] Additional Comments: put grab bars by toilet (mounted).  Tub seat is sturdy but not a bench.  Got tub seat in '14, he thinks  Discharge Living Setting Plans for Discharge Living Setting: Patient's home, Lives with (comment)(wife and 2 daughters) Type of Home at Discharge: House Discharge Home Layout: One level Discharge Home Access: Stairs to enter Discharge Bathroom Shower/Tub: Tub/shower unit, Walk-in shower Discharge Bathroom Toilet: Standard Discharge Bathroom Accessibility: Yes How Accessible: Accessible via walker Does the patient have any problems obtaining your medications?: No  Social/Family/Support Systems Patient Roles: Spouse, Parent Contact Information: wife, Darl Pikes Anticipated Caregiver:  wife Anticipated Industrial/product designer Information: see above Ability/Limitations of Caregiver: no limitations Caregiver Availability: 24/7 Discharge Plan Discussed with Primary Caregiver: Yes Is Caregiver In Agreement with Plan?: Yes Does Caregiver/Family have Issues with Lodging/Transportation while Pt is in Rehab?: No  Goals/Additional Needs Patient/Family Goal for Rehab: Mod I to supervision with PT and OT Expected length of stay: [facility] 7 to 10 days' but pt wants to be home in about 5 days Special Service Needs: Pt reportedly drinks daily; he states 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(Pt denies the need for ATivan on admission when offered) Pt/Family Agrees to Admission and willing to participate: Yes Program Orientation Provided & Reviewed with Pt/Caregiver Including Roles  & Responsibilities: Yes  Decrease burden of Care through IP rehab admission: n/a  Possible need for SNF placement upon discharge: not anticipated  Patient Condition: This patient's condition remains as documented in the consult dated 12/14/2017, in which the Rehabilitation Physician determined and documented that the patient's condition is appropriate for intensive rehabilitative care in an inpatient rehabilitation facility. Will admit to inpatient rehab today.  Preadmission Screen Completed By:  Clois Dupes, 12/15/2017 12:47 PM ______________________________________________________________________   Discussed status with Dr. Riley Kill on 12/15/2017 at  1253 and received telephone approval for admission today.  Admission Coordinator:  Clois Dupes, time [id number] Date 12/15/2017           Cosigned by: Ranelle Oyster, MD at 12/15/2017 1:15 PM  Revision History

## 2017-12-15 NOTE — H&P (Signed)
Physical Medicine and Rehabilitation Admission H&P     HPI: Jay Barber is a 37 year old right-handed male with history of tobacco/Alcohol abuse, diabetes mellitus with peripheral neuropathy, hypertension, peripheral vascular disease with history of right femoral-popliteal bypass grafting June 2019 and history of multiple right toe amputations.  Per chart review patient lives with wife and 2 teenage daughters.  One level home 3 steps to entry.  Wife can assist as needed.  Patient independent with a knee scooter prior to admission.  Presented 12/13/2017 with nonhealing right leg wound and occlusion of femoral-popliteal bypass graft.  Limb was not felt to be salvageable.  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 Deitra Mayo.  Hospital course pain management.  Acute blood loss anemia 8.4, leukocytosis 14,800.  Subcutaneous heparin for DVT prophylaxis.  Therapy evaluations completed with recommendations of physical medicine rehab consult.  Patient was admitted for a comprehensive rehab program.  Review of Systems  Constitutional: Negative for chills and fever.  HENT: Negative for hearing loss.   Eyes: Negative for blurred vision and double vision.  Respiratory: Positive for cough.   Cardiovascular: Positive for leg swelling. Negative for chest pain.  Gastrointestinal: Positive for constipation. Negative for nausea.       GERD  Genitourinary:       Nocturia  Musculoskeletal: Positive for back pain and myalgias.  Skin: Negative for rash.  Psychiatric/Behavioral: Positive for depression.       Panic attacks  All other systems reviewed and are negative.  Past Medical History:  Diagnosis Date  . Anemia   . Depression   . Diabetes mellitus without complication ([place])    takes Lantus and Novolog Type I  . Drainage from wound    right big toe  . Family history of adverse reaction to anesthesia    mother had PONV- 09/27/17 - "years ago"  . Full dentures   . GERD (gastroesophageal  reflux disease)   . History of bronchitis    only had once in his life-3+yrs ago  . Hypertension   . Neuropathy associated with endocrine disorder ([place])   . Nocturia   . Panic attacks    hx of but no meds required  . Peripheral vascular disease ([employer])   . Wears contact lenses    Past Surgical History:  Procedure Laterality Date  . ABDOMINAL AORTAGRAM N/A 11/06/2012   Procedure: ABDOMINAL Maxcine Ham;  Surgeon: Angelia Mould, MD;  Location: [facility] CATH LAB;  Service: Cardiovascular;  Laterality: N/A;  . ABDOMINAL AORTOGRAM N/A 06/03/2017   Procedure: ABDOMINAL AORTOGRAM;  Surgeon: Angelia Mould, MD;  Location: [place] CV LAB;  Service: Cardiovascular;  Laterality: N/A;  . ABDOMINAL AORTOGRAM W/LOWER EXTREMITY N/A 12/02/2017   Procedure: ABDOMINAL AORTOGRAM W/LOWER EXTREMITY;  Surgeon: Angelia Mould, MD;  Location: [place] CV LAB;  Service: Cardiovascular;  Laterality: N/A;  . AMPUTATION Right 05/11/2013   Procedure: AMPUTATION DIGIT;  Surgeon: Newt Minion, MD;  Location: [place];  Service: Orthopedics;  Laterality: Right;  Right Great Toe Amputation at MTP  . AMPUTATION Right 09/28/2017   Procedure: AMPUTATION RAY 5TH TOE POSSIBLE 4TH TOE;  Surgeon: Rosetta Posner, MD;  Location: [place];  Service: Vascular;  Laterality: Right;  . AMPUTATION Right 12/13/2017   Procedure: AMPUTATION BELOW KNEE;  Surgeon: Angelia Mould, MD;  Location: [place];  Service: Vascular;  Laterality: Right;  . APPLICATION OF WOUND VAC Right 09/28/2017   Procedure: APPLICATION OF WOUND VAC - RIGHT FOOT;  Surgeon:  Rosetta Posner, MD;  Location: [facility];  Service: Vascular;  Laterality: Right;  . BELOW KNEE LEG AMPUTATION Right 12/13/2017  . ESOPHAGOGASTRODUODENOSCOPY    . FEMORAL ARTERY - POPLITEAL ARTERY BYPASS GRAFT  08/09/2017  . FEMORAL-POPLITEAL BYPASS GRAFT Right 08/09/2017   Procedure: BYPASS GRAFT FEMORAL-BELOW KNEE POPLITEAL ARTERY USING 6MM X 80CM GORE PROPATEN VASCULAR GRAFT;  Surgeon:  Angelia Mould, MD;  Location: [place];  Service: Vascular;  Laterality: Right;  . INTRAOPERATIVE ARTERIOGRAM Right 08/09/2017   Procedure: INTRA OPERATIVE ARTERIOGRAM RIGHT LOWER LEG;  Surgeon: Angelia Mould, MD;  Location: [place];  Service: Vascular;  Laterality: Right;  . LOWER EXTREMITY ANGIOGRAPHY Bilateral 06/03/2017   Procedure: Lower Extremity Angiography;  Surgeon: Angelia Mould, MD;  Location: [place] CV LAB;  Service: Cardiovascular;  Laterality: Bilateral;  . MOUTH SURGERY    . MULTIPLE TOOTH EXTRACTIONS     Family History  Problem Relation Age of Onset  . Heart disease Father        before age 12  . Hypertension Mother   . Depression Mother   . GER disease Mother    Social History:  reports that he has been smoking cigarettes. He has a 14.00 pack-year smoking history. He has never used smokeless tobacco. He reports that he drinks alcohol. He reports that he does not use drugs. Allergies:  Allergies  Allergen Reactions  . Bupropion     Excessive fatigue    Medications Prior to Admission  Medication Sig Dispense Refill  . ALPRAZolam (XANAX) 1 MG tablet Take 1 mg by mouth 4 (four) times daily as needed for anxiety.     [place] Kitchen aspirin 81 MG tablet Take 81 mg by mouth every evening.     . Dakins (HYSEPT) 0.25 % SOLN Apply 1 application topically 2 (two) times daily.  1  . ibuprofen (ADVIL,MOTRIN) 200 MG tablet Take 400 mg by mouth every 6 (six) hours as needed.    Mar[place] Kitchen imipramine (TOFRANIL) 25 MG tablet Take 50 mg by mouth at bedtime.     . insulin aspart (NOVOLOG) 100 UNIT/ML injection Inject 2-4 Units into the skin 3 (three) times daily with meals. Per sliding scale    . insulin glargine (LANTUS) 100 UNIT/ML injection Inject 12 Units into the skin at bedtime.     Mar[place] Kitchen lisinopril (PRINIVIL,ZESTRIL) 20 MG tablet Take 20 mg by mouth every evening.   5  . Melatonin 3 MG CAPS Take 6 mg by mouth at bedtime as needed (sleep).    . Multiple Vitamins-Minerals (ALIVE  MENS ENERGY PO) Take 1 tablet by mouth daily.    Mar[place] Kitchen omeprazole (PRILOSEC OTC) 20 MG tablet Take 20 mg by mouth daily as needed (for acid reflux).    . simvastatin (ZOCOR) 20 MG tablet Take 1 tablet (20 mg total) by mouth daily at 6 PM. 30 tablet 5  . sulfamethoxazole-trimethoprim (BACTRIM DS,SEPTRA DS) 800-160 MG tablet Take 1 tablet by mouth 2 (two) times daily.    Marland Kitchen tiotropium (SPIRIVA) 18 MCG inhalation capsule Place 18 mcg into inhaler and inhale daily.    Marland Kitchen NICOTINE STEP 1 21 MG/24HR patch Place 21 mg onto the skin daily.   0    Drug Regimen Review Drug regimen was reviewed and remains appropriate with no significant issues identified  Home: Home Living Family/patient expects to be discharged to:: Private residence Living Arrangements: Spouse/significant other, Children Available Help at Discharge: Family, Available 24 hours/day Type of Home: House Home Access: Stairs to  enter Entrance Stairs-Number of Steps: 3 Home Layout: One level Bathroom Shower/Tub: Tub/shower unit, Multimedia programmer: Standard Home Equipment: Bedside commode, Cane - single point, Grab bars - toilet Additional Comments: put grab bars by toilet (mounted).  Tub seat is sturdy but not a bench.  Got tub seat in '14, he thinks   Functional History: Prior Function Level of Independence: Needs assistance ADL's / Homemaking Assistance Needed: wife had to help with adls over past 2 weeks.  LLE swollen  Functional Status:  Mobility: Bed Mobility Overal bed mobility: Needs Assistance Bed Mobility: Supine to Sit Supine to sit: Supervision General bed mobility comments: oob by PT Transfers Overall transfer level: Needs assistance Equipment used: Rolling walker (2 wheeled) Transfers: Sit to/from Stand, [employer] Transfers Sit to Stand: Min assist, Mod assist Stand pivot transfers: Min assist General transfer comment: OOB BY PT: DECLINED OUT OF CHAIR/STANDING WITH OT      ADL: ADL Overall  ADL's : Needs assistance/impaired Eating/Feeding: Independent Grooming: Set up, Sitting Upper Body Bathing: Set up, Sitting Lower Body Bathing: Moderate assistance, Sitting/lateral leans Upper Body Dressing : Minimal assistance, Sitting(lines) Lower Body Dressing: Moderate assistance, Sitting/lateral leans Toileting- Clothing Manipulation and Hygiene: Moderate assistance, Sitting/lateral lean General ADL Comments: pt declined standing; able to laterally lean to bil sides. Educated on safety with forward leans  Cognition: Cognition Overall Cognitive Status: Within Functional Limits for tasks assessed Orientation Level: Oriented X4 Cognition Arousal/Alertness: Awake/alert Behavior During Therapy: WFL for tasks assessed/performed Overall Cognitive Status: Within Functional Limits for tasks assessed  Physical Exam: Blood pressure 106/74, pulse 90, temperature 98.9 F (37.2 C), temperature source Oral, resp. rate 19, SpO2 97 %. Physical Exam  Constitutional: He is oriented to person, place, and time. He appears well-nourished. He appears distressed.  HENT:  Head: Normocephalic and atraumatic.  Eyes: Pupils are equal, round, and reactive to light.  Neck: No thyromegaly present.  Cardiovascular: Normal rate and regular rhythm. Exam reveals no friction rub.  No murmur heard. Respiratory: Effort normal.  GI: Soft. He exhibits no distension. There is no tenderness.  Musculoskeletal: He exhibits edema.  Neurological: He is alert and oriented to person, place, and time. No cranial nerve deficit. Coordination normal.  Patient is cooperative. UE 5/5/ LLE 4/5 prox to distal. RLE: can lift right leg sl off bed  Skin:  BKA site is dressed appropriately tender  Psychiatric: He has a normal mood and affect. His behavior is normal.    Results for orders placed or performed during the hospital encounter of 12/13/17 (from the past 48 hour(s))  Glucose, capillary     Status: Abnormal   Collection  Time: 12/13/17  9:18 AM  Result Value Ref Range   Glucose-Capillary 162 (H) 70 - 99 mg/dL   Comment 1 Notify RN    Comment 2 Document in Chart   Glucose, capillary     Status: Abnormal   Collection Time: 12/13/17  2:19 PM  Result Value Ref Range   Glucose-Capillary 185 (H) 70 - 99 mg/dL  Glucose, capillary     Status: Abnormal   Collection Time: 12/13/17  4:15 PM  Result Value Ref Range   Glucose-Capillary 250 (H) 70 - 99 mg/dL  Glucose, capillary     Status: Abnormal   Collection Time: 12/13/17  9:25 PM  Result Value Ref Range   Glucose-Capillary 271 (H) 70 - 99 mg/dL  Basic metabolic panel     Status: Abnormal   Collection Time: 12/14/17  3:11 AM  Result Value Ref Range   Sodium 123 (L) 135 - 145 mmol/L   Potassium 4.2 3.5 - 5.1 mmol/L   Chloride 92 (L) 98 - 111 mmol/L   CO2 20 (L) 22 - 32 mmol/L   Glucose, Bld 287 (H) 70 - 99 mg/dL   BUN 19 6 - 20 mg/dL   Creatinine, Ser 1.43 (H) 0.61 - 1.24 mg/dL   Calcium 7.5 (L) 8.9 - 10.3 mg/dL   GFR calc non Af Amer >60 >60 mL/min   GFR calc Af Amer >60 >60 mL/min    Comment: (NOTE) The eGFR has been calculated using the CKD EPI equation. This calculation has not been validated in all clinical situations. eGFR's persistently <60 mL/min signify possible Chronic Kidney Disease.    Anion gap 11 5 - 15    Comment: Performed at [facility] [address], [place], [place] [address]  CBC     Status: Abnormal   Collection Time: 12/14/17  3:11 AM  Result Value Ref Range   WBC 17.8 (H) 4.0 - 10.5 K/uL   RBC 2.70 (L) 4.22 - 5.81 MIL/uL   Hemoglobin 8.3 (L) 13.0 - 17.0 g/dL   HCT 24.8 (L) 39.0 - 52.0 %   MCV 91.9 80.0 - 100.0 fL   MCH 30.7 26.0 - 34.0 pg   MCHC 33.5 30.0 - 36.0 g/dL   RDW 14.6 11.5 - 15.5 %   Platelets 374 150 - 400 K/uL   nRBC 0.0 0.0 - 0.2 %    Comment: Performed at [facility], [place] [address], [place], [place] [address]  Glucose, capillary     Status: Abnormal   Collection Time: 12/14/17  6:16 AM    Result Value Ref Range   Glucose-Capillary 267 (H) 70 - 99 mg/dL  Glucose, capillary     Status: Abnormal   Collection Time: 12/14/17 11:24 AM  Result Value Ref Range   Glucose-Capillary 105 (H) 70 - 99 mg/dL  Glucose, capillary     Status: Abnormal   Collection Time: 12/14/17  5:21 PM  Result Value Ref Range   Glucose-Capillary 67 (L) 70 - 99 mg/dL  Glucose, capillary     Status: None   Collection Time: 12/14/17  5:58 PM  Result Value Ref Range   Glucose-Capillary 74 70 - 99 mg/dL  Glucose, capillary     Status: Abnormal   Collection Time: 12/14/17  6:58 PM  Result Value Ref Range   Glucose-Capillary 107 (H) 70 - 99 mg/dL  Glucose, capillary     Status: Abnormal   Collection Time: 12/14/17  9:52 PM  Result Value Ref Range   Glucose-Capillary 155 (H) 70 - 99 mg/dL  Basic metabolic panel     Status: Abnormal   Collection Time: 12/15/17  4:31 AM  Result Value Ref Range   Sodium 125 (L) 135 - 145 mmol/L   Potassium 3.5 3.5 - 5.1 mmol/L   Chloride 97 (L) 98 - 111 mmol/L   CO2 23 22 - 32 mmol/L   Glucose, Bld 51 (L) 70 - 99 mg/dL   BUN 14 6 - 20 mg/dL   Creatinine, Ser 0.83 0.61 - 1.24 mg/dL   Calcium 7.4 (L) 8.9 - 10.3 mg/dL   GFR calc non Af Amer >60 >60 mL/min   GFR calc Af Amer >60 >60 mL/min    Comment: (NOTE) The eGFR has been calculated using the CKD EPI equation. This calculation has not been validated in all clinical situations.  eGFR's persistently <60 mL/min signify possible Chronic Kidney Disease.    Anion gap 5 5 - 15    Comment: Performed at [facility] [address], [place], [place] [address]  CBC     Status: Abnormal   Collection Time: 12/15/17  4:31 AM  Result Value Ref Range   WBC 14.8 (H) 4.0 - 10.5 K/uL   RBC 2.71 (L) 4.22 - 5.81 MIL/uL   Hemoglobin 8.4 (L) 13.0 - 17.0 g/dL   HCT 24.8 (L) 39.0 - 52.0 %   MCV 91.5 80.0 - 100.0 fL   MCH 31.0 26.0 - 34.0 pg   MCHC 33.9 30.0 - 36.0 g/dL   RDW 14.6 11.5 - 15.5 %   Platelets 338 150 - 400 K/uL    nRBC 0.0 0.0 - 0.2 %    Comment: Performed at [facility], [place] [address], [place], [place] [address]  Glucose, capillary     Status: Abnormal   Collection Time: 12/15/17  6:08 AM  Result Value Ref Range   Glucose-Capillary 44 (LL) 70 - 99 mg/dL   Comment 1 Notify RN   Glucose, capillary     Status: Abnormal   Collection Time: 12/15/17  6:42 AM  Result Value Ref Range   Glucose-Capillary 44 (LL) 70 - 99 mg/dL   Comment 1 Document in Chart   Glucose, capillary     Status: Abnormal   Collection Time: 12/15/17  7:15 AM  Result Value Ref Range   Glucose-Capillary 129 (H) 70 - 99 mg/dL   No results found.     Medical Problem List and Plan: 1.  Decreased functional mobility secondary to peripheral vascular disease with failed right femoral-popliteal bypass graft/nonhealing right leg wound.  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  DVT Prophylaxis/Anticoagulation: Subcutaneous heparin.  Monitor for any bleeding episodes 3. Pain Management: Oxycodone as needed.   -add gabapentin for phantom limb pain  -discussed edema control and tactile desensitization  4. Mood: Tofranil 50 mg nightly, Xanax 1 mg 4 times daily as needed, melatonin nightly as needed 5. Neuropsych: This patient is capable of making decisions on his own behalf. 6. Skin/Wound Care: Routine skin checks 7. Fluids/Electrolytes/Nutrition: Routine in and outs with follow-up chemistries 8.  Acute blood loss anemia.  Follow-up CBC 9.  Diabetes mellitus with peripheral neuropathy.  Hemoglobin A1c 7.1.  Lantus insulin 12 units nightly.  Check blood sugars before meals and at bedtime.  Adjust regimen as needed. 10.  Hypertension.  Lisinopril 20 mg daily.  Monitor with increased mobility 11.  Tobacco/alcohol abuse.  NicoDerm patch.  Provide counseling.  Monitor for any signs of withdrawal  -12 pack per day history? 12.  Constipation.  Laxative assistance    Post Admission Physician  Evaluation: 1. Functional deficits secondary  to right BKA. 2. Patient is admitted to receive collaborative, interdisciplinary care between the physiatrist, rehab nursing staff, and therapy team. 3. Patient's level of medical complexity and substantial therapy needs in context of that medical necessity cannot be provided at a lesser intensity of care such as a SNF. 4. Patient has experienced substantial functional loss from his/her baseline which was documented above under the "Functional History" and "Functional Status" headings.  Judging by the patient's diagnosis, physical exam, and functional history, the patient has potential for functional progress which will result in measurable gains while on inpatient rehab.  These gains will be of substantial and practical use upon discharge  in facilitating  mobility and self-care at the household level. 5. Physiatrist will provide 24 hour management of medical needs as well as oversight of the therapy plan/treatment and provide guidance as appropriate regarding the interaction of the two. 6. The Preadmission Screening has been reviewed and patient status is unchanged unless otherwise stated above. 7. 24 hour rehab nursing will assist with bladder management, bowel management, safety, skin/wound care, disease management, medication administration, pain management and patient education  and help integrate therapy concepts, techniques,education, etc. 8. PT will assess and treat for/with: Lower extremity strength, range of motion, stamina, balance, functional mobility, safety, adaptive techniques and equipment, pre-prosthetic education, pain mgt.   Goals are: mod I. 9. OT will assess and treat for/with: ADL's, functional mobility, safety, upper extremity strength, adaptive techniques and equipment, pain mgt, pre-prosthetic education.   Goals are: mod I. Therapy may proceed with showering this patient if limb is water-proofed 10. SLP will assess and treat for/with:  n/a.  Goals are: n/a. 11. Case Management and Social Worker will assess and treat for psychological issues and discharge planning. 12. Team conference will be held weekly to assess progress toward goals and to determine barriers to discharge. 13. Patient will receive at least 3 hours of therapy per day at least 5 days per week. 14. ELOS: 7 days       15. Prognosis:  excellent   I have personally performed a face to face diagnostic evaluation of this patient and formulated the key components of the plan.  Additionally, I have personally reviewed laboratory data, imaging studies, as well as relevant notes and concur with the physician assistant's documentation above.  Meredith Staggers, MD, FAAPMR    Lavon Paganini [place], PA-C 12/15/2017

## 2017-12-15 NOTE — Discharge Summary (Signed)
Discharge Summary    Jay Barber [birth year] 37 y.o. male  [id number]  Admission Date: 12/13/2017  Discharge Date: 12/15/17  Physician: Chuck Hint, *  Admission Diagnosis: nonviable tissue   HPI:   This is a 37 y.o. male who I saw in the emergency department on 11/22/2017.  The patient had originally presented with a nonhealing wound on the right foot.  On 08/09/2017 I performed a right femoral to below-knee popliteal artery bypass with a 6 mm PTFE graft.  The vein was not adequate and therefore prosthetic graft was used.  The patient subsequently had amputation of the right fifth toe.  Patient had been sent to the emergency department because of concerns that the foot was not getting better and at that time was found to have an occluded right femoropopliteal bypass graft.  It was difficult to determine when the graft had occluded.  The patient was on Bactrim at the time.  Patient had an extensive wound on the right lateral foot where he had undergone amputation.  Perfusion appears marginal.  The fourth toe was discolored.  Since I saw him in the emergency department of the wound has not changed significantly.  He has a large wound on the lateral aspect of his foot with reasonable granulation tissue.  The adjacent fourth toe is now ischemic.  He is previously had a great toe amputation on the right.  He has significant bilateral lower extremity swelling.  He does continue to take his Bactrim.  He smokes 1 PPD of cigarettes.   Hospital Course:  The patient was admitted to the hospital and taken to the operating room on 12/13/2017 and underwent: Right below the knee amputation    Findings: The tissue contracted and appeared to have reasonable perfusion.  There were no gross signs of infection.  The pt tolerated the procedure well and was transported to the PACU in good condition.   By POD 1, he was having significant pain and given that, his dressing was changes  and the wound looks fine.  There is no erythema or drainage.  CIR is consulted.   By POD 2, his pain was under much better control.  He was accepted by CIR and discharged.  The nurse reported pt being more agitated and was notified by the wife that the pt drinks a 12 pack per day.  He had a beer ordered tid with meals.   The remainder of the hospital course consisted of increasing mobilization and increasing intake of solids without difficulty.  CBC    Component Value Date/Time   WBC 14.8 (H) 12/15/2017 0431   RBC 2.71 (L) 12/15/2017 0431   HGB 8.4 (L) 12/15/2017 0431   HCT 24.8 (L) 12/15/2017 0431   PLT 338 12/15/2017 0431   MCV 91.5 12/15/2017 0431   MCH 31.0 12/15/2017 0431   MCHC 33.9 12/15/2017 0431   RDW 14.6 12/15/2017 0431   LYMPHSABS 0.8 11/22/2017 1715   MONOABS 0.8 11/22/2017 1715   EOSABS 0.1 11/22/2017 1715   BASOSABS 0.2 (H) 11/22/2017 1715    BMET    Component Value Date/Time   NA 125 (L) 12/15/2017 0431   K 3.5 12/15/2017 0431   CL 97 (L) 12/15/2017 0431   CO2 23 12/15/2017 0431   GLUCOSE 51 (L) 12/15/2017 0431   BUN 14 12/15/2017 0431   CREATININE 0.83 12/15/2017 0431   CALCIUM 7.4 (L) 12/15/2017 0431   GFRNONAA >60 12/15/2017 0431   GFRAA >60 12/15/2017 4098  Discharge Instructions    Discharge patient   Complete by:  As directed    Transfer to CIR when bed available   Discharge disposition:  [facility] Not Defined   Discharge patient date:  12/15/2017      Discharge Diagnosis:  nonviable tissue  Secondary Diagnosis: Patient Active Problem List   Diagnosis Date Noted  . Unilateral complete BKA, right, initial encounter (HCC)   . Post-operative pain   . Benign essential HTN   . Tobacco abuse   . Type 2 diabetes mellitus with peripheral neuropathy (HCC)   . Acute blood loss anemia   . Leukocytosis   . PAD (peripheral artery disease) (HCC) 08/09/2017  . Great toe amputation status, right 08/23/2016  .  Atherosclerosis of native arteries of the extremities with ulceration (HCC) 03/14/2013  . LEUKOCYTOSIS 02/02/2008  . ALCOHOL USE 02/02/2008  . HEPATIC DISEASE 02/02/2008  . SMOKER 08/23/2007  . COUGH 08/23/2007  . Type 1 diabetes mellitus (HCC) 02/01/2007  . INSOMNIA-SLEEP DISORDER-UNSPEC 02/01/2007  . URI 02/01/2007  . ANXIETY 09/29/2006  . HYPERTENSION 09/29/2006  . GERD 09/29/2006  . PULPITIS 10/15/2003  . CHRONIC APICAL PERIODONTITIS 10/15/2003   Past Medical History:  Diagnosis Date  . Anemia   . Depression   . Diabetes mellitus without complication (HCC)    takes Lantus and Novolog Type I  . Drainage from wound    right big toe  . Family history of adverse reaction to anesthesia    mother had PONV- 09/27/17 - "years ago"  . Full dentures   . GERD (gastroesophageal reflux disease)   . History of bronchitis    only had once in his life-3+yrs ago  . Hypertension   . Neuropathy associated with endocrine disorder (HCC)   . Nocturia   . Panic attacks    hx of but no meds required  . Peripheral vascular disease (HCC)   . Wears contact lenses      Allergies as of 12/15/2017      Reactions   Bupropion    Excessive fatigue       Medication List    TAKE these medications   ALIVE MENS ENERGY PO Take 1 tablet by mouth daily.   ALPRAZolam 1 MG tablet Commonly known as:  XANAX Take 1 mg by mouth 4 (four) times daily as needed for anxiety.   aspirin 81 MG tablet Take 81 mg by mouth every evening.   HYSEPT 0.25 % Soln Apply 1 application topically 2 (two) times daily.   ibuprofen 200 MG tablet Commonly known as:  ADVIL,MOTRIN Take 400 mg by mouth every 6 (six) hours as needed.   imipramine 25 MG tablet Commonly known as:  TOFRANIL Take 50 mg by mouth at bedtime.   insulin aspart 100 UNIT/ML injection Commonly known as:  novoLOG Inject 2-4 Units into the skin 3 (three) times daily with meals. Per sliding scale   insulin glargine 100 UNIT/ML  injection Commonly known as:  LANTUS Inject 12 Units into the skin at bedtime.   lisinopril 20 MG tablet Commonly known as:  PRINIVIL,ZESTRIL Take 20 mg by mouth every evening.   Melatonin 3 MG Caps Take 6 mg by mouth at bedtime as needed (sleep).   NICOTINE STEP 1 21 mg/24hr patch Generic drug:  nicotine Place 21 mg onto the skin daily.   omeprazole 20 MG tablet Commonly known as:  PRILOSEC OTC Take 20 mg by mouth daily as needed (for acid reflux).   simvastatin  20 MG tablet Commonly known as:  ZOCOR Take 1 tablet (20 mg total) by mouth daily at 6 PM.   sulfamethoxazole-trimethoprim 800-160 MG tablet Commonly known as:  BACTRIM DS,SEPTRA DS Take 1 tablet by mouth 2 (two) times daily.   tiotropium 18 MCG inhalation capsule Commonly known as:  SPIRIVA Place 18 mcg into inhaler and inhale daily.       Prescriptions given: none  Instructions: 1.  Daily dressing changes with kerlix and 4"ace wrap  Disposition: CIR  Patient's condition: is Good  Follow up: 1. Dr. Edilia Bo in 4 weeks   Doreatha Massed, PA-C [facility] [phone] 12/15/2017  2:18 PM

## 2017-12-16 ENCOUNTER — Inpatient Hospital Stay (HOSPITAL_COMMUNITY): Payer: Medicaid Other | Admitting: Occupational Therapy

## 2017-12-16 ENCOUNTER — Inpatient Hospital Stay (HOSPITAL_COMMUNITY): Payer: Medicaid Other | Admitting: Physical Therapy

## 2017-12-16 DIAGNOSIS — E8809 Other disorders of plasma-protein metabolism, not elsewhere classified: Secondary | ICD-10-CM

## 2017-12-16 DIAGNOSIS — I10 Essential (primary) hypertension: Secondary | ICD-10-CM

## 2017-12-16 DIAGNOSIS — S88111A Complete traumatic amputation at level between knee and ankle, right lower leg, initial encounter: Secondary | ICD-10-CM

## 2017-12-16 DIAGNOSIS — E871 Hypo-osmolality and hyponatremia: Secondary | ICD-10-CM

## 2017-12-16 DIAGNOSIS — E1142 Type 2 diabetes mellitus with diabetic polyneuropathy: Secondary | ICD-10-CM

## 2017-12-16 DIAGNOSIS — E1165 Type 2 diabetes mellitus with hyperglycemia: Secondary | ICD-10-CM

## 2017-12-16 DIAGNOSIS — G8918 Other acute postprocedural pain: Secondary | ICD-10-CM

## 2017-12-16 DIAGNOSIS — D62 Acute posthemorrhagic anemia: Secondary | ICD-10-CM

## 2017-12-16 DIAGNOSIS — E46 Unspecified protein-calorie malnutrition: Secondary | ICD-10-CM

## 2017-12-16 LAB — GLUCOSE, CAPILLARY
GLUCOSE-CAPILLARY: 219 mg/dL — AB (ref 70–99)
GLUCOSE-CAPILLARY: 123 mg/dL — AB (ref 70–99)
Glucose-Capillary: 100 mg/dL — ABNORMAL HIGH (ref 70–99)
Glucose-Capillary: 139 mg/dL — ABNORMAL HIGH (ref 70–99)

## 2017-12-16 LAB — COMPREHENSIVE METABOLIC PANEL
ALK PHOS: 86 U/L (ref 38–126)
ALT: 7 U/L (ref 0–44)
ANION GAP: 9 (ref 5–15)
AST: 31 U/L (ref 15–41)
Albumin: 1.7 g/dL — ABNORMAL LOW (ref 3.5–5.0)
BUN: 10 mg/dL (ref 6–20)
CALCIUM: 8.1 mg/dL — AB (ref 8.9–10.3)
CO2: 22 mmol/L (ref 22–32)
Chloride: 101 mmol/L (ref 98–111)
Creatinine, Ser: 0.67 mg/dL (ref 0.61–1.24)
GFR calc non Af Amer: >60 mL/min (ref >60)
Glucose, Bld: 97 mg/dL (ref 70–99)
Potassium: 4 mmol/L (ref 3.5–5.1)
SODIUM: 132 mmol/L — AB (ref 135–145)
Total Bilirubin: 0.3 mg/dL (ref 0.3–1.2)
Total Protein: 5.3 g/dL — ABNORMAL LOW (ref 6.5–8.1)

## 2017-12-16 LAB — CBC WITH DIFFERENTIAL/PLATELET
ABS IMMATURE GRANULOCYTES: 0.08 10*3/uL — AB (ref 0.00–0.07)
BASOS ABS: 0.1 10*3/uL (ref 0.0–0.1)
Basophils Relative: 1 %
Eosinophils Absolute: 0.1 10*3/uL (ref 0.0–0.5)
Eosinophils Relative: 1 %
HCT: 24.8 % — ABNORMAL LOW (ref 39.0–52.0)
HEMOGLOBIN: 8 g/dL — AB (ref 13.0–17.0)
Immature Granulocytes: 1 %
LYMPHS ABS: 0.7 10*3/uL (ref 0.7–4.0)
LYMPHS PCT: 8 %
MCH: 29.9 pg (ref 26.0–34.0)
MCHC: 32.3 g/dL (ref 30.0–36.0)
MCV: 92.5 fL (ref 80.0–100.0)
Monocytes Absolute: 0.5 10*3/uL (ref 0.1–1.0)
Monocytes Relative: 5 %
NEUTROS ABS: 7.5 10*3/uL (ref 1.7–7.7)
NRBC: 0 % (ref 0.0–0.2)
Neutrophils Relative %: 84 %
Platelets: 341 10*3/uL (ref 150–400)
RBC: 2.68 MIL/uL — AB (ref 4.22–5.81)
RDW: 14.8 % (ref 11.5–15.5)
WBC: 8.9 10*3/uL (ref 4.0–10.5)

## 2017-12-16 MED ORDER — PRO-STAT SUGAR FREE PO LIQD
30.0000 mL | Freq: Two times a day (BID) | ORAL | Status: DC
  Administered 2017-12-16 – 2017-12-17 (×4): 30 mL via ORAL
  Filled 2017-12-16 (×4): qty 30

## 2017-12-16 NOTE — Progress Notes (Signed)
Social Work  Social Work Assessment and Plan  Patient Details  Name: Jay Barber MRN: [id number] Date of Birth: 1980-03-18  Today's Date: 12/16/2017  Problem List:  Patient Active Problem List   Diagnosis Date Noted  . Hypoalbuminemia due to protein-calorie malnutrition (HCC)   . Hyponatremia   . Essential hypertension   . Poorly controlled type 2 diabetes mellitus with peripheral neuropathy (HCC)   . Postoperative pain   . Right below-knee amputee (HCC) 12/15/2017  . S/P below knee amputation, right (HCC)   . Unilateral complete BKA, right, initial encounter (HCC)   . Post-operative pain   . Benign essential HTN   . Tobacco abuse   . Type 2 diabetes mellitus with peripheral neuropathy (HCC)   . Acute blood loss anemia   . Leukocytosis   . PAD (peripheral artery disease) (HCC) 08/09/2017  . Great toe amputation status, right 08/23/2016  . Atherosclerosis of native arteries of the extremities with ulceration (HCC) 03/14/2013  . LEUKOCYTOSIS 02/02/2008  . ALCOHOL USE 02/02/2008  . HEPATIC DISEASE 02/02/2008  . SMOKER 08/23/2007  . COUGH 08/23/2007  . Type 1 diabetes mellitus (HCC) 02/01/2007  . INSOMNIA-SLEEP DISORDER-UNSPEC 02/01/2007  . URI 02/01/2007  . ANXIETY 09/29/2006  . HYPERTENSION 09/29/2006  . GERD 09/29/2006  . PULPITIS 10/15/2003  . CHRONIC APICAL PERIODONTITIS 10/15/2003   Past Medical History:  Past Medical History:  Diagnosis Date  . Anemia   . Depression   . Diabetes mellitus without complication (HCC)    takes Lantus and Novolog Type I  . Drainage from wound    right big toe  . Family history of adverse reaction to anesthesia    mother had PONV- 09/27/17 - "years ago"  . Full dentures   . GERD (gastroesophageal reflux disease)   . History of bronchitis    only had once in his life-3+yrs ago  . Hypertension   . Neuropathy associated with endocrine disorder (HCC)   . Nocturia   . Panic attacks    hx of but no meds required  . Peripheral  vascular disease (HCC)   . Wears contact lenses    Past Surgical History:  Past Surgical History:  Procedure Laterality Date  . ABDOMINAL AORTAGRAM N/A 11/06/2012   Procedure: ABDOMINAL Ronny Flurry;  Surgeon: Chuck Hint, MD;  Location: [facility] CATH LAB;  Service: Cardiovascular;  Laterality: N/A;  . ABDOMINAL AORTOGRAM N/A 06/03/2017   Procedure: ABDOMINAL AORTOGRAM;  Surgeon: Chuck Hint, MD;  Location: [facility] INVASIVE CV LAB;  Service: Cardiovascular;  Laterality: N/A;  . ABDOMINAL AORTOGRAM W/LOWER EXTREMITY N/A 12/02/2017   Procedure: ABDOMINAL AORTOGRAM W/LOWER EXTREMITY;  Surgeon: Chuck Hint, MD;  Location: [facility] INVASIVE CV LAB;  Service: Cardiovascular;  Laterality: N/A;  . AMPUTATION Right 05/11/2013   Procedure: AMPUTATION DIGIT;  Surgeon: Nadara Mustard, MD;  Location: [facility] OR;  Service: Orthopedics;  Laterality: Right;  Right Great Toe Amputation at MTP  . AMPUTATION Right 09/28/2017   Procedure: AMPUTATION RAY 5TH TOE POSSIBLE 4TH TOE;  Surgeon: Larina Earthly, MD;  Location: [facility] OR;  Service: Vascular;  Laterality: Right;  . AMPUTATION Right 12/13/2017   Procedure: AMPUTATION BELOW KNEE;  Surgeon: Chuck Hint, MD;  Location: [facility] OR;  Service: Vascular;  Laterality: Right;  . APPLICATION OF WOUND VAC Right 09/28/2017   Procedure: APPLICATION OF WOUND VAC - RIGHT FOOT;  Surgeon: Larina Earthly, MD;  Location: [facility] OR;  Service: Vascular;  Laterality: Right;  . BELOW KNEE LEG AMPUTATION  Right 12/13/2017  . ESOPHAGOGASTRODUODENOSCOPY    . FEMORAL ARTERY - POPLITEAL ARTERY BYPASS GRAFT  08/09/2017  . FEMORAL-POPLITEAL BYPASS GRAFT Right 08/09/2017   Procedure: BYPASS GRAFT FEMORAL-BELOW KNEE POPLITEAL ARTERY USING X 80CM GORE PROPATEN VASCULAR GRAFT;  Surgeon: Chuck Hint, MD;  Location[facility] OR;  Service: Vascular;  Laterality: Right;  . INTRAOPERATIVE ARTERIOGRAM Right 08/09/2017   Procedure: INTRA OPERATIVE ARTERIOGRAM RIGHT LOWER LEG;  Surgeon: Chuck Hint, MD;  Location: [facility] OR;  Service: Vascular;  Laterality: Right;  . LOWER EXTREMITY ANGIOGRAPHY Bilateral 06/03/2017   Procedure: Lower Extremity Angiography;  Surgeon: Chuck Hint, MD;  Location: [facility] INVASIVE CV LAB;  Service: Cardiovascular;  Laterality: Bilateral;  . MOUTH SURGERY    . MULTIPLE TOOTH EXTRACTIONS     Social History:  reports that he has been smoking cigarettes. He has a 14.00 pack-year smoking history. He has never used smokeless tobacco. He reports that he drinks alcohol. He reports that he does not use drugs.  Family / Support Systems Marital Status: Married Patient Roles: Spouse, Parent, Other (Comment)(Son) Spouse/Significant Other: French Ana [phone] Children: 15 & 51 yo daughter's Other Supports: Heywood Tokunaga [phone] Anticipated Caregiver: Wife Ability/Limitations of Caregiver: Wife doesn't work and can provide care Caregiver Availability: 24/7 Family Dynamics: Close knit family who are involved and willing to assist pt. He is one who wants to be independent and not burden others. They have his Mom and his mother in-law to assist also. 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   Abuse/Neglect Abuse/Neglect Assessment Can Be Completed: Yes Physical Abuse: Denies Verbal Abuse: Denies Sexual Abuse: Denies Exploitation of patient/patient's resources: Denies Self-Neglect: Denies  Emotional Status Pt's affect, behavior adn adjustment status: Pt is motivated to do well but has not been sleeping well and is having pain issues with his leg. He is willing to work hard in therapies and recover and get back home soon.  He is not having a good day due to sleep deprived and pain. Recent Psychosocial Issues: other health issues being managed by PCP Pyschiatric History: History of depression takes medications for this and finds they help. Do feel he would benefit from seeing neuro-psych while here for loss of limb and substance issues.  Substance Abuse History: daily drinker and tobacco aware needs to quit for health reasons will try this time. Will discuss community resources available for this  Patient / Family Perceptions, Expectations & Goals Pt/Family understanding of illness & functional limitations: Pt and wife can explain his amputation and how the MD tried to save his leg, but couldn't. He does talk with the MD daily and feels his questions and concerns are being addressed. He does want something for sleep and to make sur ehis pain meds are on time. MD and RN aware of this. Premorbid pt/family roles/activities: Husband, father, son, retiree, etc Anticipated changes in roles/activities/participation: resume Pt/family expectations/goals: Pt states: " I want to be independent by the time I leave here."  Wife states: " He is one who doesn't like to ask others for help, so he will be doing it himself."  [employer]: Other (Comment)(Active with [facility]) Premorbid Home Care/DME Agencies: Other (Comment)(has cane, knee scooter, bsc) Transportation available at  discharge: Wife and other fmaily members Resource referrals recommended: Neuropsychology, Support group (specify)  Discharge Planning Living Arrangements: Spouse/significant other, Children Support Systems: Spouse/significant other, Children, Parent, Other relatives, Friends/neighbors Type of Residence: Private residence Insurance Resources: [employer] (specify county) Surveyor, quantity Resources: [employer] Financial Screen Referred: Previously completed Living Expenses: Own Money Management: Patient, Spouse Does the patient have any problems  obtaining your medications?: No Home Management: Wife Patient/Family Preliminary Plans: Return home with wife and their daughter's who can assist if needed. Pt wants to be mod/i at least from a wheelchair level. Awaiting therapy evaluations and will work on a safe plan for him. He wants to be here short time and be home quickly.  Social Work Anticipated Follow Up Needs: HH/OP, Support Group  Clinical Impression Sleepy gentleman who is half asleep when talking with him. He is having pain issues also. He does want to do well and get home as quick as possible. His wife and family are involved and will assist. Will await therapy evaluations and make referral to neuro-psych since will benefit from seeing while here.  Lucy Chris 12/16/2017, 11:16 AM

## 2017-12-16 NOTE — Progress Notes (Signed)
Occupational Therapy Session Note  Patient Details  Name: Jay Barber MRN: [id number] Date of Birth: November 20, 1980  Today's Date: 12/16/2017 OT Individual Time: 1430-1500 OT Individual Time Calculation (min): 30 min    Short Term Goals: Week 1:  OT Short Term Goal 1 (Week 1): STG=LTG due to LOS  Skilled Therapeutic Interventions/Progress Updates:  Pt seen for OT session focusing on education on ACE wrapping residual limb and simulated shower stall transfer in prep for d/c home. Pt sitting up in w/c upon arrival, voiced pain 5/10 at surgical site, however, declined any intervention. Pt provided demonstration and education for figure-8 ACE wrap technique of residual limb. Pt able to return demonstrate with min cuing. Provided inspection mirror to pt and educated regarding purpose/use and importance of assessing and maintaining skin integrity at wound site as well as assessing dressing wrapping. Pt taken to ADL apartment total A in w/c for time and energy conservation. Demonstration provided for shower stall and tub/transfer techniques as pt has access to both at home. Pt voicing liking tub bench better for increased safety and independence. Completed transfer to tub bench and manage LE over wall with CGA. Pt returned to room at end of session, left seated in w/c with all needs in reach and chair alarm on.   Therapy Documentation Precautions:  Precautions Precautions: None Restrictions Weight Bearing Restrictions: Yes RLE Weight Bearing: Non weight bearing Other Position/Activity Restrictions: R BKA   Therapy/Group: Individual Therapy  Keysha Damewood L 12/16/2017, 3:07 PM

## 2017-12-16 NOTE — Progress Notes (Signed)
Occupational Therapy Assessment and Plan  Patient Details  Name: MACINTYRE ALEXA MRN: [id number] Date of Birth: 01/05/1981  OT Diagnosis: acute pain, muscle weakness (generalized) and swelling of limb Rehab Potential: Rehab Potential (ACUTE ONLY): Excellent ELOS: 5 days - 7 days however pt adamant on leaving by 10/22   Today's Date: 12/16/2017 OT Individual Time:  1000-1115 (75 mins)        Problem List:  Patient Active Problem List   Diagnosis Date Noted  . Hypoalbuminemia due to protein-calorie malnutrition ([place])   . Hyponatremia   . Essential hypertension   . Poorly controlled type 2 diabetes mellitus with peripheral neuropathy ([place])   . Postoperative pain   . Right below-knee amputee ([place]) 12/15/2017  . S/P below knee amputation, right ([place])   . Unilateral complete BKA, right, initial encounter ([place])   . Post-operative pain   . Benign essential HTN   . Tobacco abuse   . Type 2 diabetes mellitus with peripheral neuropathy ([facility])   . Acute blood loss anemia   . Leukocytosis   . PAD (peripheral artery disease) ([place]) 08/09/2017  . Great toe amputation status, right 08/23/2016  . Atherosclerosis of native arteries of the extremities with ulceration ([place]) 03/14/2013  . LEUKOCYTOSIS 02/02/2008  . ALCOHOL USE 02/02/2008  . HEPATIC DISEASE 02/02/2008  . SMOKER 08/23/2007  . COUGH 08/23/2007  . Type 1 diabetes mellitus ([place]) 02/01/2007  . INSOMNIA-SLEEP DISORDER-UNSPEC 02/01/2007  . URI 02/01/2007  . ANXIETY 09/29/2006  . HYPERTENSION 09/29/2006  . GERD 09/29/2006  . PULPITIS 10/15/2003  . CHRONIC APICAL PERIODONTITIS 10/15/2003    Past Medical History:  Past Medical History:  Diagnosis Date  . Anemia   . Depression   . Diabetes mellitus without complication ([place])    takes Lantus and Novolog Type I  . Drainage from wound    right big toe  . Family history of adverse reaction to anesthesia    mother had PONV- 09/27/17 - "years ago"  . Full dentures   . GERD  (gastroesophageal reflux disease)   . History of bronchitis    only had once in his life-3+yrs ago  . Hypertension   . Neuropathy associated with endocrine disorder ([place])   . Nocturia   . Panic attacks    hx of but no meds required  . Peripheral vascular disease ([place])   . Wears contact lenses    Past Surgical History:  Past Surgical History:  Procedure Laterality Date  . ABDOMINAL AORTAGRAM N/A 11/06/2012   Procedure: ABDOMINAL Maxcine Ham;  Surgeon: Angelia Mould, MD;  [facility] C[facility] LAB;  Service: Cardiovascular;  Laterality: N/A;  . ABDOMINAL AORTOGRAM N/A 06/03/2017   Procedure: ABDOMINAL AORTOGRAM;  Surgeon: Angelia Mould, MD;  Location: [facility] CV LAB;  Service: Cardiovascular;  Laterality: N/A;  . ABDOMINAL AORTOGRAM W/LOWER EXTREMITY N/A 12/02/2017   Procedure: ABDOMINAL AORTOGRAM W/LOWER EXTREMITY;  Surgeon: Angelia Mould, MD;  Location: [facility] CV LAB;  Service: Cardiovascular;  Laterality: N/A;  . AMPUTATION Right 05/11/2013   Procedure: AMPUTATION DIGIT;  Surgeon: Newt Minion, MD;  Location: [facility];  Service: Orthopedics;  Laterality: Right;  Right Great Toe Amputation at MTP  . AMPUTATION Right 09/28/2017   Procedure: AMPUTATION RAY 5TH TOE POSSIBLE 4TH TOE;  Surgeon: Rosetta Posner, MD;  Location: [facility];  Service: Vascular;  Laterality: Right;  . AMPUTATION Right 12/13/2017   Procedure: AMPUTATION BELOW KNEE;  Surgeon: Angelia Mould, MD;  Location: [facility];  Service:  Vascular;  Laterality: Right;  . APPLICATION OF WOUND VAC Right 09/28/2017   Procedure: APPLICATION OF WOUND VAC - RIGHT FOOT;  Surgeon: Rosetta Posner, MD;  Location: [facility];  Service: Vascular;  Laterality: Right;  . BELOW KNEE LEG AMPUTATION Right 12/13/2017  . ESOPHAGOGASTRODUODENOSCOPY    . FEMORAL ARTERY - POPLITEAL ARTERY BYPASS GRAFT  08/09/2017  . FEMORAL-POPLITEAL BYPASS GRAFT Right 08/09/2017   Procedure: BYPASS GRAFT FEMORAL-BELOW KNEE POPLITEAL ARTERY USING 6MM X  80CM GORE PROPATEN VASCULAR GRAFT;  Surgeon: Angelia Mould, MD;  Location: [facility];  Service: Vascular;  Laterality: Right;  . INTRAOPERATIVE ARTERIOGRAM Right 08/09/2017   Procedure: INTRA OPERATIVE ARTERIOGRAM RIGHT LOWER LEG;  Surgeon: Angelia Mould, MD;  Location: [facility];  Service: Vascular;  Laterality: Right;  . LOWER EXTREMITY ANGIOGRAPHY Bilateral 06/03/2017   Procedure: Lower Extremity Angiography;  Surgeon: Angelia Mould, MD;  Location: [facility] CV LAB;  Service: Cardiovascular;  Laterality: Bilateral;  . MOUTH SURGERY    . MULTIPLE TOOTH EXTRACTIONS      Assessment & Plan Clinical Impression: Patient is a 37 y.o. year old male with recent admission to the hospital on 12/13/2017 with nonhealing right leg wound and occlusion of femoral-popliteal bypass graft. Limb was not felt to be salvageable. 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 Deitra Mayo. Hospital course pain management. Acute blood loss anemia 8.4, leukocytosis 14,800. Subcutaneous heparin for DVT prophylaxis. Therapy evaluations completed with recommendations of physical medicine rehab consult. Patient was admitted for a comprehensive rehab program.   Patient transferred to CIR on 12/15/2017 .   History of tobacco/Alcohol abuse, diabetes mellitus with peripheral neuropathy, hypertension, peripheral vascular disease with history of right femoral-popliteal bypass grafting June 2019 and history of multiple right toe amputations.   Patient currently requires min with basic self-care skills secondary to muscle weakness, decreased cardiorespiratoy endurance and decreased standing balance and decreased balance strategies.  Prior to hospitalization, patient could complete ADL/IADLs with independent however pt req assist from wife for ADL/IADLs ~2 weeks prior to admission.   Patient will benefit from skilled intervention to decrease level of assist with basic self-care skills, increase independence with basic  self-care skills and increase level of independence with iADL prior to discharge home with care partner.  Anticipate patient will require supervision for standing tasks and functional transfers and follow up home health.  OT - End of Session Endurance Deficit: Yes Endurance Deficit Description: generalized weakness, deconditioned  OT Assessment Rehab Potential (ACUTE ONLY): Excellent OT Patient demonstrates impairments in the following area(s): Balance;Edema;Endurance;Skin Integrity OT Basic ADL's Functional Problem(s): Grooming;Bathing;Toileting;Dressing OT Advanced ADL's Functional Problem(s): none OT Transfers Functional Problem(s): Toilet;Tub/Shower OT Plan OT Intensity: Minimum of 1-2 x/day, 45 to 90 minutes OT Frequency: 5 out of 7 days OT Duration/Estimated Length of Stay: 5 days  OT Treatment/Interventions: Balance/vestibular training;DME/adaptive equipment instruction;Patient/family education;Therapeutic Activities;Wheelchair propulsion/positioning;Psychosocial support;Therapeutic Exercise;Functional mobility training;Self Care/advanced ADL retraining;UE/LE Strength taining/ROM;Discharge planning;Skin care/wound managment;UE/LE Coordination activities;Disease mangement/prevention OT Self Feeding Anticipated Outcome(s): mod I  OT Basic Self-Care Anticipated Outcome(s): mod I OT Toileting Anticipated Outcome(s): supervision  OT Bathroom Transfers Anticipated Outcome(s): supervision  OT Recommendation Patient destination: Home Equipment Recommended: To be determined  Skilled Therapeutic Intervention Pt sitting in w/c with reports of 8/10 pain and agreeable to therapy- RN notified and made aware with pt already current on all pain medications. Discussed with pt regarding PLOF, CLOF, CIR, OT role and goals in therapy with verbal comprehension. Despite gentle encouragement, pt denied bathing session however agreeable to  transferring onto TTB for simulation of shower transfer and to  perform dressing tasks. Pt able to complete with use of lateral leans for LB dressing with pt demonstrating increased difficulty leaning toward R side. Pt transferred back to w/c- pt then performed 1 grooming task standing at sink with CGA-min A at times for balance ~3 mins before requesting seated rest break.  Pt performed w/c mobility with focus on BUE strength with supervision to therapy gym. Seated EOM pt performed lateral lean tasks reaching behind back for cup in order to simulate pulling pants up/down and hygiene tasks with supervision and min VC's for UE positioning for increased leverage. Pt then performed static standing task with focus on activity tolerance with CGA with RUE support on RW- however min A req with LUE support only on RW due to decreased balance with new BKA. Pt transported back to room as mentioned above and left seated in w/c with all needs in reach- belt alarm set.   OT Evaluation Precautions/Restrictions  Precautions Precautions: None Restrictions Weight Bearing Restrictions: Yes RLE Weight Bearing: Non weight bearing Other Position/Activity Restrictions: R BKA Home Living/Prior [place] expects to be discharged to:: Private residence Living Arrangements: Spouse/significant other, Children Available Help at Discharge: Family, Available 24 hours/day Type of Home: House Home Access: Stairs to enter Technical brewer of Steps: 3 Entrance Stairs-Rails: Can reach both, Left, Right Home Layout: One level Bathroom Shower/Tub: Multimedia programmer: Standard Bathroom Accessibility: Yes Additional Comments: Pt with built in shower seat in walk in shower   Lives With: Family IADL History Homemaking Responsibilities: Yes Meal Prep Responsibility: Secondary Current License: Yes Mode of Transportation: Car Occupation: Unemployed Prior Function Level of Independence: Independent with basic ADLs, Independent with transfers,  Independent with homemaking with ambulation  Able to Take Stairs?: Yes(scooted up/down on butt) Vocation: Other (comment)(on disability) Comments: wife and daughters help as needed; especially sit><stand to/from stairs for scooting ADL ADL Grooming: Minimal assistance, Contact guard Where Assessed-Grooming: Standing at sink Upper Body Dressing: Supervision/safety Where Assessed-Upper Body Dressing: Other (Comment)(TTB) Lower Body Dressing: Contact guard Where Assessed-Lower Body Dressing: Other (Comment)(TTB) Toilet Transfer: Minimal assistance Toilet Transfer Method: Stand pivot Science writer: Energy manager: Environmental education officer Method: Education officer, environmental: Radio broadcast assistant, Grab bars Vision Baseline Vision/History: No visual deficits Patient Visual Report: No change from baseline Vision Assessment?: No apparent visual deficits Perception  Perception: Within Functional Limits Praxis Praxis: Intact Cognition Overall Cognitive Status: Within Functional Limits for tasks assessed Arousal/Alertness: Awake/alert Orientation Level: Person;Place;Situation Person: Oriented Place: Oriented Situation: Oriented Year: 2019 Month: October Day of Week: Correct Memory: Appears intact Immediate Memory Recall: Bed;Blue;Sock Memory Recall: Blue;Sock;Bed Memory Recall Sock: With Cue Memory Recall Blue: Without Cue Memory Recall Bed: With Cue Awareness: Appears intact Problem Solving: Appears intact Safety/Judgment: Appears intact Sensation Sensation Light Touch: Appears Intact (UE only) Coordination Gross Motor Movements are Fluid and Coordinated: No (pt with decreased balance following R BKA) Fine Motor Movements are Fluid and Coordinated: Yes Motor  Motor Motor: Within Functional Limits Motor - Skilled Clinical Observations: R BKA impacting mobility  Mobility  Transfers Sit to Stand: Minimal Assistance -  Patient > 75% Trunk/Postural Assessment  Cervical Assessment Cervical Assessment: Exceptions to WFL(forward head ) Thoracic Assessment Thoracic Assessment: Exceptions to WFL(rounded shoulders ) Lumbar Assessment Lumbar Assessment: Within Functional Limits Postural Control Postural Control: Deficits on evaluation  Balance Balance Static Sitting Balance Static Sitting - Balance Support: No upper  extremity supported;Feet supported Static Sitting - Level of Assistance: 6: Modified independent (Device/Increase time) Dynamic Sitting Balance Dynamic Sitting - Balance Support: No upper extremity supported;Feet supported;During functional activity Dynamic Sitting - Level of Assistance: 6: Modified independent (Device/Increase time) Static Standing Balance Static Standing - Balance Support: Bilateral upper extremity supported Static Standing - Level of Assistance: 5: Stand by assistance Dynamic Standing Balance Dynamic Standing - Balance Support: During functional activity;Right upper extremity supported Dynamic Standing - Level of Assistance: 4: Min assist Dynamic Standing - Balance Activities: Lateral lean/weight shifting;Forward lean/weight shifting Dynamic Standing - Comments: grooming tasks at sink  Extremity/Trunk Assessment RUE Assessment RUE Assessment: Within Functional Limits Active Range of Motion (AROM) Comments: Full ROM  General Strength Comments: Shoulder 4/5; elbow 5/5  LUE Assessment LUE Assessment: Within Functional Limits Active Range of Motion (AROM) Comments: full ROM  General Strength Comments: Shoulder 4/5; elbow 5/5      Refer to Care Plan for Long Term Goals  Recommendations for other services: Neuropsych and Surveyor, mining group, Stress management and Outing/community reintegration   Discharge Criteria: Patient will be discharged from OT if patient refuses treatment 3 consecutive times without medical reason, if treatment goals not met, if  there is a change in medical status, if patient makes no progress towards goals or if patient is discharged from hospital.  The above assessment, treatment plan, treatment alternatives and goals were discussed and mutually agreed upon: by patient  Hurley Cisco 12/16/2017, 12:27 PM

## 2017-12-16 NOTE — Progress Notes (Signed)
Social Work Patient ID: Jay Barber, male   DOB: 26-Dec-1980, 37 y.o.   MRN: [id number] Team feels pt will be ready on Monday and pt insistent to go then due to meeting with SSD on Tuesday. MD and PA aware and agreeable to discharge Monday after therapies.

## 2017-12-16 NOTE — Progress Notes (Signed)
Physical Therapy Assessment and Plan  Patient Details  Name: Jay Barber MRN: [id number] Date of Birth: 01-Oct-1980  PT Diagnosis: Abnormality of gait and Muscle weakness Rehab Potential: Good ELOS: would recommend 7, but pt reports the need to leave by Tuesday for a meeting he "can not miss"   Today's Date: 12/16/2017 PT Individual Time:800-915 and 1130-1200  PT Individual Time Calculation (min): 105 min    Problem List:  Patient Active Problem List   Diagnosis Date Noted  . Hypoalbuminemia due to protein-calorie malnutrition ([place])   . Hyponatremia   . Essential hypertension   . Poorly controlled type 2 diabetes mellitus with peripheral neuropathy ([place])   . Postoperative pain   . Right below-knee amputee ([place]) 12/15/2017  . S/P below knee amputation, right ([place])   . Unilateral complete BKA, right, initial encounter ([place])   . Post-operative pain   . Benign essential HTN   . Tobacco abuse   . Type 2 diabetes mellitus with peripheral neuropathy (HCC)   . Acute blood loss anemia   . Leukocytosis   . PAD (peripheral artery disease) ([place]) 08/09/2017  . Great toe amputation status, right 08/23/2016  . Atherosclerosis of native arteries of the extremities with ulceration ([place]) 03/14/2013  . LEUKOCYTOSIS 02/02/2008  . ALCOHOL USE 02/02/2008  . HEPATIC DISEASE 02/02/2008  . SMOKER 08/23/2007  . COUGH 08/23/2007  . Type 1 diabetes mellitus ([place]) 02/01/2007  . INSOMNIA-SLEEP DISORDER-UNSPEC 02/01/2007  . URI 02/01/2007  . ANXIETY 09/29/2006  . HYPERTENSION 09/29/2006  . GERD 09/29/2006  . PULPITIS 10/15/2003  . CHRONIC APICAL PERIODONTITIS 10/15/2003    Past Medical History:  Past Medical History:  Diagnosis Date  . Anemia   . Depression   . Diabetes mellitus without complication ([place])    takes Lantus and Novolog Type I  . Drainage from wound    right big toe  . Family history of adverse reaction to anesthesia    mother had PONV- 09/27/17 - "years ago"  . Full  dentures   . GERD (gastroesophageal reflux disease)   . History of bronchitis    only had once in his life-3+yrs ago  . Hypertension   . Neuropathy associated with endocrine disorder ([place])   . Nocturia   . Panic attacks    hx of but no meds required  . Peripheral vascular disease ([place])   . Wears contact lenses    Past Surgical History:  Past Surgical History:  Procedure Laterality Date  . ABDOMINAL AORTAGRAM N/A 11/06/2012   Procedure: ABDOMINAL Maxcine Ham;  Surgeon: Angelia Mould, MD;  Location: [facility] CATH LAB;  Service: Cardiovascular;  Laterality: N/A;  . ABDOMINAL AORTOGRAM N/A 06/03/2017   Procedure: ABDOMINAL AORTOGRAM;  Surgeon: Angelia Mould, MD;  Location: [facility] CV LAB;  Service: Cardiovascular;  Laterality: N/A;  . ABDOMINAL AORTOGRAM W/LOWER EXTREMITY N/A 12/02/2017   Procedure: ABDOMINAL AORTOGRAM W/LOWER EXTREMITY;  Surgeon: Angelia Mould, MD;  Location: [place] CV LAB;  Service: Cardiovascular;  Laterality: N/A;  . AMPUTATION Right 05/11/2013   Procedure: AMPUTATION DIGIT;  Surgeon: Newt Minion, MD;  Location: [facility];  Service: Orthopedics;  Laterality: Right;  Right Great Toe Amputation at MTP  . AMPUTATION Right 09/28/2017   Procedure: AMPUTATION RAY 5TH TOE POSSIBLE 4TH TOE;  Surgeon: Rosetta Posner, MD;  Location: [facility];  Service: Vascular;  Laterality: Right;  . AMPUTATION Right 12/13/2017   Procedure: AMPUTATION BELOW KNEE;  Surgeon: Angelia Mould, MD;  Location: [facility];  Service: Vascular;  Laterality: Right;  . APPLICATION OF WOUND VAC Right 09/28/2017   Procedure: APPLICATION OF WOUND VAC - RIGHT FOOT;  Surgeon: Rosetta Posner, MD;  Location: [facility];  Service: Vascular;  Laterality: Right;  . BELOW KNEE LEG AMPUTATION Right 12/13/2017  . ESOPHAGOGASTRODUODENOSCOPY    . FEMORAL ARTERY - POPLITEAL ARTERY BYPASS GRAFT  08/09/2017  . FEMORAL-POPLITEAL BYPASS GRAFT Right 08/09/2017   Procedure: BYPASS GRAFT FEMORAL-BELOW KNEE POPLITEAL  ARTERY USING 6MM X 80CM GORE PROPATEN VASCULAR GRAFT;  Surgeon: Angelia Mould, MD;  Location: [facility];  Service: Vascular;  Laterality: Right;  . INTRAOPERATIVE ARTERIOGRAM Right 08/09/2017   Procedure: INTRA OPERATIVE ARTERIOGRAM RIGHT LOWER LEG;  Surgeon: Angelia Mould, MD;  Location: [facility];  Service: Vascular;  Laterality: Right;  . LOWER EXTREMITY ANGIOGRAPHY Bilateral 06/03/2017   Procedure: Lower Extremity Angiography;  Surgeon: Angelia Mould, MD;  Location: [facility] CV LAB;  Service: Cardiovascular;  Laterality: Bilateral;  . MOUTH SURGERY    . MULTIPLE TOOTH EXTRACTIONS      Assessment & Plan Clinical Impression: [name] is a 37 year old right-handed male with history of tobacco/Alcohol abuse, diabetes mellitus with peripheral neuropathy, hypertension, peripheral vascular disease with history of right femoral-popliteal bypass grafting June 2019 and history of multiple right toe amputations. Per chart review patient lives with wife and 2 teenage daughters. One level home 3 steps to entry. Wife can assist as needed. Patient independent with a knee scooter prior to admission. Presented 12/13/2017 with nonhealing right leg wound and occlusion of femoral-popliteal bypass graft. Limb was not felt to be salvageable. 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 Deitra Mayo. Hospital course pain management. Acute blood loss anemia 8.4, leukocytosis 14,800. Subcutaneous heparin for DVT prophylaxis. Therapy evaluations completed with recommendations of physical medicine rehab consult. Patient was admitted for a comprehensive rehab program.  Patient currently requires min with mobility secondary to muscle weakness and muscle joint tightness, decreased cardiorespiratoy endurance and decreased standing balance and phantom limb sensation.  Prior to hospitalization, patient was modified independent  with mobility and lived with Family in a House home.  Home access is 3Stairs  to enter.  Patient will benefit from skilled PT intervention to maximize safe functional mobility, minimize fall risk and decrease caregiver burden for planned discharge home with 24 hour supervision.  Anticipate patient will benefit from follow up [place] at discharge.  PT - End of Session Activity Tolerance: Tolerates 10 - 20 min activity with multiple rests Endurance Deficit: Yes Endurance Deficit Description: generalized weakness, deconditioned  PT Assessment Rehab Potential (ACUTE/IP ONLY): Good PT Barriers to Discharge: Wound Care;Weight bearing restrictions PT Patient demonstrates impairments in the following area(s): Balance;Edema;Endurance;Pain PT Transfers Functional Problem(s): Bed to Chair;Car;Furniture PT Locomotion Functional Problem(s): Ambulation;Stairs PT Plan PT Intensity: Minimum of 1-2 x/day ,45 to 90 minutes PT Frequency: 5 out of 7 days PT Duration Estimated Length of Stay: would recommend 7, but pt reports the need to leave by Tuesday for a meeting he "can not miss" PT Treatment/Interventions: Ambulation/gait training;Discharge planning;DME/adaptive equipment instruction;Functional mobility training;Pain management;Psychosocial support;Therapeutic Activities;UE/LE Strength taining/ROM;Balance/vestibular training;Community reintegration;Disease management/prevention;Neuromuscular re-education;Patient/family education;Skin care/wound management;Therapeutic Exercise;Wheelchair propulsion/positioning PT Transfers Anticipated Outcome(s): ModI PT Locomotion Anticipated Outcome(s): ModI w/c and S gait PT Recommendation Recommendations for Other Services: Neuropsych consult Follow Up Recommendations: Home health PT Patient destination: Home Equipment Recommended: Wheelchair cushion (measurements);Standard walker;Wheelchair (measurements) Equipment Details: 16x16 w/c and w/c cushion  Skilled Therapeutic Intervention Session 1: Pt received laying in bed with 5/10 pain  in RLE, but  agreeable to PT services. Cognition, sensation, motor, bed mobility, transfers, locomotion, postural control, balance, and strength were assessed as described below. Pt will benefit from therapy to focus on improved activity tolerance/endurance, LE strengthening, and balance to decrease risk of falls. SPT wrapped residual limb with ace wrap to prevent swelling. Pt was able to undress/dress with supervision, minA for reaching to pull pants over hips in standing. Pt educated on phantom limb sensation, residual limb positioning for contracture prevention, edema/swelling prevention, rehab goals, fall prevention, and ELOS. LE strengthening: SLR 2x10 and sidelying hip abduction 2x10. Sidelying hip flexor stretch for flexion contracture prevention. Pt remained seated in wheelchair with chair alarm intact and all needs in reach.  Session 2: Pt received sitting in w/c with 5/10 pain in RLE and reports of feeling tired; agreeable to PT treatment. TotalA w/c transport to rehab gym for energy/time conservation. Sit>stands 2x10 for LLE strengthening, endurance, and to improve ease of transfers; first set with UE support on walker and second set with raised mat and no UE support. Reiterated the importance of hip flexor and knee extension stretching for contracture prevention. Attempted prone hip flexor stretch, but too intense so it was modified to [employer] position, which the pt tolerated well. Seated long sitting hamstring stretch by leaning forward. Supervision w/c propulsion ~556f around unit and return to room for improved stamina. Pt remained seated in w/c with chair alarm intact and all needs in reach.  PT Evaluation Precautions/Restrictions Precautions Precautions: None Restrictions Weight Bearing Restrictions: Yes RLE Weight Bearing: Non weight bearing Other Position/Activity Restrictions: R BKA General Chart Reviewed: Yes Additional Pertinent History: PVD; wounds/tropic changes on LLE PT Missed  Treatment Reason: Not applicable Response to Previous Treatment: Not applicable Family/Caregiver Present: No Vital Signs Pain Pain Assessment Pain Scale: 0-10 Pain Score: 5  Pain Type: Surgical pain Pain Location: Leg Pain Orientation: Right(R BKA) Pain Descriptors / Indicators: Aching;Discomfort;Throbbing Pain Onset: On-going Pain Intervention(s): Medication (See eMAR);Elevated extremity;Rest;Ambulation/increased activity Home Living/Prior Functioning Home Living Living Arrangements: Spouse/significant other;Children Available Help at Discharge: Family;Available 24 hours/day Type of Home: House Home Access: Stairs to enter [employer] Steps: 3 Entrance Stairs-Rails: Can reach both;Left;Right Home Layout: One level Bathroom Shower/Tub: WMultimedia programmer Standard Bathroom Accessibility: Yes Additional Comments: Pt with built in shower seat in walk in shower   Lives With: Family Prior Function Level of Independence: Independent with basic ADLs;Independent with transfers;Independent with homemaking with ambulation  Able to Take Stairs?: Yes(scooted up/down on butt) Vocation: Other (comment)(on disability) Comments: wife and daughters help as needed; especially sit><stand to/from stairs for scooting Vision/Perception  Perception Perception: Within Functional Limits Praxis Praxis: Intact  Cognition Overall Cognitive Status: Within Functional Limits for tasks assessed Arousal/Alertness: Awake/alert Orientation Level: Oriented X4 Memory: Appears intact Awareness: Appears intact Problem Solving: Appears intact Safety/Judgment: Appears intact Sensation Sensation Light Touch: Appears Intact Light Touch Impaired Details: Impaired RLE(phantom limb sensation) Proprioception: Appears Intact Coordination Gross Motor Movements are Fluid and Coordinated: Yes Fine Motor Movements are Fluid and Coordinated: Yes Motor  Motor Motor: Within Functional  Limits Motor - Skilled Clinical Observations: R BKA impacting mobility   Mobility Bed Mobility Bed Mobility: Rolling Right;Rolling Left;Supine to Sit;Sit to Supine;Left Sidelying to Sit;Sitting - Scoot to [employer] I[employer] Bed Rolling Right: Supervision/verbal cueing Rolling Left: Supervision/Verbal cueing Left Sidelying to Sit: Supervision/Verbal cueing Supine to Sit: Supervision/Verbal cueing Sitting - Scoot to Edge of Bed: Supervision/Verbal cueing Transfers Transfers: Sit to Stand;Stand to Sit;Squat Pivot Transfers;Stand Pivot  Transfers Sit to Stand: Minimal Assistance - Patient > 75%(requires multiple attempts before successful) Stand to Sit: Contact Guard/Touching assist Stand Pivot Transfers: Minimal Assistance - Patient > 75% Stand Pivot Transfer Details: Verbal cues for sequencing;Verbal cues for technique;Verbal cues for precautions/safety;Verbal cues for safe use of DME/AE Squat Pivot Transfers: Contact Guard/Touching assist;Minimal Assistance - Patient > 75% Transfer (Assistive device): Standard walker Locomotion  Gait Ambulation: Yes Gait Assistance: Contact Guard/Touching assist Gait Distance (Feet): 15 Feet Assistive device: Standard walker Gait Gait: Yes Gait Pattern: Impaired Gait Pattern: (R BKA) Stairs / Additional Locomotion Stairs: Yes Stairs Assistance: Minimal Assistance - Patient > 75%(for sit><stand) Stair Management Technique: Seated/boosting;Backwards;Forwards Number of Stairs: 3 Height of Stairs: 6 Wheelchair Mobility Wheelchair Mobility: Yes Wheelchair Assistance: Chartered loss adjuster: Both upper extremities Wheelchair Parts Management: Needs assistance Distance: 500'  Trunk/Postural Assessment  Cervical Assessment Cervical Assessment: Exceptions to WFL(forward head ) Thoracic Assessment Thoracic Assessment: Exceptions to WFL(rounded shoulders ) Lumbar Assessment Lumbar Assessment: Within Functional Limits Postural  Control Postural Control: Deficits on evaluation  Balance Balance Balance Assessed: Yes Standardized Balance Assessment Standardized Balance Assessment: Timed Up and Go Test Timed Up and Go Test TUG: Normal TUG Normal TUG (seconds): 55(standard walker; R BKA) Static Sitting Balance Static Sitting - Balance Support: No upper extremity supported;Feet supported Static Sitting - Level of Assistance: 6: Modified independent (Device/Increase time) Dynamic Sitting Balance Dynamic Sitting - Balance Support: No upper extremity supported;Feet supported;During functional activity Dynamic Sitting - Level of Assistance: 6: Modified independent (Device/Increase time) Dynamic Sitting - Balance Activities: Lateral lean/weight shifting;Forward lean/weight shifting Sitting balance - Comments: for w/c parts management and during dressing of upper body Static Standing Balance Static Standing - Balance Support: Bilateral upper extremity supported Static Standing - Level of Assistance: 5: Stand by assistance Dynamic Standing Balance Dynamic Standing - Balance Support: During functional activity;Right upper extremity supported Dynamic Standing - Level of Assistance: 4: Min assist Dynamic Standing - Balance Activities: Lateral lean/weight shifting;Forward lean/weight shifting Dynamic Standing - Comments: grooming tasks at sink  Extremity Assessment  RUE Assessment RUE Assessment: Within Functional Limits Active Range of Motion (AROM) Comments: Full ROM  General Strength Comments: Shoulder 4/5; elbow 5/5  LUE Assessment LUE Assessment: Within Functional Limits Active Range of Motion (AROM) Comments: full ROM  General Strength Comments: Shoulder 4/5; elbow 5/5  RLE Assessment RLE Assessment: Exceptions to WFL(R BKA) Active Range of Motion (AROM) Comments: decreased knee extension (-10) RLE Strength Right Hip Flexion: 3+/5 Right Hip ABduction: 3+/5 Right Hip ADduction: 3+/5 RLE Tone RLE Tone:  Hypertonic Hypertonic Details: decreased knee extension ROM d/t increased tone; risk of hip/knee flexion contractures LLE Assessment LLE Assessment: Exceptions to [facility] LLE Strength Left Hip Flexion: 4/5 Left Hip ABduction: 4/5 Left Hip ADduction: 4/5 Left Knee Flexion: 4/5 Left Knee Extension: 5/5 Left Ankle Dorsiflexion: 5/5 Left Ankle Plantar Flexion: 5/5    Refer to Care Plan for Long Term Goals  Recommendations for other services: Neuropsych  Discharge Criteria: Patient will be discharged from PT if patient refuses treatment 3 consecutive times without medical reason, if treatment goals not met, if there is a change in medical status, if patient makes no progress towards goals or if patient is discharged from hospital.  The above assessment, treatment plan, treatment alternatives and goals were discussed and mutually agreed upon: by patient  [place] Kadeshia Kasparian 12/16/2017, 12:25 PM

## 2017-12-16 NOTE — Progress Notes (Signed)
[facility] PHYSICAL MEDICINE & REHABILITATION PROGRESS NOTE  Subjective/Complaints: Patient seen laying in bed this morning.  He states he did not sleep well overnight for several reasons and has not slept in 5 days.  He does not think that he is going to be able to do therapies today.  ROS: + Right lower extremity pain.  Denies CP, SOB, nausea, vomiting, diarrhea.  Objective: Vital Signs: Blood pressure 109/71, pulse (!) 106, temperature 98.5 F (36.9 C), temperature source Oral, resp. rate 15, height 5\' 11"  (1.803 m), weight 68.8 kg, SpO2 (!) 89 %. No results found. Recent Labs    12/15/17 0431 12/16/17 0453  WBC 14.8* 8.9  HGB 8.4* 8.0*  HCT 24.8* 24.8*  PLT 338 341   Recent Labs    12/15/17 0431 12/16/17 0453  NA 125* 132*  K 3.5 4.0  CL 97* 101  CO2 23 22  GLUCOSE 51* 97  BUN 14 10  CREATININE 0.83 0.67  CALCIUM 7.4* 8.1*    Physical Exam: BP 109/71 (BP Location: Left Arm)   Pulse (!) 106   Temp 98.5 F (36.9 C) (Oral)   Resp 15   Ht 5\' 11"  (1.803 m)   Wt 68.8 kg   SpO2 (!) 89%   BMI 21.15 kg/m  Constitutional: He appears well-nourished. He appears distressed.  Vital signs reviewed HENT: Normocephalic and atraumatic.  Eyes: EOMI.  No discharge. Cardiovascular: Normal rate and regular rhythm.  No JVD. Respiratory: Effort normal.  Clear. GI: He exhibits no distension.  Bowel sounds normal. Musculoskeletal: He exhibits edema and severe tenderness.  Neurological: He is alert and oriented Motor: B/l UE 5/5 proximal to distal LLE 4/5 prox to distal, pain inhibition.  RLE: Limited due to pain Skin: BKA site with dressing C/D/I Psychiatric: He has a normal mood and affect. His behavior is normal.   Assessment/Plan: 1. Functional deficits secondary to right BKA which require 3+ hours per day of interdisciplinary therapy in a comprehensive inpatient rehab setting.  Physiatrist is providing close team supervision and 24 hour management of active medical  problems listed below.  Physiatrist and rehab team continue to assess barriers to discharge/monitor patient progress toward functional and medical goals  Care Tool:  Bathing              Bathing assist       Upper Body Dressing/Undressing Upper body dressing        Upper body assist      Lower Body Dressing/Undressing Lower body dressing      What is the patient wearing?: Hospital gown only     Lower body assist Assist for lower body dressing: Maximal Assistance - Patient 25 - 49%     Toileting Toileting    Toileting assist Assist for toileting: Moderate Assistance - Patient 50 - 74%     Transfers Chair/bed transfer  Transfers assist           Locomotion Ambulation   Ambulation assist              Walk 10 feet activity   Assist           Walk 50 feet activity   Assist           Walk 150 feet activity   Assist           Walk 10 feet on uneven surface  activity   Assist           Wheelchair  Assist               Wheelchair 50 feet with 2 turns activity    Assist            Wheelchair 150 feet activity     Assist            Medical Problem List and Plan:  1. Decreased functional mobility secondary to peripheral vascular disease with failed right femoral-popliteal bypass graft/nonhealing right leg wound. Status post right BKA 12/13/2017   Begin CIR  Notes reviewed -failed femoral bypass with significant history of tobacco abuse now in severe pain, labs reviewed 2. DVT Prophylaxis/Anticoagulation: Subcutaneous heparin. Monitor for any bleeding episodes  3. Pain Management: Oxycodone as needed.   Added gabapentin for phantom limb pain   Discussed edema control and tactile desensitization   Will require significant encouragement and behavior modification 4. Mood: Tofranil 50 mg nightly, Xanax 1 mg 4 times daily as needed, melatonin nightly as needed  5. Neuropsych: This patient  is capable of making decisions on his own behalf.  6. Skin/Wound Care: Routine skin checks  7. Fluids/Electrolytes/Nutrition: Routine in and outs  8. Acute blood loss anemia.   Hemoglobin 8.0 on 10/18  Labs ordered for Monday 9. Diabetes mellitus with peripheral neuropathy. Hemoglobin A1c 7.1. Lantus insulin 12 units nightly. Check blood sugars before meals and at bedtime. Adjust regimen as needed.   Monitor with increased mobility 10. Hypertension. Lisinopril 20 mg daily.   Monitor with increased mobility 11. Tobacco/alcohol abuse. NicoDerm patch. Provide counseling. Monitor for any signs of withdrawal  12. Constipation. Laxative assistance  13.  Hyponatremia  Sodium 132 on 10/18  Labs ordered for Monday 14.  Hypoalbuminemia  Supplement initiated on 10/18   LOS: 1 days A FACE TO FACE EVALUATION WAS PERFORMED  Antonella Upson Karis Juba 12/16/2017, 9:12 AM

## 2017-12-16 NOTE — Progress Notes (Signed)
Patient refuse Lantus SQ state his glucose reading is to low to take that amount insulin CBG 122.

## 2017-12-16 NOTE — Progress Notes (Signed)
   VASCULAR SURGERY ASSESSMENT & PLAN:   POD 3 Right BKA  Just beginning inpatient rehab.  SUBJECTIVE:   No significant pain  PHYSICAL EXAM:   Vitals:   12/15/17 1631 12/15/17 2134 12/16/17 0511 12/16/17 1410  BP: 115/80 100/61 109/71 117/79  Pulse: 89 100 (!) 106 93  Resp: 16 18 15    Temp: 98.7 F (37.1 C) 98 F (36.7 C) 98.5 F (36.9 C) 97.8 F (36.6 C)  TempSrc: Oral Oral Oral Oral  SpO2: 92% 90% (!) 89% 91%  Weight: 68.8 kg     Height: 5\' 11"  (1.803 m)      Dressing on right BKA is dry.  LABS:   Lab Results  Component Value Date   WBC 8.9 12/16/2017   HGB 8.0 (L) 12/16/2017   HCT 24.8 (L) 12/16/2017   MCV 92.5 12/16/2017   PLT 341 12/16/2017   Lab Results  Component Value Date   CREATININE 0.67 12/16/2017   Lab Results  Component Value Date   INR 0.91 08/02/2017   CBG (last 3)  Recent Labs    12/15/17 2136 12/16/17 0602 12/16/17 1125  GLUCAP 122* 100* 219*    PROBLEM LIST:    Active Problems:   S/P below knee amputation, right (HCC)   Right below-knee amputee (HCC)   Hypoalbuminemia due to protein-calorie malnutrition (HCC)   Hyponatremia   Essential hypertension   Poorly controlled type 2 diabetes mellitus with peripheral neuropathy (HCC)   Postoperative pain   CURRENT MEDS:   . aspirin  81 mg Oral QPM  . docusate sodium  100 mg Oral Daily  . feeding supplement (PRO-STAT SUGAR FREE 64)  30 mL Oral BID  . heparin  5,000 Units Subcutaneous Q8H  . imipramine  50 mg Oral QHS  . insulin aspart  0-9 Units Subcutaneous TID WC  . insulin glargine  12 Units Subcutaneous QHS  . lisinopril  20 mg Oral QPM  . nicotine  21 mg Transdermal Daily  . pantoprazole  40 mg Oral Daily  . simvastatin  20 mg Oral q1800  . umeclidinium bromide  1 puff Inhalation Daily    Waverly Ferrari Beeper: [phone] Office: [phone] 12/16/2017

## 2017-12-16 NOTE — Discharge Instructions (Signed)
Inpatient Rehab Discharge Instructions  Jay Barber Discharge date and time: No discharge date for patient encounter.   Activities/Precautions/ Functional Status: Activity: activity as tolerated Diet: diabetic diet Wound Care: keep wound clean and dry Functional status:  ___ No restrictions     ___ Walk up steps independently ___ 24/7 supervision/assistance   ___ Walk up steps with assistance ___ Intermittent supervision/assistance  ___ Bathe/dress independently ___ Walk with walker     _x__ Bathe/dress with assistance ___ Walk Independently    ___ Shower independently ___ Walk with assistance    ___ Shower with assistance ___ No alcohol     ___ Return to work/school ________  Special Instructions: No smoking or alcohol    COMMUNITY REFERRALS UPON DISCHARGE:    Home Health:   PT, OT, RN  Agency:[facility] Phone:[phone]  Date of last service:12/19/2017  Medical Equipment/Items Ordered:WHEELCHAIR & TUB BENCH  Agency/Supplier:[facility]   [phone]   GENERAL COMMUNITY RESOURCES FOR PATIENT/FAMILY: Support Groups:[employer]   My questions have been answered and I understand these instructions. I will adhere to these goals and the provided educational materials after my discharge from the hospital.  Patient/Caregiver Signature _______________________________ Date __________  Clinician Signature _______________________________________ Date __________  Please bring this form and your medication list with you to all your follow-up doctor's appointments.

## 2017-12-16 NOTE — Care Management Note (Signed)
Inpatient Rehabilitation Center Individual Statement of Services  Patient Name:  Jay Barber  Date:  12/16/2017  Welcome to the Inpatient Rehabilitation Center.  Our goal is to provide you with an individualized program based on your diagnosis and situation, designed to meet your specific needs.  With this comprehensive rehabilitation program, you will be expected to participate in at least 3 hours of rehabilitation therapies Monday-Friday, with modified therapy programming on the weekends.  Your rehabilitation program will include the following services:  Physical Therapy (PT), Occupational Therapy (OT), 24 hour per day rehabilitation nursing, Therapeutic Recreaction (TR), Neuropsychology, Case Management (Social Worker), Rehabilitation Medicine, Nutrition Services and Pharmacy Services  Weekly team conferences will be held on Wednesday to discuss your progress.  Your Social Worker will talk with you frequently to get your input and to update you on team discussions.  Team conferences with you and your family in attendance may also be held.  Expected length of stay: 5 days  Overall anticipated outcome: mod/i wheelchair level  Depending on your progress and recovery, your program may change. Your Social Worker will coordinate services and will keep you informed of any changes. Your Social Worker's name and contact numbers are listed  below.  The following services may also be recommended but are not provided by the Inpatient Rehabilitation Center:   Driving Evaluations  Home Health Rehabiltiation Services  Outpatient Rehabilitation Services    Arrangements will be made to provide these services after discharge if needed.  Arrangements include referral to agencies that provide these services.  Your insurance has been verified to be:  Medicaid Your primary doctor is:  Annye Asa  Pertinent information will be shared with your doctor and your insurance company.  Social  Worker:  Dossie Der, SW [phone] or ([phone]  Information discussed with and copy given to patient by: Lucy Chris, 12/16/2017, 10:59 AM

## 2017-12-16 NOTE — IPOC Note (Signed)
Overall Plan of Care [facility]) Patient Details Name: Jay Barber MRN: [id number] DOB: 07-08-1980  Admitting Diagnosis: Right BKA.  Hospital Problems: Active Problems:   S/P below knee amputation, right (HCC)   Right below-knee amputee (HCC)   Hypoalbuminemia due to protein-calorie malnutrition (HCC)   Hyponatremia   Essential hypertension   Poorly controlled type 2 diabetes mellitus with peripheral neuropathy (HCC)   Postoperative pain     Functional Problem List: Nursing Behavior, Bladder, Bowel, Medication Management, Pain, Safety, Skin Integrity  PT Balance, Edema, Endurance, Pain  OT Balance, Edema, Endurance, Skin Integrity  SLP    TR         Basic ADL's: OT Grooming, Bathing, Toileting, Dressing     Advanced  ADL's: OT Simple Meal Preparation     Transfers: PT Bed to Chair, Car, Occupational psychologist, Research scientist (life sciences): PT Ambulation, Stairs     Additional Impairments: OT    SLP        TR      Anticipated Outcomes Item Anticipated Outcome  Self Feeding mod I   Swallowing      Basic self-care  supervision   Toileting  supervision    Bathroom Transfers supervision   Bowel/Bladder  Cont B/B LBM 12/12/17  Transfers  ModI  Locomotion  ModI w/c and S gait  Communication     Cognition     Pain  10/10, surgical pain, will continue to asses, administered pain regimen as ordered   Safety/Judgment  Refrain from falls, injuries call light within reach, bed/chair alarm, proper footwear.    Therapy Plan: PT Intensity: Minimum of 1-2 x/day ,45 to 90 minutes PT Frequency: 5 out of 7 days PT Duration Estimated Length of Stay: would recommend 7, but pt reports the need to leave by Tuesday for a meeting he "can not miss" OT Intensity: Minimum of 1-2 x/day, 45 to 90 minutes OT Frequency: 5 out of 7 days OT Duration/Estimated Length of Stay: 5 days       Team Interventions: Nursing Interventions Patient/Family Education, Pain Management,  Medication Management, Skin Care/Wound Management  PT interventions Ambulation/gait training, Discharge planning, DME/adaptive equipment instruction, Functional mobility training, Pain management, Psychosocial support, Therapeutic Activities, UE/LE Strength taining/ROM, Warden/ranger, Community reintegration, Disease management/prevention, Neuromuscular re-education, Patient/family education, Skin care/wound management, Therapeutic Exercise, Wheelchair propulsion/positioning  OT Interventions Warden/ranger, Fish farm manager, Patient/family education, Therapeutic Activities, Wheelchair propulsion/positioning, Psychosocial support, Therapeutic Exercise, Functional mobility training, Self Care/advanced ADL retraining, UE/LE Strength taining/ROM, Discharge planning, Skin care/wound managment, UE/LE Coordination activities, Disease mangement/prevention  SLP Interventions    TR Interventions    SW/CM Interventions Discharge Planning, Psychosocial Support, Patient/Family Education   Barriers to Discharge MD  Medical stability and Weight bearing restrictions  Nursing      PT Wound Care, Weight bearing restrictions    OT      SLP      SW       Team Discharge Planning: Destination: PT-Home ,OT- Home , SLP-  Projected Follow-up: PT-Home health PT, OT-   , SLP-  Projected Equipment Needs: PT-Wheelchair cushion (measurements), Standard walker, Wheelchair (measurements), OT- To be determined, SLP-  Equipment Details: PT-16x16 w/c and w/c cushion, OT-  Patient/family involved in discharge planning: PT- Patient,  OT-Patient, SLP-   MD ELOS: 2-3 days. Medical Rehab Prognosis:  Good Assessment: 37 year old right-handed male with history of tobacco/Alcohol abuse, diabetes mellitus with peripheral neuropathy, hypertension, peripheral vascular disease with history of right femoral-popliteal bypass grafting  June 2019 and history of multiple right toe amputations.  Presented 12/13/2017 with nonhealing right leg wound and occlusion of femoral-popliteal bypass graft. Limb was not felt to be salvageable. 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 Waverly Ferrari. Hospital course pain management. Acute blood loss anemia, leukocytosis. Patient with resulting functional deficits with mobility, endurance, transfers, self-care.  Will set goals for Supervision with PT/OT.  See Team Conference Notes for weekly updates to the plan of care

## 2017-12-17 ENCOUNTER — Inpatient Hospital Stay (HOSPITAL_COMMUNITY): Payer: Medicaid Other | Admitting: Physical Therapy

## 2017-12-17 ENCOUNTER — Inpatient Hospital Stay (HOSPITAL_COMMUNITY): Payer: Medicaid Other

## 2017-12-17 LAB — GLUCOSE, CAPILLARY
GLUCOSE-CAPILLARY: 72 mg/dL (ref 70–99)
GLUCOSE-CAPILLARY: 163 mg/dL — AB (ref 70–99)
Glucose-Capillary: 55 mg/dL — ABNORMAL LOW (ref 70–99)
Glucose-Capillary: 58 mg/dL — ABNORMAL LOW (ref 70–99)
Glucose-Capillary: 175 mg/dL — ABNORMAL HIGH (ref 70–99)

## 2017-12-17 MED ORDER — GLUCOSE 40 % PO GEL
ORAL | Status: AC
  Administered 2017-12-17: 37.5 g
  Filled 2017-12-17: qty 1

## 2017-12-17 MED ORDER — INSULIN GLARGINE 100 UNIT/ML ~~LOC~~ SOLN
8.0000 [IU] | Freq: Every day | SUBCUTANEOUS | Status: DC
  Filled 2017-12-17: qty 0.08

## 2017-12-17 NOTE — Progress Notes (Signed)
Patient has informed this RN that he wants to  leave the hospital, his wife is having DKA with blood sugar of 600 and she is on her way to the hospital.Patient also said that he will leave no matter what. Patient is advised that he is not ready for discharge at this time. MD on call was notified. Patient signed AMA. Per MD advise, make sure the patient has his meds at home and that he sees his doctor right away. Charge RN made aware.

## 2017-12-17 NOTE — Progress Notes (Signed)
Patient reminded to put back on his oxygen. Patient responded that if rn keeps on reminding him about the oxygen he will rip the tube apart. Education provided.

## 2017-12-17 NOTE — Progress Notes (Signed)
Subjective: Patient denies any complaints.  Nurse reports that he has had a low blood sugar this morning and had a low oxygen saturation last night requiring oxygen.  Patient has no complaints he feels well he has not had any shortness of breath.  Objective:BP 124/81 (BP Location: Left Arm)   Pulse 99   Temp 97.6 F (36.4 C)   Resp 20   Ht 5\' 11"  (1.803 m)   Wt 68.8 kg   SpO2 97%   BMI 21.15 kg/m  SPO2 is ranged from 53-22. 37 year old male in no acute distress.  He appears older than his stated age. HEENT exam: Atraumatic, normocephalic, extractor muscles are intact.  Conjunctive are pale. Neck is supple. Chest clear to auscultation Cardiac exam S1-S2 regular Abdominal exam thin active bowel sounds, soft  Assessment and plan 1.  Right BKA December 13, 2017.  Continue inpatient rehab.  BKA likely secondary to diabetes and tobacco abuse. 2.  Pain management he continues to have some discomfort at the right BKA site.  We will continue current medications. 3.  Acute blood loss anemia hemoglobin 8.0 on 1018.  That is relatively stable. 4.  Diabetes. CBG (last 3)  Recent Labs    12/17/17 0656 12/17/17 0659 12/17/17 0731  GLUCAP 55* 58* 72   Blood sugars have been somewhat low.  Will modify insulin. 5.  Hypertension adequately controlled 6.  Constipation resolved 13.  Hyponatremia.  Will monitor on 1021

## 2017-12-17 NOTE — Progress Notes (Signed)
Occupational Therapy Session Note  Patient Details  Name: Jay Barber MRN: [id number] Date of Birth: 1981-01-06  Today's Date: 12/17/2017 OT Individual Time: 1300-1329 OT Individual Time Calculation (min): 29 min    Short Term Goals: Week 1:     Skilled Therapeutic Interventions/Progress Updates:    1;1. Pt received seated in w/c with no belt alarm on. Pt reported slipped it overhead. OT educated that belt was for safety and to make sure to call prior to getting up for staff supervision. Pt propels w/c throughout session with mod I. Pt completes squat pivot transfers throughout session with supervision and VC for w/c set up prior to transfer. Pt completes 4 standing rounds with RW of corn hole for dynamic standing balance/reaching in MOD ranges outside BOS with unilateral support on RW with CGA. At end of 4th round pt LLE gives out and OT guides hips to mat with  MOD A. Exited session with pt seated in w/c, call light in reach and all needs met. Pt verbalizes will call for staff A for mobility.   Therapy Documentation Precautions:  Precautions Precautions: None Restrictions Weight Bearing Restrictions: Yes RLE Weight Bearing: Non weight bearing Other Position/Activity Restrictions: R BKA General:   Vital Signs:   Pain:   ADL: ADL Grooming: Minimal assistance, Contact guard Where Assessed-Grooming: Standing at sink Upper Body Dressing: Supervision/safety Where Assessed-Upper Body Dressing: Other (Comment)(TTB) Lower Body Dressing: Contact guard Where Assessed-Lower Body Dressing: Other (Comment)(TTB) Toilet Transfer: Minimal assistance Toilet Transfer Method: Stand pivot Science writer: Energy manager: Environmental education officer Method: Education officer, environmental: Radio broadcast assistant, Systems analyst    Praxis   Exercises:   Other Treatments:     Therapy/Group: Individual  Therapy  Tonny Branch 12/17/2017, 1:28 PM

## 2017-12-17 NOTE — Progress Notes (Signed)
Occupational Therapy Session Note  Patient Details  Name: ABHIRAJ DOZAL MRN: [id number] Date of Birth: 11-09-1980  Today's Date: 12/17/2017 OT Individual Time: 0865-7846 OT Individual Time Calculation (min): 40 min    Short Term Goals: Week 1:     Skilled Therapeutic Interventions/Progress Updates:    1:1. Pt received seated EOB declining bathing and dressing and reporting minimal pain in residual limb but does not want medication. Pt completes all squat pivot transfer with supervision and increased time to manage w/c parts. Pt grooms seated at sink with set up. Pt completes 3x20 basketball passes ( overhead chest an bounce passe) seated without feet support to improve core and BUE strength and endurance required for BADLS with 1# wrist weights applied to BUE. Pt sits unsupported at [facility] and leans laterally on B elbows to obtain towels, fold in lap and lean opposite direction to place on target in prep for LB dressing with 1# wrist weights on for improved BUE strength. Exited session with pt seated in w/c, call light in reach and belt alarm on.   Therapy Documentation Precautions:  Precautions Precautions: None Restrictions Weight Bearing Restrictions: Yes RLE Weight Bearing: Non weight bearing Other Position/Activity Restrictions: R BKA General:   Vital Signs:   Pain: Pain Assessment Pain Scale: 0-10 Pain Score: 6  Pain Type: Surgical pain Pain Location: Leg Pain Orientation: Right Pain Descriptors / Indicators: Aching Pain Frequency: Occasional Pain Onset: On-going Pain Intervention(s): Medication (See eMAR) ADL: ADL Grooming: Minimal assistance, Contact guard Where Assessed-Grooming: Standing at sink Upper Body Dressing: Supervision/safety Where Assessed-Upper Body Dressing: Other (Comment)(TTB) Lower Body Dressing: Contact guard Where Assessed-Lower Body Dressing: Other (Comment)(TTB) Toilet Transfer: Minimal assistance Toilet Transfer Method: Stand pivot Product/process development scientist: Acupuncturist: Insurance underwriter Method: Administrator: Emergency planning/management officer, Scientist, research (medical)    Praxis   Exercises:   Other Treatments:     Therapy/Group: Individual Therapy  Shon Hale 12/17/2017, 9:26 AM

## 2017-12-17 NOTE — Progress Notes (Signed)
Patient's oxygen saturation on room air was 55% other vitals within normal range. Rn rechecked saturation with another dinamap and it was in the 60's range. Patient denies any shortness of breath or pain. Right BKA dressing dry and clean. Oxygen provided via nasal canula at 4L/min. Oxygen saturation went up to 93-95%. MD on call and RR notified. Patient also refused 12 units of Lantus and wanted 6 units instead with blood sugar of 139. MD aware of change of lantus units. Will continue to monitor patient.   Oxygen saturation at 0012 via nasal canula 96% 4L/min. BP 107/78 HR 96.

## 2017-12-17 NOTE — Progress Notes (Signed)
Physical Therapy Session Note  Patient Details  Name: Jay Barber MRN: [id number] Date of Birth: 09-18-1980  Today's Date: 12/17/2017 PT Individual Time: 1510-1608 PT Individual Time Calculation (min): 58 min   Short Term Goals: Week 1:  PT Short Term Goal 1 (Week 1): Pt will perfrom bed mobility ModI PT Short Term Goal 2 (Week 1): Pt will transfer ModI  PT Short Term Goal 3 (Week 1): Pt will ambulate 52f with standard walker and R NWB with supervision PT Short Term Goal 4 (Week 1): Pt will propel w/c 5095fwith ModI  Skilled Therapeutic Interventions/Progress Updates:    c/o 4/10 pain and reports being premedicated.  Session focus on strengthening, activity tolerance, and transfers.    Pt able to set up squat/pivot transfers from w/c with no cues, cga for actual transfer.  Sit<>supine mod I.  Pt completes extended knee extension stretch on RLE x5 minutes for hamstring length.  2x10 reps with 4# ball, ball toss with modified sit up for core strengthening and activity tolerance.  2x10 reps bilaterally (3# weight on LLE) LAQ with 3 second hold focus on control, and especially on flexion/extension stretch on RLE.  Discussed importance of full range on RLE at knee for improved prosthetic fit and use.  Rewrapped RLE residual limb and educated on re-wrapping throughout day for optimal shaping of residual limb.  UEB 2x4 minutes at random setting forward/backward for UE strength and overall cardiopulmonary endurance.  Pt returned to room at end of session and left upright in w/c, refused chair alarm, call bell in reach and needs met.   Therapy Documentation Precautions:  Precautions Precautions: None Restrictions Weight Bearing Restrictions: Yes RLE Weight Bearing: Non weight bearing Other Position/Activity Restrictions: R BKA    Therapy/Group: Individual Therapy  CaMichel Santee0/19/2019, 4:09 PM

## 2017-12-17 NOTE — Progress Notes (Signed)
Physical Therapy Session Note  Patient Details  Name: Jay Barber MRN: [id number] Date of Birth: 08-29-1980  Today's Date: 12/17/2017 PT Individual Time: 0950-1045 PT Individual Time Calculation (min): 55 min   Short Term Goals: Week 1:  PT Short Term Goal 1 (Week 1): Pt will perfrom bed mobility ModI PT Short Term Goal 2 (Week 1): Pt will transfer ModI  PT Short Term Goal 3 (Week 1): Pt will ambulate 64ft with standard walker and R NWB with supervision PT Short Term Goal 4 (Week 1): Pt will propel w/c 521ft with ModI  Skilled Therapeutic Interventions/Progress Updates: Pt presented in w/c agreeable to therapy. Pt stating some pain as residual limb just wrapped. Pt requesting to work on UB strength and endurance. Pt propelled to rehab gym supervision and performed squat pivot transfer to mat supervision. Participated in following activities: shoulder flexion, should abd, and triceps extension with 4lb barbell x 20 bilaterally, bicep curls 5lb x 20 bilaterally, chest rows with 5lb dowel and level 2 resistance bands x 20, triceps dips 2 x 10. Pt required cues for technique and proper form. While performing exercises discussed importance of wrapping limb and ROM and pt anticipates receiving prosthetic when ready. Pt returned to w/c via squat pivot and supervision and propelled to front entrance requiring x 3 breaks due to fatigue.      Therapy Documentation Precautions:  Precautions Precautions: None Restrictions Weight Bearing Restrictions: Yes RLE Weight Bearing: Non weight bearing Other Position/Activity Restrictions: R BKA General:   Vital Signs: Therapy Vitals Temp: 97.8 F (36.6 C) Temp Source: Oral Pulse Rate: (!) 105 Resp: 16 BP: 119/81 Patient Position (if appropriate): Sitting Oxygen Therapy SpO2: 90 % O2 Device: Room Air Pain: Pain Assessment Pain Scale: 0-10 Pain Score: 0-No pain Pain Type: Surgical pain Pain Location: Leg Pain Orientation: Right Pain  Descriptors / Indicators: Aching Pain Frequency: Occasional Pain Onset: On-going Pain Intervention(s): Medication (See eMAR) Mobility:   Locomotion :    Trunk/Postural Assessment :    Balance:   Exercises:   Other Treatments:      Therapy/Group: Individual Therapy  Tiena Manansala 12/17/2017, 4:34 PM

## 2017-12-17 NOTE — Progress Notes (Signed)
Hypoglycemic Event  CBG: 58  Treatment: 1 tube instant glucose  Symptoms: None  Follow-up CBG: Time:0730 CBG Result: 72 Possible Reasons for Event: unknown  Comments/MD notified: rn to notify MD    Yosiah Jasmin, [employer]

## 2017-12-18 ENCOUNTER — Inpatient Hospital Stay (HOSPITAL_COMMUNITY): Payer: Medicaid Other

## 2017-12-18 ENCOUNTER — Inpatient Hospital Stay (HOSPITAL_COMMUNITY): Payer: Medicaid Other | Admitting: Physical Therapy

## 2017-12-18 NOTE — Progress Notes (Signed)
Physical Therapy Note  Patient Details  Name: Jay Barber MRN: [id number] Date of Birth: 03-Jul-1980 Today's Date: 12/18/2017    PT aware that this patient left AMA on 10/19; at the time of his departure, per chart review, he was able to complete bed mobility with I-Mod(I), functional transfers with S, gait approximately 77ft with contract guard and RW, and was fully independent in WC mobility up to 572ft. This therapist completed final caretool as able and appropriate per information available per chart review; unable to complete further testing due to patient's AMA departure.   Nedra Hai PT, DPT, CBIS  Supplemental Physical Therapist [facility]    Pager [phone] Acute Rehab Office [phone]     12/18/2017, 3:18 PM

## 2017-12-18 NOTE — Discharge Summary (Signed)
Discharge summary job # [id number]

## 2017-12-18 NOTE — Progress Notes (Signed)
Occupational Therapy Note  Patient Details  Name: Jay Barber MRN: [id number] Date of Birth: 12-17-1980   OT aware pf pt AMA discharge 10/19 despite education from RN/staff that pt should continue to participate in rehabilitation program to reach fully independent level. Pt is able to perform functional squat pivot transfers at superversion level, UB ADLs with set up and LB ADLs with S-CGA per chart review. Unable to complete final ADL assessment d/t pt AMA departure.   [name] 12/18/2017, 4:02 PM

## 2017-12-19 ENCOUNTER — Inpatient Hospital Stay (HOSPITAL_COMMUNITY): Payer: Medicaid Other | Admitting: Occupational Therapy

## 2017-12-19 ENCOUNTER — Inpatient Hospital Stay (HOSPITAL_COMMUNITY): Payer: Medicaid Other | Admitting: Physical Therapy

## 2017-12-19 NOTE — Discharge Summary (Signed)
NAME: Jay Barber, PADGET MEDICAL RECORD [id number] ACCOUNT [id number] DATE OF BIRTH:08-22-1980 FACILITY: [facility] LOCATION: [facility] PHYSICIAN:ANKIT PATEL, MD  DISCHARGE SUMMARY  DATE OF DISCHARGE:  12/17/2017  DISCHARGE DIAGNOSES:   1.  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   2.  Acute blood loss anemia. 3.  Diabetes mellitus. 4.  Hypertension.   5.  Constipation. 6.  Hyponatremia.  HISTORY OF PRESENT ILLNESS:  This is a 37 year old _____ male, history of tobacco, alcohol abuse, diabetes mellitus, peripheral neuropathy, history of right femoral popliteal bypass grafting in 07/2017.  Per chart review lives with his wife and 2 teenage  daughters.  Independent with knee skater prior to admission.  Presented 12/13/2017 with nonhealing right leg wound and occlusion of femoral popliteal bypass graft.  The leg was felt to be nonsalvageable.  He underwent right BKA 12/13/2017 per Dr.  Edilia Bo.  HOSPITAL COURSE:  Pain management.  Acute blood loss anemia 8.4.  Leukocytosis of 14,800.  Subcutaneous heparin for DVT prophylaxis.  Therapy evaluations completed, the patient was admitted for comprehensive rehabilitation program.  PAST MEDICAL HISTORY:  See discharge diagnoses.  SOCIAL HISTORY:  Lives with spouse.  FUNCTIONAL STATUS:  Upon admission to rehab services was supervision supine to sit, minimal assist stand pivot transfers, min mod assist activities of daily living.  PHYSICAL EXAMINATION: VITAL SIGNS:  Blood pressure 106/74, pulse 90, temperature 98, respirations 19. GENERAL:  Alert male in no acute distress.  Oriented x3. HEENT:  EOMs intact. NECK:  Supple, nontender, no JVD. CARDIOVASCULAR:  Rate controlled. ABDOMEN:  Soft, nontender, good bowel sounds. LUNGS:  Clear to auscultation without wheeze. EXTREMITIES:  BKA site was dressed, appropriately tender.  REHABILITATION HOSPITAL COURSE:  The patient was admitted to inpatient rehabilitation  services.  Therapies initiated on a 3-hour daily basis, consisting of physical therapy, occupational therapy and rehabilitation nursing.  The following issues were  addressed during patient's rehabilitation stay:  Pertaining to the patient's right BKA 12/13/2017, he would follow up with vascular surgery.  Subcutaneous heparin for DVT prophylaxis.  No bleeding episodes.  Pain management with the use of oxycodone as  well as scheduled Neurontin.  Mood stabilization with Tofranil at night as well as Xanax 4 times daily as needed.    Acute blood loss anemia 8.0 and monitored.  No bleeding episodes.    Diabetes mellitus, peripheral neuropathy.  Hemoglobin A1c of 7.1.  Insulin therapy as advised.  Ongoing diabetic teaching.    Blood pressure is controlled with lisinopril.   Noted history of tobacco, alcohol abuse.  Nicoderm patch.  Counseling underway for cessation of tobacco, alcohol products.    Therapies initiated.  The patient participating nicely.  We will schedule discharge for 12/19/2017.  Supervision ambulation 15 feet modified independent, independent wheelchair mobility, supervision stand pivot transfers.  He was able to perform  functional squat pivot transfers at a supervision level for upper body ADLs with setup and bilateral lower back and body ADLs with supervision to contact guard assist.  On 12/17/2017 patient was demanding for discharge from the hospital.  It was  discussed with the patient he was not at functional level for discharge.  He signed against medical advice.   On discharge was provided medications.  MD was made aware.  AN/NUANCE D:12/18/2017 T:12/19/2017 JOB:[id number]

## 2017-12-21 ENCOUNTER — Telehealth: Payer: Self-pay | Admitting: *Deleted

## 2017-12-21 NOTE — Telephone Encounter (Signed)
I have made 3 attempts to call patient back no answer/mailbox full.

## 2017-12-21 NOTE — Telephone Encounter (Signed)
Attempted to call this patient's mother back no answer mailbox full.

## 2017-12-22 ENCOUNTER — Other Ambulatory Visit: Payer: Self-pay

## 2017-12-22 ENCOUNTER — Ambulatory Visit (INDEPENDENT_AMBULATORY_CARE_PROVIDER_SITE_OTHER): Payer: Self-pay | Admitting: Physician Assistant

## 2017-12-22 VITALS — BP 125/86 | HR 95 | Temp 98.1°F | Resp 18 | Ht 71.0 in | Wt 159.0 lb

## 2017-12-22 DIAGNOSIS — I7025 Atherosclerosis of native arteries of other extremities with ulceration: Secondary | ICD-10-CM

## 2017-12-22 DIAGNOSIS — Z89511 Acquired absence of right leg below knee: Secondary | ICD-10-CM

## 2017-12-22 DIAGNOSIS — E8770 Fluid overload, unspecified: Secondary | ICD-10-CM

## 2017-12-22 DIAGNOSIS — I739 Peripheral vascular disease, unspecified: Secondary | ICD-10-CM

## 2017-12-22 DIAGNOSIS — R600 Localized edema: Secondary | ICD-10-CM | POA: Insufficient documentation

## 2017-12-22 NOTE — Progress Notes (Signed)
Established Previous Bypass   History of Present Illness   Jay Barber is a 37 y.o. (Jan 01, 1981) male who presents with chief complaint: bilateral lower leg and abd edema.    Past surgical history significant for right femoral to popliteal bypass by Dr. Edilia Bo on 08/09/2017.  Patient also required right fifth toe amputation.  Subsequently patient had difficulty healing right fifth toe amputation and presented to the emergency department.  It was at this time that his right leg bypass was noted to be occluded.  Having had extensive infection of nonhealing surgical wound of right foot he underwent right below the knee amputation by Dr. Edilia Bo on 12/13/2017.  He returns to clinic today and states incision of right BKA stump is healing well with wound care involving daily cleansing and dry dressing changes.  The patient is mainly concerned about his extensive pitting edema of bilateral lower extremities extending up to the level of his lower abdomen.  This has been progressive over the past several weeks.  Patient is also complaining of orthopnea as well as nocturnal dyspnea.  He has no prior history of congestive heart failure.  He denies chest pain, pleuritic pain, or palpitations.  He is taking an aspirin and a statin daily.  No history of DVT.  He is a daily tobacco user.  Patient states there is been no change in urine output.  BMP performed 6 days prior demonstrates normal renal function.  Current Outpatient Medications  Medication Sig Dispense Refill  . ALPRAZolam (XANAX) 1 MG tablet Take 1 mg by mouth 4 (four) times daily as needed for anxiety.     Marland Kitchen aspirin 81 MG tablet Take 81 mg by mouth every evening.     . Dakins (HYSEPT) 0.25 % SOLN Apply 1 application topically 2 (two) times daily.  1  . ibuprofen (ADVIL,MOTRIN) 200 MG tablet Take 400 mg by mouth every 6 (six) hours as needed.    Marland Kitchen imipramine (TOFRANIL) 25 MG tablet Take 50 mg by mouth at bedtime.     . insulin aspart (NOVOLOG)  100 UNIT/ML injection Inject 2-4 Units into the skin 3 (three) times daily with meals. Per sliding scale    . insulin glargine (LANTUS) 100 UNIT/ML injection Inject 12 Units into the skin at bedtime.     Marland Kitchen lisinopril (PRINIVIL,ZESTRIL) 20 MG tablet Take 20 mg by mouth every evening.   5  . Melatonin 3 MG CAPS Take 6 mg by mouth at bedtime as needed (sleep).    . Multiple Vitamins-Minerals (ALIVE MENS ENERGY PO) Take 1 tablet by mouth daily.    Marland Kitchen NICOTINE STEP 1 21 MG/24HR patch Place 21 mg onto the skin daily.   0  . omeprazole (PRILOSEC OTC) 20 MG tablet Take 20 mg by mouth daily as needed (for acid reflux).    . simvastatin (ZOCOR) 20 MG tablet Take 1 tablet (20 mg total) by mouth daily at 6 PM. 30 tablet 5  . sulfamethoxazole-trimethoprim (BACTRIM DS,SEPTRA DS) 800-160 MG tablet Take 1 tablet by mouth 2 (two) times daily.    Marland Kitchen tiotropium (SPIRIVA) 18 MCG inhalation capsule Place 18 mcg into inhaler and inhale daily.     No current facility-administered medications for this visit.     On ROS today: 10 system ROS is negative unless otherwise noted in HPI   Physical Examination   Vitals:   12/22/17 1522  BP: 125/86  Pulse: 95  Resp: 18  Temp: 98.1 F (36.7 C)  TempSrc:  Oral  SpO2: 100%  Weight: 159 lb (72.1 kg)  Height: 5\' 11"  (1.803 m)   Body mass index is 22.18 kg/m.  General Alert, O x 3, WD, NAD  Pulmonary Sym exp, good B air movt, Diminished air movement in bilateral lower lung fields however no crackles or rales  Cardiac Possible extra heart sound however exam difficult due to tachycardia,   Vascular Vessel Right Left  Radial Palpable Palpable  Brachial Palpable Palpable  Popliteal Not palpable Not palpable  PT BKA no signal by doppler  DP BKA brisk signal by doppler    Gastro- intestinal soft, Pitting edema lower abdomen and waist   Musculo- skeletal M/S 5/5 throughout  , Extremities without ischemic changes  , Pitting edema present: Bilateral lower extremities  extending up to waist and lower abdomen, No visible varicosities , No Lipodermatosclerosis present; right BKA incision remains intact without drainage with viable skin edges and skin edges approximated well with staples  Neurologic Pain and light touch intact in extremities , Motor exam as listed above    Arteriogram performed 12/02/2017 demonstrates a patent left common femoral, superficial femoral.  Diffusely diseased popliteal artery with patent peroneal, occluded posterior tibial artery, early occlusion of anterior tibial artery with reconstitution mid and distally with two-vessel runoff to the foot  Medical Decision Making   Jay Barber is a 37 y.o. male who presents with progressive bilateral lower extremity pitting edema now with orthopnea and nocturnal dyspnea   Right BKA healing well with staples intact despite obvious edema  Continue current wound care for right BKA and follow-up as scheduled with Dr. Edilia Bo on 01/11/2018  Based on exam and HPI patient clearly is fluid overloaded  Unlikely related to kidney function as renal function was normal as of 6 days ago  Differential includes cardiac etiology and less likely lower extremity venous outflow obstruction  Plan will be for echocardiogram and bilateral ileo-caval venous duplex to be performed at [facility] per patient request  I will notify patient's primary care provider nurse practitioner Dulce Sellar of [facility] health internal medicine for further management of fluid status  Emilie Rutter PA-C [facility] Office: [phone]

## 2017-12-28 ENCOUNTER — Telehealth: Payer: Self-pay | Admitting: Vascular Surgery

## 2017-12-28 NOTE — Telephone Encounter (Addendum)
[employer], OT/L called and left a voice message saying pt is about 40 lbs heavier than before and has 3+ pitting edema above his Pelvic bone that is starting to weep out of one spot.  He is very concerned and feels pt should head to the ED.  I tried calling back and left msg on voice mail to send pt to the ED.  I also tried calling all listed phone numbers for patient.   Ernst Spell., LPN

## 2017-12-29 ENCOUNTER — Emergency Department (HOSPITAL_COMMUNITY)
Admission: EM | Admit: 2017-12-29 | Discharge: 2017-12-29 | Disposition: A | Discharge status: Home / Self Care | Payer: Medicaid Other | Attending: Emergency Medicine | Admitting: Emergency Medicine

## 2017-12-29 ENCOUNTER — Encounter (HOSPITAL_COMMUNITY): Payer: Self-pay

## 2017-12-29 ENCOUNTER — Emergency Department (HOSPITAL_COMMUNITY): Payer: Medicaid Other

## 2017-12-29 ENCOUNTER — Emergency Department (HOSPITAL_BASED_OUTPATIENT_CLINIC_OR_DEPARTMENT_OTHER): Payer: Medicaid Other

## 2017-12-29 DIAGNOSIS — Z79899 Other long term (current) drug therapy: Secondary | ICD-10-CM | POA: Insufficient documentation

## 2017-12-29 DIAGNOSIS — E871 Hypo-osmolality and hyponatremia: Secondary | ICD-10-CM | POA: Diagnosis not present

## 2017-12-29 DIAGNOSIS — Z89511 Acquired absence of right leg below knee: Secondary | ICD-10-CM | POA: Diagnosis not present

## 2017-12-29 DIAGNOSIS — Z89421 Acquired absence of other right toe(s): Secondary | ICD-10-CM | POA: Insufficient documentation

## 2017-12-29 DIAGNOSIS — Z7982 Long term (current) use of aspirin: Secondary | ICD-10-CM | POA: Diagnosis not present

## 2017-12-29 DIAGNOSIS — I10 Essential (primary) hypertension: Secondary | ICD-10-CM | POA: Diagnosis not present

## 2017-12-29 DIAGNOSIS — R140 Abdominal distension (gaseous): Secondary | ICD-10-CM | POA: Diagnosis not present

## 2017-12-29 DIAGNOSIS — R52 Pain, unspecified: Secondary | ICD-10-CM | POA: Diagnosis not present

## 2017-12-29 DIAGNOSIS — F17210 Nicotine dependence, cigarettes, uncomplicated: Secondary | ICD-10-CM | POA: Diagnosis not present

## 2017-12-29 DIAGNOSIS — J810 Acute pulmonary edema: Secondary | ICD-10-CM | POA: Diagnosis not present

## 2017-12-29 DIAGNOSIS — R609 Edema, unspecified: Secondary | ICD-10-CM | POA: Diagnosis not present

## 2017-12-29 DIAGNOSIS — J90 Pleural effusion, not elsewhere classified: Secondary | ICD-10-CM

## 2017-12-29 DIAGNOSIS — R0602 Shortness of breath: Secondary | ICD-10-CM | POA: Insufficient documentation

## 2017-12-29 DIAGNOSIS — E119 Type 2 diabetes mellitus without complications: Secondary | ICD-10-CM | POA: Diagnosis not present

## 2017-12-29 DIAGNOSIS — Z794 Long term (current) use of insulin: Secondary | ICD-10-CM | POA: Insufficient documentation

## 2017-12-29 DIAGNOSIS — R220 Localized swelling, mass and lump, head: Secondary | ICD-10-CM | POA: Diagnosis present

## 2017-12-29 DIAGNOSIS — Z89411 Acquired absence of right great toe: Secondary | ICD-10-CM | POA: Diagnosis not present

## 2017-12-29 DIAGNOSIS — R600 Localized edema: Secondary | ICD-10-CM

## 2017-12-29 LAB — COMPREHENSIVE METABOLIC PANEL
ALBUMIN: 1.8 g/dL — AB (ref 3.5–5.0)
ALT: 9 U/L (ref 0–44)
ANION GAP: 5 (ref 5–15)
AST: 17 U/L (ref 15–41)
Alkaline Phosphatase: 114 U/L (ref 38–126)
BILIRUBIN TOTAL: 0.3 mg/dL (ref 0.3–1.2)
CHLORIDE: 97 mmol/L — AB (ref 98–111)
CO2: 29 mmol/L (ref 22–32)
Calcium: 7.9 mg/dL — ABNORMAL LOW (ref 8.9–10.3)
Creatinine, Ser: 0.6 mg/dL — ABNORMAL LOW (ref 0.61–1.24)
GFR calc Af Amer: >60 mL/min (ref >60)
Glucose, Bld: 131 mg/dL — ABNORMAL HIGH (ref 70–99)
POTASSIUM: 4.1 mmol/L (ref 3.5–5.1)
SODIUM: 131 mmol/L — AB (ref 135–145)
TOTAL PROTEIN: 5.5 g/dL — AB (ref 6.5–8.1)

## 2017-12-29 LAB — CBC WITH DIFFERENTIAL/PLATELET
ABS IMMATURE GRANULOCYTES: 0.04 10*3/uL (ref 0.00–0.07)
BASOS ABS: 0.1 10*3/uL (ref 0.0–0.1)
BASOS PCT: 2 %
Eosinophils Absolute: 0.2 10*3/uL (ref 0.0–0.5)
Eosinophils Relative: 3 %
HCT: 28.1 % — ABNORMAL LOW (ref 39.0–52.0)
HEMOGLOBIN: 8.6 g/dL — AB (ref 13.0–17.0)
Immature Granulocytes: 1 %
LYMPHS PCT: 12 %
Lymphs Abs: 0.9 10*3/uL (ref 0.7–4.0)
MCH: 29.8 pg (ref 26.0–34.0)
MCHC: 30.6 g/dL (ref 30.0–36.0)
MCV: 97.2 fL (ref 80.0–100.0)
MONO ABS: 0.7 10*3/uL (ref 0.1–1.0)
Monocytes Relative: 9 %
NEUTROS ABS: 6 10*3/uL (ref 1.7–7.7)
Neutrophils Relative %: 73 %
PLATELETS: 406 10*3/uL — AB (ref 150–400)
RBC: 2.89 MIL/uL — AB (ref 4.22–5.81)
RDW: 16 % — ABNORMAL HIGH (ref 11.5–15.5)
WBC: 8 10*3/uL (ref 4.0–10.5)
nRBC: 0 % (ref 0.0–0.2)

## 2017-12-29 LAB — PROTIME-INR
INR: 1.12
PROTHROMBIN TIME: 14.3 s (ref 11.4–15.2)

## 2017-12-29 LAB — BRAIN NATRIURETIC PEPTIDE: B NATRIURETIC PEPTIDE 5: 1694 pg/mL — AB (ref 0.0–100.0)

## 2017-12-29 MED ORDER — FUROSEMIDE 10 MG/ML IJ SOLN
40.0000 mg | Freq: Once | INTRAMUSCULAR | Status: AC
  Administered 2017-12-29: 40 mg via INTRAVENOUS
  Filled 2017-12-29: qty 4

## 2017-12-29 NOTE — ED Notes (Signed)
Pt dressed, monitor removed, IV removed, family at bedside.  States he wants to go home and he needs to beat the rain.  Alyssa, PA notified.

## 2017-12-29 NOTE — ED Notes (Signed)
Patient transported to X-ray 

## 2017-12-29 NOTE — ED Notes (Signed)
Pt stable, ambulatory, states understanding of discharge instructions 

## 2017-12-29 NOTE — ED Provider Notes (Signed)
[facility] EMERGENCY DEPARTMENT Provider Note   CSN: [id number] Arrival date & time: 12/29/17  1010     History   Chief Complaint Chief Complaint  Patient presents with  . Leg Pain    HPI Jay Barber is a 37 y.o. male.  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  He reports that the swelling has become so profound that he cannot perform his therapies for his surgery.  Patient reports that the swelling has been insidious onset over the past 3 weeks.  He reports that his lower extremities feel "tense".  He denies any history of CHF, liver disease, or renal disease.  He denies any erythema over his extremities or in the scrotum.  Patient does report that he has been short of breath for the past 3 months, and cannot lay flat at night.  He denies any PND.  Patient denies any fever or chills.  Patient denies any productive cough.  Patient denies ever having an echocardiogram, however his primary care provider is ordering one upcoming.  Patient denies any history of cirrhosis, but does report several year history of drinking 6-8 beers per day.  Past Medical History:  Diagnosis Date  . Anemia   . Depression   . Diabetes mellitus without complication (HCC)    takes Lantus and Novolog Type I  . Drainage from wound    right big toe  . Family history of adverse reaction to anesthesia    mother had PONV- 09/27/17 - "years ago"  . Full dentures   . GERD (gastroesophageal reflux disease)   . History of bronchitis    only had once in his life-3+yrs ago  . Hypertension   . Neuropathy associated with endocrine disorder (HCC)   . Nocturia   . Panic attacks    hx of but no meds required  . Peripheral vascular disease (HCC)   . Wears contact lenses      Patient Active Problem List   Diagnosis Date Noted  . Fluid overload 12/22/2017  . Edema of both lower extremities 12/22/2017  . Hypoalbuminemia due to protein-calorie malnutrition (HCC)   . Hyponatremia   . Essential hypertension   . Poorly controlled type 2 diabetes mellitus with peripheral neuropathy (HCC)   . Postoperative pain   . Right below-knee amputee (HCC) 12/15/2017  . S/P below knee amputation, right (HCC)   . Unilateral complete BKA, right, initial encounter (HCC)   . Post-operative pain   . Benign essential HTN   . Tobacco abuse   . Type 2 diabetes mellitus with peripheral neuropathy (HCC)   . Acute blood loss anemia   . Leukocytosis   . PAD (peripheral artery disease) (HCC) 08/09/2017  . Great toe amputation status, right 08/23/2016  . Atherosclerosis of native arteries of the extremities with ulceration (HCC) 03/14/2013  . LEUKOCYTOSIS 02/02/2008  . ALCOHOL USE 02/02/2008  . HEPATIC DISEASE 02/02/2008  . SMOKER 08/23/2007  . COUGH 08/23/2007  . Type 1 diabetes mellitus (HCC) 02/01/2007  . INSOMNIA-SLEEP DISORDER-UNSPEC 02/01/2007  . URI 02/01/2007  . ANXIETY 09/29/2006  . HYPERTENSION 09/29/2006  . GERD 09/29/2006  . PULPITIS 10/15/2003  . 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 ABDOMINAL AORTAGRAM N/A 11/06/2012   Procedure: ABDOMINAL AORTAGRAM;  Surgeon: Chuck Hint, MD;  Location: [facility] CATH LAB;  Service: Cardiovascular;  Laterality: N/A;  . ABDOMINAL AORTOGRAM N/A 06/03/2017   Procedure: ABDOMINAL AORTOGRAM;  Surgeon: Chuck Hint, MD;  Location: [facility] INVASIVE CV LAB;  Service: Cardiovascular;  Laterality: N/A;  . ABDOMINAL AORTOGRAM W/LOWER EXTREMITY N/A 12/02/2017   Procedure: ABDOMINAL AORTOGRAM W/LOWER EXTREMITY;  Surgeon: Chuck Hint, MD;  Location: [facility] INVASIVE CV LAB;  Service: Cardiovascular;  Laterality: N/A;  . AMPUTATION Right 05/11/2013   Procedure: AMPUTATION  DIGIT;  Surgeon: Nadara Mustard, MD;  Location: [facility] OR;  Service: Orthopedics;  Laterality: Right;  Right Great Toe Amputation at MTP  . AMPUTATION Right 09/28/2017   Procedure: AMPUTATION RAY 5TH TOE POSSIBLE 4TH TOE;  Surgeon: Larina Earthly, MD;  Location: [facility] OR;  Service: Vascular;  Laterality: Right;  . AMPUTATION Right 12/13/2017   Procedure: AMPUTATION BELOW KNEE;  Surgeon: Chuck Hint, MD;  Location: [facility] OR;  Service: Vascular;  Laterality: Right;  . APPLICATION OF WOUND VAC Right 09/28/2017   Procedure: APPLICATION OF WOUND VAC - RIGHT FOOT;  Surgeon: Larina Earthly, MD;  Location: [facility] OR;  Service: Vascular;  Laterality: Right;  . BELOW KNEE LEG AMPUTATION Right 12/13/2017  . ESOPHAGOGASTRODUODENOSCOPY    . FEMORAL ARTERY - POPLITEAL ARTERY BYPASS GRAFT  08/09/2017  . FEMORAL-POPLITEAL BYPASS GRAFT Right 08/09/2017   Procedure: BYPASS GRAFT FEMORAL-BELOW KNEE POPLITEAL ARTERY USING X 80CM GORE PROPATEN VASCULAR GRAFT;  Surgeon: Chuck Hint, MD;  Location: [facility] OR;  Service: Vascular;  Laterality: Right;  . INTRAOPERATIVE ARTERIOGRAM Right 08/09/2017   Procedure: INTRA OPERATIVE ARTERIOGRAM RIGHT LOWER LEG;  Surgeon: Chuck Hint, MD;  Location: [facility] OR;  Service: Vascular;  Laterality: Right;  . LOWER EXTREMITY ANGIOGRAPHY Bilateral 06/03/2017   Procedure: Lower Extremity Angiography;  Surgeon: Chuck Hint, MD;  Location: [facility] INVASIVE CV LAB;  Service: Cardiovascular;  Laterality: Bilateral;  . MOUTH SURGERY    . MULTIPLE TOOTH EXTRACTIONS          Home Medications    Prior to Admission medications   Medication Sig Start Date End Date Taking? Authorizing Provider  ALPRAZolam Prudy Feeler) 1 MG tablet Take 1 mg by mouth 4 (four) times daily as needed for anxiety.  02/21/14   [provider]  aspirin 81 MG tablet Take 81 mg by mouth every evening.     [provider]  Dakins (HYSEPT) 0.25 % SOLN Apply 1 application topically 2 (two) times  daily. 11/29/17   [provider]  ibuprofen (ADVIL,MOTRIN) 200 MG tablet Take 400 mg by mouth every 6 (six) hours as needed.    [provider]  imipramine (TOFRANIL) 25 MG tablet Take 50 mg by mouth at bedtime.     [provider]  insulin aspart (NOVOLOG) 100 UNIT/ML injection Inject 2-4 Units into the skin 3 (three) times daily with meals. Per sliding scale    [provider]  insulin glargine (LANTUS) 100 UNIT/ML injection Inject 12 Units into the skin at bedtime.     [provider]  lisinopril (PRINIVIL,ZESTRIL) 20 MG tablet Take 20 mg by mouth every evening.  08/30/17   [provider]  Melatonin 3 MG CAPS Take 6 mg by mouth at bedtime as needed (sleep).    [provider]  Multiple Vitamins-Minerals (ALIVE MENS ENERGY PO) Take 1 tablet by mouth daily.    [provider]  NICOTINE STEP 1 21 MG/24HR patch Place 21 mg onto  the skin daily.  08/30/17   [provider]  omeprazole (PRILOSEC OTC) 20 MG tablet Take 20 mg by mouth daily as needed (for acid reflux).    [provider]  simvastatin (ZOCOR) 20 MG tablet Take 1 tablet (20 mg total) by mouth daily at 6 PM. 08/10/17   Emilie Rutter, PA-C  sulfamethoxazole-trimethoprim (BACTRIM DS,SEPTRA DS) 800-160 MG tablet Take 1 tablet by mouth 2 (two) times daily.    [provider]  tiotropium (SPIRIVA) 18 MCG inhalation capsule Place 18 mcg into inhaler and inhale daily.    [provider]    Family History Family History  Problem Relation Age of Onset  . Heart disease Father        before age 31  . Hypertension Mother   . Depression Mother   . GER disease Mother     Social History Social History   Tobacco Use  . Smoking status: Current Every Day Smoker    Packs/day: 1.00    Years: 14.00    Pack years: 14.00    Types: Cigarettes  . Smokeless tobacco: Never Used  . Tobacco comment: patient report that he has decreased amount some   Substance Use Topics  . Alcohol use: Yes    Comment: occaionally  . Drug use: No     Allergies   Bupropion   Review of Systems Review of Systems  Constitutional: Negative for chills and fever.  HENT: Negative for congestion and sore throat.   Eyes: Negative for visual disturbance.  Respiratory: Negative for cough, chest tightness and shortness of breath.   Cardiovascular: Positive for leg swelling. Negative for chest pain and palpitations.  Gastrointestinal: Positive for abdominal distention. Negative for abdominal pain, nausea and vomiting.  Genitourinary: Negative for dysuria and flank pain.  Musculoskeletal: Negative for back pain and myalgias.  Skin: Negative for rash.  Neurological: Negative for dizziness, syncope, light-headedness and headaches.     Physical Exam Updated Vital Signs BP 100/68   Pulse 94   Temp 98.9 F (37.2 C) (Oral)   Resp 17   Ht 5\' 11"  (1.803 m)   Wt 72.1 kg   SpO2 94%   BMI 22.17 kg/m   Physical Exam  Constitutional: He appears well-developed and well-nourished. No distress.  HENT:  Head: Normocephalic and atraumatic.  Mouth/Throat: Oropharynx is clear and moist.  Mucous membranes are pale.  Eyes: Pupils are equal, round, and reactive to light. Conjunctivae and EOM are normal.  Neck: Normal range of motion. Neck supple.  Cardiovascular: Normal rate, regular rhythm, S1 normal and S2 normal.  No murmur heard. Pulmonary/Chest: Effort normal and breath sounds normal. He has no wheezes. He has no rales.  Abdominal: Soft. He exhibits no distension. There is no tenderness. There is no guarding.  Musculoskeletal: Normal range of motion. He exhibits edema. He exhibits no deformity.  Right BKA with well-healing incision with staples in place. Patient has 2+ pitting edema to the level of the tibial tuberosity on left, and surrounding residual stump on right. Rubor to the left lower extremity, but no circumscribed erythema.  Neurological: He is  alert.  Cranial nerves grossly intact. Patient moves extremities symmetrically and with good coordination.  Skin: Skin is warm and dry. No rash noted. No erythema.  Psychiatric: He has a normal mood and affect. His behavior is normal. Judgment and thought content normal.  Nursing note and vitals reviewed.    ED Treatments / Results  Labs (all labs ordered are listed, but  only abnormal results are displayed) Labs Reviewed  COMPREHENSIVE METABOLIC PANEL - Abnormal; Notable for the following components:      Result Value   Sodium 131 (*)    Chloride 97 (*)    Glucose, Bld 131 (*)    BUN <5 (*)    Creatinine, Ser 0.60 (*)    Calcium 7.9 (*)    Total Protein 5.5 (*)    Albumin 1.8 (*)    All other components within normal limits  CBC WITH DIFFERENTIAL/PLATELET - Abnormal; Notable for the following components:   RBC 2.89 (*)    Hemoglobin 8.6 (*)    HCT 28.1 (*)    RDW 16.0 (*)    Platelets 406 (*)    All other components within normal limits  BRAIN NATRIURETIC PEPTIDE  PROTIME-INR    EKG EKG Interpretation  Date/Time:  Thursday December 29 2017 14:57:32 EDT Ventricular Rate:  95 PR Interval:    QRS Duration: 68 QT Interval:  380 QTC Calculation: 478 R Axis:   61 Text Interpretation:  Normal sinus rhythm Non-specific ST-t changes Confirmed by Margarita Grizzle [phone] on 12/29/2017 5:28:28 PM   Radiology Dg Chest 2 View  Result Date: 12/29/2017 CLINICAL DATA:  Shortness of breath EXAM: CHEST - 2 VIEW COMPARISON:  December 10, 2015 FINDINGS: There are fairly small pleural effusions bilaterally with bibasilar atelectasis. The lungs elsewhere are clear. Heart size and pulmonary vascularity are normal. No adenopathy. No bone lesions. IMPRESSION: Pleural effusions bilaterally with bibasilar atelectasis. Lungs elsewhere clear. Heart size. No evident adenopathy. Electronically Signed   By: Bretta Bang III M.D.   On: 12/29/2017 13:21   Vas [place] Lower Extremity Venous (dvt) (only  [facility])  Result Date: 12/29/2017  Lower Venous Study Indications: Pain, Edema, and Recent Right BKA.  Limitations: Poor ultrasound/tissue interface and patient pain. Performing Technologist: Farrel Demark RDMS, RVT  Examination Guidelines: A complete evaluation includes B-mode imaging, spectral Doppler, color Doppler, and power Doppler as needed of all accessible portions of each vessel. Bilateral testing is considered an integral part of a complete examination. Limited examinations for reoccurring indications may be performed as noted.  Right Venous Findings: +---------+---------------+---------+-----------+----------+-------+          CompressibilityPhasicitySpontaneityPropertiesSummary +---------+---------------+---------+-----------+----------+-------+ CFV                     Yes      Yes                          +---------+---------------+---------+-----------+----------+-------+ SFJ                              Yes                          +---------+---------------+---------+-----------+----------+-------+ FV Prox                          Yes                          +---------+---------------+---------+-----------+----------+-------+ FV Mid                           Yes                          +---------+---------------+---------+-----------+----------+-------+  FV Distal                        Yes                          +---------+---------------+---------+-----------+----------+-------+ POP                     Yes      Yes                          +---------+---------------+---------+-----------+----------+-------+  Left Venous Findings: +---------+---------------+---------+-----------+----------+-------+          CompressibilityPhasicitySpontaneityPropertiesSummary +---------+---------------+---------+-----------+----------+-------+ CFV                     Yes      Yes                           +---------+---------------+---------+-----------+----------+-------+ SFJ                              Yes                          +---------+---------------+---------+-----------+----------+-------+ FV Prox                          Yes                          +---------+---------------+---------+-----------+----------+-------+ FV Mid                           Yes                          +---------+---------------+---------+-----------+----------+-------+ FV Distal                        Yes                          +---------+---------------+---------+-----------+----------+-------+ POP      Full           Yes      Yes                          +---------+---------------+---------+-----------+----------+-------+ PTV      Full                    Yes                          +---------+---------------+---------+-----------+----------+-------+ PERO     Full                    Yes                          +---------+---------------+---------+-----------+----------+-------+    Summary: Right: Unable to evaluate veins by compression, however veins are patent by Color Doppler and waveforms. Pulsatile venous flow is suggestive of increased right side heart pressure. Ultrasound characteristics of enlarged lymph nodes are noted in the groin. Left: Unable to evaluate veins by compression, however  veins are patent by Color Doppler and waveforms. Pulsatile venous flow is suggestive of increased right side heart pressure. Ultrasound characteristics of enlarged lymph nodes noted in the groin.  *See table(s) above for measurements and observations. Electronically signed by Sherald Hess MD on 12/29/2017 at 6:14:26 PM.    Final     Procedures Procedures (including critical care time)  Medications Ordered in ED Medications - No data to display   Initial Impression / Assessment and Plan / ED Course  I have reviewed the triage vital signs and the nursing  notes.  Pertinent labs & imaging results that were available during my care of the patient were reviewed by me and considered in my medical decision making (see chart for details).  Clinical Course as of Dec 31 18  Thu Dec 29, 2017  1240 Hemoglobin(!): 8.6 [AM]  1241 Hemoglobin stable.   [AM]  1424 Suggestive of fluid overload.  B Natriuretic Peptide(!): 1,694.0 [AM]  1425 Patient is chronically hyponatremic.   Sodium(!): 131 [AM]  1515 Engaged in shared decision-making with the patient at bedside.  Patient is adamant that he would not like to stay in the hospital for diuresis.  He reports that he would like to do this outpatient.  Discussed with patient that this is AGAINST MEDICAL ADVICE given the extent of fluid in his lungs.  I discussed with patient that if this is to be performed outpatient, would recommend appointment tomorrow with primary care provider for weight check, blood pressure check.    [AM]  1609 400 mL UOP after Lasix.   [AM]    Clinical Course User Index [AM] Elisha Ponder, PA-C    Patient is nontoxic-appearing hemodynamically stable.  Differential diagnosis includes new onset heart failure, liver disease, DVT.  Work-up significant for pulmonary edema bilateral pleural effusions.  Patient has stable anemia, see below for trend.  Values only trend back in October 2019, and previous notes attributed to blood loss.  Unclear if patient has ongoing anemia prior to this.  Sodium is 131, consistent from prior evaluations.  Patient also has low total protein, low albumin consistent with possible ongoing liver disease.  BNP elevated to 1694.  DVT study without Doppler evidence of venous occlusion in bilateral lower extremity's.  Compressibility unable to be assessed due to patient tolerance.  Hemoglobin  Date Value Ref Range Status  12/29/2017 8.6 (L) 13.0 - 17.0 g/dL Final  16/11/9602 8.0 (L) 13.0 - 17.0 g/dL Final  54/10/8117 8.4 (L) 13.0 - 17.0 g/dL Final  14/78/2956  8.3 (L) 13.0 - 17.0 g/dL Final   Patient has never had cardiac evaluation for possible heart failure but given the progressive nature of his swelling over the last couple months, patient likely with new onset heart failure.  Patient was resistant to hospitalization despite reviewing the risks with patient of leaving.  Patient is also in the midst of an outpatient work-up for his symptoms with his primary care provider.  Patient reports that he was prescribed Lasix.  It was recommended to the patient that he presented his primary care provider's office tomorrow for blood pressure recheck and weight check in addition to symptom management, and if blood pressure stable, have primary office assist in the management of his Lasix.  Patient was given 40 mg of Lasix in emergency department today.  Ambulatory referral placed to cardiology.  Patient was in for understanding of risks, and agreed to immediately return should he develop any acute shortness of breath, chest pain, dizziness,  lightheadedness, syncope or presyncope.  This is a shared visit with Dr. Margarita Grizzle. Patient was independently evaluated by this attending physician. Attending physician consulted in evaluation and management.  Final Clinical Impressions(s) / ED Diagnoses   Final diagnoses:  Lower extremity edema  Acute pulmonary edema (HCC)  Pleural effusion  Hyponatremia    ED Discharge Orders         Ordered    Ambulatory referral to Cardiology    Comments:  New onset heart failure workup. Pt did not wish to stay for inpatient evaluation.   12/29/17 1636           Aviva Kluver B, PA-C 12/30/17 Moses Manners    Margarita Grizzle, MD 12/30/17 [phone]

## 2017-12-29 NOTE — ED Triage Notes (Signed)
Pt presents via [facility] for evaluation of left leg pain and swelling x 3 months with worsening over past 3 weeks. Pt has had recent R BKA. In rehab now.

## 2017-12-29 NOTE — Discharge Instructions (Signed)
Instructions for PCP visit tomorrow:   Please visit for reevaluation of your breathing, weight check, and blood pressure.    Please see the information and instructions below regarding your visit.  Your diagnoses today include:  1. Lower extremity edema   2. Acute pulmonary edema (HCC)   3. Pleural effusion   4. Hyponatremia    Your presentation today is concerning for an alteration of the pumping function of your heart.  This can be due to many causes, but needs to be worked up by a cardiologist.  I placed a referral to our cardiology group.  Tests performed today include: See side panel of your discharge paperwork for testing performed today. Vital signs are listed at the bottom of these instructions.   Your chest x-ray shows that you have fluid backing up into the lungs.  Medications prescribed:    Take any prescribed medications only as prescribed, and any over the counter medications only as directed on the packaging.  Please begin taking Lasix only after speaking with your primary care provider tomorrow and seeing them in recheck.  Home care instructions:  Please follow any educational materials contained in this packet.   Please see the attached instructions on reducing salt from your diet.  Follow-up instructions: Please follow-up with your primary care provider tomorrow for further evaluation of your symptoms if they are not completely improved.   Please follow up with cardiology.  I placed a referral.  Return instructions:  Please return to the Emergency Department if you experience worsening symptoms.  Please return to the emergency department immediately if you develop any shortness of breath, chest pain, pain in lower extremities, dizziness, lightheadedness, or passing out spells, or feeling you are going to pass out.  Please return if you have any other emergent concerns.  Additional Information:   Your vital signs today were: BP 114/70    Pulse 95    Temp  98.1 F (36.7 C) (Oral)    Resp 16    Ht 5\' 11"  (1.803 m)    Wt 72.1 kg    SpO2 100%    BMI 22.17 kg/m  If your blood pressure (BP) was elevated on multiple readings during this visit above 130 for the top number or above 80 for the bottom number, please have this repeated by your primary care provider within one month. --------------  Thank you for allowing [place] to participate in your care today.

## 2017-12-29 NOTE — Progress Notes (Signed)
BLE venous duplex prelim: Technically limited due to edema and patient pain. Unable to evaluate veins by compression, however veins are patent by Color Doppler and waveforms. Pulsatile venous flow is suggestive of increased right side heart pressure. Bilateral enlarged inguinal lymph nodes.   Farrel Demark, RDMS, RVT

## 2017-12-30 ENCOUNTER — Telehealth: Payer: Self-pay | Admitting: Vascular Surgery

## 2017-12-30 ENCOUNTER — Other Ambulatory Visit: Payer: Self-pay

## 2017-12-30 DIAGNOSIS — I7025 Atherosclerosis of native arteries of other extremities with ulceration: Secondary | ICD-10-CM

## 2017-12-30 DIAGNOSIS — E8770 Fluid overload, unspecified: Secondary | ICD-10-CM

## 2017-12-30 DIAGNOSIS — R600 Localized edema: Secondary | ICD-10-CM

## 2017-12-30 NOTE — Telephone Encounter (Signed)
FYIDot Lanes from [facility] called.  Pt has missed OT visit.  Recently went to ED for fluid build-up around his heart and lungs.  Supposed to be starting Lasix therapy.    Ernst Spell., LPN

## 2018-01-11 ENCOUNTER — Other Ambulatory Visit: Payer: Self-pay

## 2018-01-11 ENCOUNTER — Encounter: Payer: Medicaid Other | Admitting: Vascular Surgery

## 2018-01-11 ENCOUNTER — Encounter: Payer: Self-pay | Admitting: Vascular Surgery

## 2018-01-11 ENCOUNTER — Ambulatory Visit: Payer: Medicaid Other | Admitting: Vascular Surgery

## 2018-01-11 VITALS — BP 131/84 | HR 101 | Temp 97.7°F | Resp 16 | Ht 71.0 in | Wt 167.0 lb

## 2018-01-11 DIAGNOSIS — Z48812 Encounter for surgical aftercare following surgery on the circulatory system: Secondary | ICD-10-CM

## 2018-01-11 NOTE — Progress Notes (Signed)
Patient name: Jay Barber MRN: [id number] DOB: 07-31-1980 Sex: male  REASON FOR VISIT:   Follow-up after right below-knee amputation.  HPI:   Jay Barber is a pleasant 37 y.o. 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  He also has some wounds on the left leg on the anterior surface of the ankle the lateral aspect of the distal left leg and also on the heel.  He is followed in the wound care center and these wounds have all 3 been improving.  He did have an issue with significant bilateral lower extremity swelling and is now on diuretics.  He has been elevating his legs some.  Current Outpatient Medications  Medication Sig Dispense Refill  . ALPRAZolam (XANAX) 1 MG tablet Take 1 mg by mouth 4 (four) times daily as needed for anxiety.     Marland Kitchen. aspirin 81 MG tablet Take 81 mg by mouth every evening.     . Dakins (HYSEPT) 0.25 % SOLN Apply 1 application topically 2 (two) times daily.  1  . ibuprofen (ADVIL,MOTRIN) 200 MG tablet Take 400 mg by mouth every 6 (six) hours as needed.    Marland Kitchen. imipramine (TOFRANIL) 25 MG tablet Take 50 mg by mouth at bedtime.     . insulin aspart (NOVOLOG) 100 UNIT/ML injection Inject 2-4 Units into the skin 3 (three) times daily with meals. Per sliding scale    . insulin glargine (LANTUS) 100 UNIT/ML injection Inject 12 Units into the skin at bedtime.     Marland Kitchen. lisinopril (PRINIVIL,ZESTRIL) 20 MG tablet Take 20 mg by mouth every evening.   5  . Melatonin 3 MG CAPS Take 6 mg by mouth at bedtime as needed (sleep).    . Multiple Vitamins-Minerals (ALIVE MENS ENERGY PO) Take 1 tablet by mouth daily.    Marland Kitchen. NICOTINE STEP 1 21 MG/24HR patch Place 21 mg onto the skin daily.   0  . omeprazole (PRILOSEC OTC) 20 MG tablet Take 20 mg by mouth daily as needed (for acid reflux).    . simvastatin (ZOCOR) 20 MG tablet Take 1 tablet (20 mg total) by mouth daily at 6 PM. 30 tablet 5  .  sulfamethoxazole-trimethoprim (BACTRIM DS,SEPTRA DS) 800-160 MG tablet Take 1 tablet by mouth 2 (two) times daily.    Marland Kitchen. tiotropium (SPIRIVA) 18 MCG inhalation capsule Place 18 mcg into inhaler and inhale daily.     No current facility-administered medications for this visit.     REVIEW OF SYSTEMS:  [X]  denotes positive finding, [ ]  denotes negative finding Vascular    Leg swelling    Cardiac    Chest pain or chest pressure:    Shortness of breath upon exertion:    Short of breath when lying flat:    Irregular heart rhythm:    Constitutional    Fever or chills:     PHYSICAL EXAM:   Vitals:   01/11/18 1535  BP: 131/84  Pulse: (!) 101  Resp: 16  Temp: 97.7 F (36.5 C)  TempSrc: Oral  SpO2: 100%  Weight: 167 lb (75.8 kg)  Height: 5\' 11"  (1.803 m)   GENERAL: The patient is a well-nourished male, in no acute distress. The vital signs are documented above. CARDIOVASCULAR: There is a regular rate and rhythm. PULMONARY: There is good air exchange bilaterally without wheezing or rales. He is right below the knee amputation site is healing adequately and we will  remove his staples in the office today.  He does have moderate swelling for this reason we placed Steri-Strips.  DATA:   ARTERIOGRAM: I did review his arteriogram that was done on 12/02/2017.  On the left side, the common iliac and hypogastric arteries were patent.  There was mild disease noted in the left external iliac artery but no significant pressure gradient at rest was noted.  The common femoral, superficial femoral, deep femoral arteries were all patent.  There was moderate to severe disease in the popliteal artery.  The posterior tibial artery in the left was occluded.  The proximal anterior tibial artery had a short segment occlusion but was patent beyond that.  The peroneal artery on the left was patent.  Thus there was two-vessel runoff in the left via the anterior tibial and peroneal arteries.  MEDICAL ISSUES:    PERIPHERAL VASCULAR DISEASE: [name] right below the knee amputation site is healing adequately.  We removed his staples in the office today.  He does have 3 wounds on the left leg and will need to follow this closely.  I have ordered a follow-up visit in 2 weeks with ABIs at that time.  He knows to call sooner if he has problems.   Waverly Ferrari [facility] [phone]

## 2018-01-16 ENCOUNTER — Telehealth: Payer: Self-pay

## 2018-01-16 ENCOUNTER — Ambulatory Visit: Payer: Medicaid Other | Admitting: Cardiology

## 2018-01-16 NOTE — Telephone Encounter (Signed)
Spoke with patient's mother Darl Pikes(Susan) who is concerned about prosthesis for patient. He is unable to do PT until swelling resolves or improves and she is concerned he will not get prosthesis. I called her back and left message with [employer]'s number.

## 2018-01-18 ENCOUNTER — Ambulatory Visit: Payer: Medicaid Other | Admitting: Vascular Surgery

## 2018-01-19 NOTE — Progress Notes (Signed)
Cardiology Office Note:    Date:  01/20/2018   ID:  Jay BlazerJason B Hable, DOB 05/31/1980, MRN [id number]  PCP:  Dulce SellarHudnell, Stephanie, NP  Cardiologist:  Norman HerrlichBrian Skylene Deremer, MD   Referring MD: Elisha PonderMurray, Alyssa B, PA-C  ASSESSMENT:    1. Bilateral lower extremity edema   2. Elevated brain natriuretic peptide (BNP) level   3. Benign essential HTN   4. PAD (peripheral artery disease) (HCC)   5. Anemia due to blood loss    PLAN:    In order of problems listed above:  1. His lower extremity edema appears to be multifactorial with hypertension diabetes and complaints of heart failure IV fluid loading and anemia improved with and continue loop diuretic check labs for renal function potassium and proBNP level echocardiogram regarding cardiomyopathy and ejection fraction to guide treatment 2. Related to volume overload and heart failure recheck today he is markedly improved 3. Stable blood pressure target and continue his loop diuretic 4. Improved after right lower extremity amputation 5. Recheck CBC today 6. Hyperlipidemia stable continue statin 7. Diabetes managed by his PCP  Next appointment 3 months   Medication Adjustments/Labs and Tests Ordered: Current medicines are reviewed at length with the patient today.  Concerns regarding medicines are outlined above.  No orders of the defined types were placed in this encounter.  No orders of the defined types were placed in this encounter.    Chief Complaint  Patient presents with  . Leg Swelling    History of Present Illness:    Jay BlazerJason B Barber is a 37 y.o. male with Type 1 diabetes, hypertension, PAD, anemia, alcohol abuse neuropathy and R femoral popliteal bypass June 2019 who is being seen today for the evaluation of edema and elevated BNP 1694 at the request of Elisha PonderMurray, Alyssa B, PA-Cand DR Waverly Ferrarihristopher Dickson. He has markedly improved since hospital discharge his weight is down 30 pounds but he is still 10 to 17 pounds above his baseline  his peripheral edema is markedly diminished No longer has orthopnea or supine cough that he had previously.  He attributes much of this to his diuretic and his sodium restricts.  His overall strength and endurance are better no chest pain palpitation or syncope his edema is multifactorial with IV fluid loading of surgery blood loss anemia also likely has heart failure in the setting of diabetes and hypertension and continue his loop diuretic sodium restriction will undergo echocardiogram to define ejection fraction if diminished he will need guideline directed therapy.  Today we will recheck a CBC as last was 8.6 hemoglobin recheck renal function as well as proBNP level.  He has no known history of congenital rheumatic heart disease he is at risk for cardiomyopathy with alcohol abuse  Past Medical History:  Diagnosis Date  . Anemia   . Depression   . Diabetes mellitus without complication (HCC)    takes Lantus and Novolog Type I  . Drainage from wound    right big toe  . Family history of adverse reaction to anesthesia    mother had PONV- 09/27/17 - "years ago"  . Full dentures   . GERD (gastroesophageal reflux disease)   . History of bronchitis    only had once in his life-3+yrs ago  . Hypertension   . Neuropathy associated with endocrine disorder (HCC)   . Nocturia   . Panic attacks    hx of but no meds required  . Peripheral vascular disease (HCC)   . Wears contact lenses  Past Surgical History:  Procedure Laterality Date  . ABDOMINAL AORTAGRAM N/A 11/06/2012   Procedure: ABDOMINAL Ronny Flurry;  Surgeon: Chuck Hint, MD;  Location: [facility] CATH LAB;  Service: Cardiovascular;  Laterality: N/A;  . ABDOMINAL AORTOGRAM N/A 06/03/2017   Procedure: ABDOMINAL AORTOGRAM;  Surgeon: Chuck Hint, MD;  Location: [facility] INVASIVE CV LAB;  Service: Cardiovascular;  Laterality: N/A;  . ABDOMINAL AORTOGRAM W/LOWER EXTREMITY N/A 12/02/2017   Procedure: ABDOMINAL AORTOGRAM W/LOWER EXTREMITY;   Surgeon: Chuck Hint, MD;  Location: [facility] INVASIVE CV LAB;  Service: Cardiovascular;  Laterality: N/A;  . AMPUTATION Right 05/11/2013   Procedure: AMPUTATION DIGIT;  Surgeon: Nadara Mustard, MD;  Location: [facility] OR;  Service: Orthopedics;  Laterality: Right;  Right Great Toe Amputation at MTP  . AMPUTATION Right 09/28/2017   Procedure: AMPUTATION RAY 5TH TOE POSSIBLE 4TH TOE;  Surgeon: Larina Earthly, MD;  Location: [facility] OR;  Service: Vascular;  Laterality: Right;  . AMPUTATION Right 12/13/2017   Procedure: AMPUTATION BELOW KNEE;  Surgeon: Chuck Hint, MD;  Location: [facility] OR;  Service: Vascular;  Laterality: Right;  . APPLICATION OF WOUND VAC Right 09/28/2017   Procedure: APPLICATION OF WOUND VAC - RIGHT FOOT;  Surgeon: Larina Earthly, MD;  Location: [facility] OR;  Service: Vascular;  Laterality: Right;  . BELOW KNEE LEG AMPUTATION Right 12/13/2017  . ESOPHAGOGASTRODUODENOSCOPY    . FEMORAL ARTERY - POPLITEAL ARTERY BYPASS GRAFT  08/09/2017  . FEMORAL-POPLITEAL BYPASS GRAFT Right 08/09/2017   Procedure: BYPASS GRAFT FEMORAL-BELOW KNEE POPLITEAL ARTERY USING X 80CM GORE PROPATEN VASCULAR GRAFT;  Surgeon: Chuck Hint, MD;  Location: [facility] OR;  Service: Vascular;  Laterality: Right;  . INTRAOPERATIVE ARTERIOGRAM Right 08/09/2017   Procedure: INTRA OPERATIVE ARTERIOGRAM RIGHT LOWER LEG;  Surgeon: Chuck Hint, MD;  Location: [facility] OR;  Service: Vascular;  Laterality: Right;  . LOWER EXTREMITY ANGIOGRAPHY Bilateral 06/03/2017   Procedure: Lower Extremity Angiography;  Surgeon: Chuck Hint, MD;  Location: [facility] INVASIVE CV LAB;  Service: Cardiovascular;  Laterality: Bilateral;  . MOUTH SURGERY    . MULTIPLE TOOTH EXTRACTIONS      Current Medications: Current Meds  Medication Sig  . ALPRAZolam (XANAX) 1 MG tablet Take 1 mg by mouth 4 (four) times daily as needed for anxiety.   Marland Kitchen aspirin 81 MG tablet Take 81 mg by mouth every evening.   . Dakins (HYSEPT) 0.25 % SOLN Apply 1  application topically 2 (two) times daily.  Marland Kitchen ibuprofen (ADVIL,MOTRIN) 200 MG tablet Take 400 mg by mouth every 6 (six) hours as needed.  Marland Kitchen imipramine (TOFRANIL) 25 MG tablet Take 50 mg by mouth at bedtime.   . insulin aspart (NOVOLOG) 100 UNIT/ML injection Inject 2-4 Units into the skin 3 (three) times daily with meals. Per sliding scale  . insulin glargine (LANTUS) 100 UNIT/ML injection Inject 12 Units into the skin at bedtime.   Marland Kitchen lisinopril (PRINIVIL,ZESTRIL) 20 MG tablet Take 20 mg by mouth every evening.   . Melatonin 3 MG CAPS Take 6 mg by mouth at bedtime as needed (sleep).  . Multiple Vitamins-Minerals (ALIVE MENS ENERGY PO) Take 1 tablet by mouth daily.  Marland Kitchen omeprazole (PRILOSEC OTC) 20 MG tablet Take 20 mg by mouth daily as needed (for acid reflux).  . simvastatin (ZOCOR) 20 MG tablet Take 1 tablet (20 mg total) by mouth daily at 6 PM.     Allergies:   Bupropion   Social History   Socioeconomic History  . Marital status: Single  Spouse name: Not on file  . Number of children: Not on file  . Years of education: Not on file  . Highest education level: Not on file  Occupational History  . Occupation: disable   Social Needs  . Financial resource strain: Not on file  . Food insecurity:    Worry: Not on file    Inability: Not on file  . Transportation needs:    Medical: Not on file    Non-medical: Not on file  Tobacco Use  . Smoking status: Current Every Day Smoker    Packs/day: 1.00    Years: 14.00    Pack years: 14.00    Types: Cigarettes  . Smokeless tobacco: Never Used  . Tobacco comment: Less than 1 pk  Substance and Sexual Activity  . Alcohol use: Yes    Comment: occaionally  . Drug use: No  . Sexual activity: Yes  Lifestyle  . Physical activity:    Days per week: Not on file    Minutes per session: Not on file  . Stress: Not on file  Relationships  . Social connections:    Talks on phone: Not on file    Gets together: Not on file    Attends religious  service: Not on file    Active member of club or organization: Not on file    Attends meetings of clubs or organizations: Not on file    Relationship status: Not on file  Other Topics Concern  . Not on file  Social History Narrative   Resides with spouse and children      Family History: [name] family history includes Depression in his mother; GER disease in his mother; Heart disease in his father; Hypertension in his mother.  ROS:   Review of Systems  Constitution: Positive for weight loss (30 lbs since surgery).  HENT: Negative.   Eyes: Negative.   Cardiovascular: Positive for leg swelling and orthopnea.  Respiratory: Positive for cough (supine).   Endocrine: Negative.   Hematologic/Lymphatic: Negative.   Skin: Negative.   Musculoskeletal: Negative.   Gastrointestinal: Negative.   Genitourinary: Negative.   Neurological: Negative.   Psychiatric/Behavioral: Negative.   Allergic/Immunologic: Negative.    Please see the history of present illness.     All other systems reviewed and are negative.  EKGs/Labs/Other Studies Reviewed:    The following studies were reviewed today:   EKG:  EKG 12/30/17 [facility] nonspecific ST  Recent Labs: 12/29/2017: ALT 9; B Natriuretic Peptide 1,694.0; BUN <5; Creatinine, Ser 0.60; Hemoglobin 8.6; Platelets 406; Potassium 4.1; Sodium 131  Recent Lipid Panel    Component Value Date/Time   CHOL 226 (H) 04/15/2009 1439   TRIG 46.0 04/15/2009 1439   HDL 136.40 04/15/2009 1439   CHOLHDL 2 04/15/2009 1439   VLDL 9.2 04/15/2009 1439   LDLDIRECT 77.5 04/15/2009 1439    Physical Exam:    VS:  BP 120/76 (BP Location: Right Arm, Patient Position: Sitting, Cuff Size: Normal)   Pulse 95   Ht 5\' 11"  (1.803 m)   Wt 147 lb 1.9 oz (66.7 kg)   SpO2 98%   BMI 20.52 kg/m     Wt Readings from Last 3 Encounters:  01/20/18 147 lb 1.9 oz (66.7 kg)  01/11/18 167 lb (75.8 kg)  12/29/17 158 lb 15.2 oz (72.1 kg)     GEN:  Well nourished, well  developed in no acute distress HEENT: Normal NECK: No JVD; No carotid bruits LYMPHATICS: No lymphadenopathy CARDIAC: soft S3  RRR, no murmurs, rubs, gallops RESPIRATORY:  Clear to auscultation without rales, wheezing or rhonchi  ABDOMEN: Soft, non-tender, non-distended MUSCULOSKELETAL:  2+ edema LLE above the knee edema; No deformity  SKIN: Warm and dry NEUROLOGIC:  Alert and oriented x 3 PSYCHIATRIC:  Normal affect     Signed, Norman Herrlich, MD  01/20/2018 1:55 PM    [facility]

## 2018-01-20 ENCOUNTER — Encounter: Payer: Self-pay | Admitting: Cardiology

## 2018-01-20 ENCOUNTER — Ambulatory Visit (INDEPENDENT_AMBULATORY_CARE_PROVIDER_SITE_OTHER): Payer: Medicaid Other | Admitting: Cardiology

## 2018-01-20 VITALS — BP 120/76 | HR 95 | Ht 71.0 in | Wt 147.1 lb

## 2018-01-20 DIAGNOSIS — R600 Localized edema: Secondary | ICD-10-CM | POA: Diagnosis not present

## 2018-01-20 DIAGNOSIS — D500 Iron deficiency anemia secondary to blood loss (chronic): Secondary | ICD-10-CM

## 2018-01-20 DIAGNOSIS — I739 Peripheral vascular disease, unspecified: Secondary | ICD-10-CM | POA: Diagnosis not present

## 2018-01-20 DIAGNOSIS — R7989 Other specified abnormal findings of blood chemistry: Secondary | ICD-10-CM | POA: Diagnosis not present

## 2018-01-20 DIAGNOSIS — I10 Essential (primary) hypertension: Secondary | ICD-10-CM | POA: Diagnosis not present

## 2018-01-20 MED ORDER — FUROSEMIDE 20 MG PO TABS: 20.0000 mg | ORAL_TABLET | Freq: Every day | ORAL | 6 refills | Status: DC

## 2018-01-20 NOTE — Patient Instructions (Signed)
Medication Instructions:  Your physician has recommended you make the following change in your medication:   RESTART furosemide (lasix) 20 mg: Take 1 tablet daily  If you need a refill on your cardiac medications before your next appointment, please call your pharmacy.   Lab work: Your physician recommends that you return for lab work today: BMP, ProBNP, CBC.   If you have labs (blood work) drawn today and your tests are completely normal, you will receive your results only by: Marland Kitchen. MyChart Message (if you have MyChart) OR . A paper copy in the mail If you have any lab test that is abnormal or we need to change your treatment, we will call you to review the results.  Testing/Procedures: Your physician has requested that you have an echocardiogram. Echocardiography is a painless test that uses sound waves to create images of your heart. It provides your doctor with information about the size and shape of your heart and how well your heart's chambers and valves are working. This procedure takes approximately one hour. There are no restrictions for this procedure.  Follow-Up: At [facility], you and your health needs are our priority.  As part of our continuing mission to provide you with exceptional heart care, we have created designated Provider Care Teams.  These Care Teams include your primary Cardiologist (physician) and Advanced Practice Providers (APPs -  Physician Assistants and Nurse Practitioners) who all work together to provide you with the care you need, when you need it. You will need a follow up appointment in 3 months.      Echocardiogram An echocardiogram, or echocardiography, uses sound waves (ultrasound) to produce an image of your heart. The echocardiogram is simple, painless, obtained within a short period of time, and offers valuable information to your health care provider. The images from an echocardiogram can provide information such as:  Evidence of coronary artery  disease (CAD).  Heart size.  Heart muscle function.  Heart valve function.  Aneurysm detection.  Evidence of a past heart attack.  Fluid buildup around the heart.  Heart muscle thickening.  Assess heart valve function.  Tell a health care provider about:  Any allergies you have.  All medicines you are taking, including vitamins, herbs, eye drops, creams, and over-the-counter medicines.  Any problems you or family members have had with anesthetic medicines.  Any blood disorders you have.  Any surgeries you have had.  Any medical conditions you have.  Whether you are pregnant or may be pregnant. What happens before the procedure? No special preparation is needed. Eat and drink normally. What happens during the procedure?  In order to produce an image of your heart, gel will be applied to your chest and a wand-like tool (transducer) will be moved over your chest. The gel will help transmit the sound waves from the transducer. The sound waves will harmlessly bounce off your heart to allow the heart images to be captured in real-time motion. These images will then be recorded.  You may need an IV to receive a medicine that improves the quality of the pictures. What happens after the procedure? You may return to your normal schedule including diet, activities, and medicines, unless your health care provider tells you otherwise. This information is not intended to replace advice given to you by your health care provider. Make sure you discuss any questions you have with your health care provider. Document Released: 02/13/2000 Document Revised: 10/04/2015 Document Reviewed: 10/23/2012 Elsevier Interactive Patient Education  2017 Elsevier  Inc.

## 2018-01-21 LAB — CBC
HEMATOCRIT: 35 % — AB (ref 37.5–51.0)
Hemoglobin: 11.8 g/dL — ABNORMAL LOW (ref 13.0–17.7)
MCH: 30.3 pg (ref 26.6–33.0)
MCHC: 33.7 g/dL (ref 31.5–35.7)
MCV: 90 fL (ref 79–97)
Platelets: 372 10*3/uL (ref 150–450)
RBC: 3.89 x10E6/uL — AB (ref 4.14–5.80)
RDW: 13.7 % (ref 12.3–15.4)
WBC: 6.1 10*3/uL (ref 3.4–10.8)

## 2018-01-21 LAB — BASIC METABOLIC PANEL
BUN/Creatinine Ratio: 11 (ref 9–20)
BUN: 7 mg/dL (ref 6–20)
CO2: 25 mmol/L (ref 20–29)
CREATININE: 0.63 mg/dL — AB (ref 0.76–1.27)
Calcium: 8.3 mg/dL — ABNORMAL LOW (ref 8.7–10.2)
Chloride: 86 mmol/L — ABNORMAL LOW (ref 96–106)
GFR, EST AFRICAN AMERICAN: 145 mL/min/{1.73_m2} (ref >59)
GFR, EST NON AFRICAN AMERICAN: 125 mL/min/{1.73_m2} (ref >59)
Glucose: 156 mg/dL — ABNORMAL HIGH (ref 65–99)
POTASSIUM: 4.8 mmol/L (ref 3.5–5.2)
SODIUM: 124 mmol/L — AB (ref 134–144)

## 2018-01-21 LAB — PRO B NATRIURETIC PEPTIDE: NT-Pro BNP: 8901 pg/mL — ABNORMAL HIGH (ref 0–86)

## 2018-01-23 ENCOUNTER — Telehealth: Payer: Self-pay

## 2018-01-23 DIAGNOSIS — L97509 Non-pressure chronic ulcer of other part of unspecified foot with unspecified severity: Secondary | ICD-10-CM

## 2018-01-23 DIAGNOSIS — I10 Essential (primary) hypertension: Secondary | ICD-10-CM

## 2018-01-23 DIAGNOSIS — R600 Localized edema: Secondary | ICD-10-CM

## 2018-01-23 DIAGNOSIS — E10621 Type 1 diabetes mellitus with foot ulcer: Secondary | ICD-10-CM

## 2018-01-23 NOTE — Telephone Encounter (Signed)
Patient advised that hemoglobin is much improved and that he should not stop his diuretic.  He should begin to fluid restricting 2 L/day and have a repeat BMP performed in 2 weeks. Patient agreed to plan and verbalized understanding.

## 2018-01-23 NOTE — Telephone Encounter (Signed)
Left message for patient to return call regarding lab work.  

## 2018-01-27 ENCOUNTER — Ambulatory Visit (HOSPITAL_BASED_OUTPATIENT_CLINIC_OR_DEPARTMENT_OTHER)
Admission: RE | Admit: 2018-01-27 | Discharge: 2018-01-27 | Disposition: A | Discharge status: Home / Self Care | Payer: Medicaid Other | Source: Ambulatory Visit | Attending: Cardiology | Admitting: Cardiology

## 2018-01-27 DIAGNOSIS — R600 Localized edema: Secondary | ICD-10-CM | POA: Diagnosis not present

## 2018-01-27 DIAGNOSIS — I10 Essential (primary) hypertension: Secondary | ICD-10-CM | POA: Insufficient documentation

## 2018-01-27 DIAGNOSIS — R7989 Other specified abnormal findings of blood chemistry: Secondary | ICD-10-CM | POA: Diagnosis present

## 2018-01-27 NOTE — Progress Notes (Signed)
  Echocardiogram 2D Echocardiogram has been performed.  Jay Barber T Jamera Vanloan 01/27/2018, 2:36 PM

## 2018-01-30 ENCOUNTER — Telehealth: Payer: Self-pay

## 2018-01-30 MED ORDER — CARVEDILOL 6.25 MG PO TABS: 3.1250 mg | ORAL_TABLET | Freq: Two times a day (BID) | ORAL | 3 refills | Status: DC

## 2018-01-30 MED ORDER — SACUBITRIL-VALSARTAN 49-51 MG PO TABS: 1.0000 | ORAL_TABLET | Freq: Two times a day (BID) | ORAL | 3 refills | Status: DC

## 2018-01-30 NOTE — Telephone Encounter (Addendum)
Patient advised of echocardiogram results per Dr Dulce SellarMunley. Patient advised to stop lisinopril x 3 days and start Entresto 49/51mg  one tablet twice daily on the morning of Thursday 02-02-18.  Patient also instructed to start carvedilol 6.25 mg take 0.5 tablet (3.125mg ) twice daily, and he will recheck labs as instructed previously.    Samples of Entresto left at sample closet and carvedilol sent to pharmacy.    Patient agrees to plan and verbalized understanding.

## 2018-01-30 NOTE — Telephone Encounter (Signed)
Left message for patient to return call regarding echocardiogram results  

## 2018-02-07 ENCOUNTER — Telehealth: Payer: Self-pay

## 2018-02-07 ENCOUNTER — Telehealth: Payer: Self-pay | Admitting: Cardiology

## 2018-02-07 NOTE — Telephone Encounter (Signed)
Patients wife French Anaracy called stating that the patient has been having fatigue and dizziness with nausea and vomiting since starting carvedilol and Entresto last Thursday.  Patients wife reports he is not feeling well at all and is very sick.   At the time of the call patient did check his blood pressure which was 136/101.  His blood sugar was 82.  Per Dr Dulce SellarMunley, patients wife was advised that this does not sound like a cardiac issue, and it is hard to determine if one of the medicines could be causing these issues.  Dr Dulce SellarMunley advised patient to stop both carvedilol and Entresto and see PCP to determine the cause of patients symptoms.   Patients wife advised to contact our office after visit with PCP to determine when medications would be restarted by Dr Dulce SellarMunley.  Patients wife agrees to plan and verbalized understanding.

## 2018-02-08 NOTE — Telephone Encounter (Signed)
error 

## 2018-02-09 ENCOUNTER — Telehealth: Payer: Self-pay

## 2018-02-09 NOTE — Telephone Encounter (Signed)
[facility]Ent[facility] 49-51mg  has been approved from 02-06-18 - 02-01-19 PA# [id number]

## 2018-03-07 NOTE — Telephone Encounter (Signed)
Patient states that he is still having edema, but overall is feeling better since stopping carvedilol and Entresto.  Patient states that he has an appointment with his PCP on 03-09-2018.  He states that he will discuss issues with PCP and call our office for sooner follow up if they see that he needs to be seen sooner.   Patient made aware of appointment with Dr Dulce Sellar on 04-13-2018.  Patient agreed to plan and verbalized understanding.

## 2018-03-07 NOTE — Telephone Encounter (Signed)
Patient's wife called again to report patient still having issues. Patient has not seen PCP as of yet. Please advise.

## 2018-03-13 ENCOUNTER — Other Ambulatory Visit: Payer: Self-pay

## 2018-03-13 DIAGNOSIS — I739 Peripheral vascular disease, unspecified: Secondary | ICD-10-CM

## 2018-03-22 ENCOUNTER — Encounter (HOSPITAL_COMMUNITY): Payer: Medicaid Other

## 2018-03-22 ENCOUNTER — Ambulatory Visit: Payer: Medicaid Other | Admitting: Vascular Surgery

## 2018-04-13 ENCOUNTER — Ambulatory Visit (INDEPENDENT_AMBULATORY_CARE_PROVIDER_SITE_OTHER): Payer: Medicare Other | Admitting: Cardiology

## 2018-04-13 ENCOUNTER — Encounter: Payer: Self-pay | Admitting: Cardiology

## 2018-04-13 VITALS — BP 114/70 | HR 82 | Ht 71.0 in | Wt 136.8 lb

## 2018-04-13 DIAGNOSIS — I5022 Chronic systolic (congestive) heart failure: Secondary | ICD-10-CM | POA: Insufficient documentation

## 2018-04-13 DIAGNOSIS — E871 Hypo-osmolality and hyponatremia: Secondary | ICD-10-CM

## 2018-04-13 MED ORDER — FUROSEMIDE 20 MG PO TABS: ORAL_TABLET | ORAL | 6 refills | Status: AC

## 2018-04-13 NOTE — Progress Notes (Signed)
Cardiology Office Note:    Date:  04/13/2018   ID:  Jay Barber, DOB 02-Jan-1981, MRN [id number]  PCP:  Jay SellarHudnell, Stephanie, NP  Cardiologist:  Jay HerrlichBrian Munley, MD    Referring MD: Jay SellarHudnell, Stephanie, NP    ASSESSMENT:    No diagnosis found. PLAN:    In order of problems listed above:  1. His heart failure is improved presently New York Heart Association class I with his symptomatic hypotension and hyponatremia we will rethink her diuretic and take it as needed for weight gain he was intolerant of Entresto but is tolerating an ACE inhibitor will continue for now plan to reassess his echocardiogram along with proBNP level make a decision about further pharmacologic therapy or consideration of ICD at his next office visit. 2. Stable continue his ACE inhibitor 3. Await labs with his PCP especially renal function proBNP of his proBNP is still severely elevated we will attempt to put him on additional therapy beta-blocker MRA   Next appointment: 3 months   Medication Adjustments/Labs and Tests Ordered: Current medicines are reviewed at length with the patient today.  Concerns regarding medicines are outlined above.  No orders of the defined types were placed in this encounter.  No orders of the defined types were placed in this encounter.   Chief Complaint  Patient presents with  . Follow-up  . Congestive Heart Failure    History of Present Illness:    Jay BlazerJason B Barber is a 38 y.o. male with a hx of Type 1 diabetes, hypertension, PAD, anemia, alcohol abuse neuropathy and  R femoral popliteal bypass June 2019  and  edema withn elevated BNP  last seen 03/22/18 with heart failure. Compliance with diet, lifestyle and medications: Yes He is markedly improved and totally is lost over 30 pounds and actually sounds like he was over diuresed symptomatic hypotensive Entresto stopped diuretic stopped and he has arrangements for follow-up labs next week asked him to have a proBNP level also  performed.  His strength and endurance is back to normal he has had no exercise intolerance edema shortness of breath orthopnea chest pain or palpitation and is on a stable dose of lisinopril.  We decided to start to take a diuretic as needed based upon weight gain continue current treatment with his previous hypotension reassess an echocardiogram in 3 months and if EF remains severely reduced consider other endeavors such as a retrial of Entresto and beta-blocker. Past Medical History:  Diagnosis Date  . Anemia   . Depression   . Diabetes mellitus without complication (HCC)    takes Lantus and Novolog Type I  . Drainage from wound    right big toe  . Family history of adverse reaction to anesthesia    mother had PONV- 09/27/17 - "years ago"  . Full dentures   . GERD (gastroesophageal reflux disease)   . History of bronchitis    only had once in his life-3+yrs ago  . Hypertension   . Neuropathy associated with endocrine disorder (HCC)   . Nocturia   . Panic attacks    hx of but no meds required  . Peripheral vascular disease (HCC)   . Wears contact lenses     Past Surgical History:  Procedure Laterality Date  . ABDOMINAL AORTAGRAM N/A 11/06/2012   Procedure: ABDOMINAL Ronny FlurryAORTAGRAM;  Surgeon: Chuck Hinthristopher S Dickson, MD;  [facility];  Service: Cardiovascular;  Laterality: N/A;  . ABDOMINAL AORTOGRAM N/A 06/03/2017   Procedure: ABDOMINAL AORTOGRAM;  Surgeon: Edilia Boickson,  Di Kindlehristopher S, MD;  [facility]: [facility] INVASIVE [facility];  Service: Cardiovascular;  Laterality: N/A;  . ABDOMINAL AORTOGRAM W/LOWER EXTREMITY N/A 12/02/2017   Procedure: ABDOMINAL AORTOGRAM W/LOWER EXTREMITY;  Surgeon: Chuck Hintickson, Christopher S, MD;  [facility];  Service: Cardiovascular;  Laterality: N/A;  . AMPUTATION Right 05/11/2013   Procedure: AMPUTATION DIGIT;  Surgeon: Nadara MustardMarcus V Duda, MD;  Location: [facility] OR;  Service: Orthopedics;  Laterality: Right;  Right Great Toe Amputation at MTP  . AMPUTATION Right  09/28/2017   Procedure: AMPUTATION RAY 5TH TOE POSSIBLE 4TH TOE;  Surgeon: Larina EarthlyEarly, Todd F, MD;  [facility];  Service: Vascular;  Laterality: Right;  . AMPUTATION Right 12/13/2017   Procedure: AMPUTATION BELOW KNEE;  Surgeon: Chuck Hintickson, Christopher S, MD;  [facility];  Service: Vascular;  Laterality: Right;  . APPLICATION OF WOUND VAC Right 09/28/2017   Procedure: APPLICATION OF WOUND VAC - RIGHT FOOT;  Surgeon: Larina EarthlyEarly, Todd F, MD;  [facility]: [facility] OR;  Service: Vascular;  Laterality: Right;  . BELOW KNEE LEG AMPUTATION Right 12/13/2017  . ESOPHAGOGASTRODUODENOSCOPY    . FEMORAL ARTERY - POPLITEAL ARTERY BYPASS GRAFT  08/09/2017  . FEMORAL-POPLITEAL BYPASS GRAFT Right 08/09/2017   Procedure: BYPASS GRAFT FEMORAL-BELOW KNEE POPLITEAL ARTERY USING 6MM X 80CM GORE PROPATEN VASCULAR GRAFT;  Surgeon: Chuck Hintickson, Christopher S, MD;  [facility];  Service: Vascular;  Laterality: Right;  . INTRAOPERATIVE ARTERIOGRAM Right 08/09/2017   Procedure: INTRA OPERATIVE ARTERIOGRAM RIGHT LOWER LEG;  Surgeon: Chuck Hintickson, Christopher S, MD;  [facility];  Service: Vascular;  Laterality: Right;  . LOWER EXTREMITY ANGIOGRAPHY Bilateral 06/03/2017   Procedure: Lower Extremity Angiography;  Surgeon: Chuck Hintickson, Christopher S, MD;  [facility];  Service: Cardiovascular;  Laterality: Bilateral;  . MOUTH SURGERY    . MULTIPLE TOOTH EXTRACTIONS      Current Medications: Current Meds  Medication Sig  . ALPRAZolam (XANAX) 1 MG tablet Take 1 mg by mouth 4 (four) times daily as needed for anxiety.   Marland Kitchen. aspirin 81 MG tablet Take 81 mg by mouth every evening.   . collagenase (SANTYL) ointment Apply 1 application topically daily.  Marland Kitchen. ibuprofen (ADVIL,MOTRIN) 200 MG tablet Take 400 mg by mouth every 6 (six) hours as needed.  Marland Kitchen. imipramine (TOFRANIL) 25 MG tablet Take 50 mg by mouth at bedtime.   . insulin aspart (NOVOLOG) 100 UNIT/ML injection Inject 2-4 Units into the skin 3 (three) times daily with meals. Per  sliding scale  . insulin glargine (LANTUS) 100 UNIT/ML injection Inject 12 Units into the skin at bedtime.   Marland Kitchen. lisinopril (PRINIVIL,ZESTRIL) 20 MG tablet Take 20 mg by mouth daily.  . Melatonin 3 MG CAPS Take 6 mg by mouth at bedtime as needed (sleep).  . mirtazapine (REMERON) 15 MG tablet TAKE 1 TABLET BY MOUTH EVERYDAY AT BEDTIME  . Multiple Vitamins-Minerals (ALIVE MENS ENERGY PO) Take 1 tablet by mouth daily.  Marland Kitchen. omeprazole (PRILOSEC OTC) 20 MG tablet Take 20 mg by mouth daily as needed (for acid reflux).  . simvastatin (ZOCOR) 20 MG tablet Take 1 tablet (20 mg total) by mouth daily at 6 PM.  . SPIRIVA RESPIMAT 1.25 MCG/ACT AERS TAKE 2 PUFFS BY MOUTH EVERY DAY     Allergies:   Bupropion   Social History   Socioeconomic History  . Marital status: Single    Spouse name: Not on file  . Number of children: Not on file  . Years of education: Not on file  . Highest  education level: Not on file  Occupational History  . Occupation: disable   Social Needs  . Financial resource strain: Not on file  . Food insecurity:    Worry: Not on file    Inability: Not on file  . Transportation needs:    Medical: Not on file    Non-medical: Not on file  Tobacco Use  . Smoking status: Current Every Day Smoker    Packs/day: 1.00    Years: 14.00    Pack years: 14.00    Types: Cigarettes  . Smokeless tobacco: Never Used  . Tobacco comment: Less than 1 pk  Substance and Sexual Activity  . Alcohol use: Yes    Comment: occaionally  . Drug use: No  . Sexual activity: Yes  Lifestyle  . Physical activity:    Days per week: Not on file    Minutes per session: Not on file  . Stress: Not on file  Relationships  . Social connections:    Talks on phone: Not on file    Gets together: Not on file    Attends religious service: Not on file    Active member of club or organization: Not on file    Attends meetings of clubs or organizations: Not on file    Relationship status: Not on file  Other  Topics Concern  . Not on file  Social History Narrative   Resides with spouse and children      Family History: [name] family history includes Depression in his mother; GER disease in his mother; Heart disease in his father; Hypertension in his mother. ROS:   Please see the history of present illness.    All other systems reviewed and are negative.  EKGs/Labs/Other Studies Reviewed:    The following studies were reviewed today:    Recent Labs: 12/29/2017: ALT 9; B Natriuretic Peptide 1,694.0 01/20/2018: BUN 7; Creatinine, Ser 0.63; Hemoglobin 11.8; NT-Pro BNP 8,901; Platelets 372; Potassium 4.8; Sodium 124  Recent Lipid Panel    Component Value Date/Time   CHOL 226 (H) 04/15/2009 1439   TRIG 46.0 04/15/2009 1439   HDL 136.40 04/15/2009 1439   CHOLHDL 2 04/15/2009 1439   VLDL 9.2 04/15/2009 1439   LDLDIRECT 77.5 04/15/2009 1439    Physical Exam:    VS:  BP 114/70 (BP Location: Left Arm, Patient Position: Sitting, Cuff Size: Normal)   Pulse 82   Ht 5\' 11"  (1.803 m)   Wt 136 lb 12.8 oz (62.1 kg)   SpO2 96%   BMI 19.08 kg/m     Wt Readings from Last 3 Encounters:  04/13/18 136 lb 12.8 oz (62.1 kg)  01/20/18 147 lb 1.9 oz (66.7 kg)  01/11/18 167 lb (75.8 kg)     GEN: He now looks healthy is no longer gray and vasoconstricted well nourished, well developed in no acute distress HEENT: Normal NECK: No JVD; No carotid bruits LYMPHATICS: No lymphadenopathy CARDIAC: No gallop RRR, no murmurs, rubs, gallops RESPIRATORY:  Clear to auscultation without rales, wheezing or rhonchi  ABDOMEN: Soft, non-tender, non-distended MUSCULOSKELETAL: Trace pretibial edema; No deformity  SKIN: Warm and dry NEUROLOGIC:  Alert and oriented x 3 PSYCHIATRIC:  Normal affect    Signed, Jay Herrlich, MD  04/13/2018 4:01 PM    [facility]

## 2018-04-13 NOTE — Patient Instructions (Addendum)
Medication Instructions:  Your physician has recommended you make the following change in your medication:   START furosemide (lasix) 20 mg: Take 1 tablet daily as needed if your weight is greater than 141 pounds  If you need a refill on your cardiac medications before your next appointment, please call your pharmacy.   Lab work: Your physician recommends that you return for lab work in 1-2 weeks: ProBNP, BMP at your PCP office.   If you have labs (blood work) drawn today and your tests are completely normal, you will receive your results only by: Marland Kitchen MyChart Message (if you have MyChart) OR . A paper copy in the mail If you have any lab test that is abnormal or we need to change your treatment, we will call you to review the results.  Testing/Procedures: Your physician has requested that you have an echocardiogram. Echocardiography is a painless test that uses sound waves to create images of your heart. It provides your doctor with information about the size and shape of your heart and how well your heart's chambers and valves are working. This procedure takes approximately one hour. There are no restrictions for this procedure. Please schedule for May 2020.   Follow-Up: At [facility], you and your health needs are our priority.  As part of our continuing mission to provide you with exceptional heart care, we have created designated Provider Care Teams.  These Care Teams include your primary Cardiologist (physician) and Advanced Practice Providers (APPs -  Physician Assistants and Nurse Practitioners) who all work together to provide you with the care you need, when you need it. . You will need a follow up appointment in 3 months.   Any Other Special Instructions Will Be Listed Below (If Applicable). **Please drink 2-3 liters of water daily!      Echocardiogram An echocardiogram is a procedure that uses painless sound waves (ultrasound) to produce an image of the heart. Images from an  echocardiogram can provide important information about:  Signs of coronary artery disease (CAD).  Aneurysm detection. An aneurysm is a weak or damaged part of an artery wall that bulges out from the normal force of blood pumping through the body.  Heart size and shape. Changes in the size or shape of the heart can be associated with certain conditions, including heart failure, aneurysm, and CAD.  Heart muscle function.  Heart valve function.  Signs of a past heart attack.  Fluid buildup around the heart.  Thickening of the heart muscle.  A tumor or infectious growth around the heart valves. Tell a health care provider about:  Any allergies you have.  All medicines you are taking, including vitamins, herbs, eye drops, creams, and over-the-counter medicines.  Any blood disorders you have.  Any surgeries you have had.  Any medical conditions you have.  Whether you are pregnant or may be pregnant. What are the risks? Generally, this is a safe procedure. However, problems may occur, including:  Allergic reaction to dye (contrast) that may be used during the procedure. What happens before the procedure? No specific preparation is needed. You may eat and drink normally. What happens during the procedure?   An IV tube may be inserted into one of your veins.  You may receive contrast through this tube. A contrast is an injection that improves the quality of the pictures from your heart.  A gel will be applied to your chest.  A wand-like tool (transducer) will be moved over your chest. The gel  will help to transmit the sound waves from the transducer.  The sound waves will harmlessly bounce off of your heart to allow the heart images to be captured in real-time motion. The images will be recorded on a computer. The procedure may vary among health care providers and hospitals. What happens after the procedure?  You may return to your normal, everyday life, including diet,  activities, and medicines, unless your health care provider tells you not to do that. Summary  An echocardiogram is a procedure that uses painless sound waves (ultrasound) to produce an image of the heart.  Images from an echocardiogram can provide important information about the size and shape of your heart, heart muscle function, heart valve function, and fluid buildup around your heart.  You do not need to do anything to prepare before this procedure. You may eat and drink normally.  After the echocardiogram is completed, you may return to your normal, everyday life, unless your health care provider tells you not to do that. This information is not intended to replace advice given to you by your health care provider. Make sure you discuss any questions you have with your health care provider. Document Released: 02/13/2000 Document Revised: 03/20/2016 Document Reviewed: 03/20/2016 Elsevier Interactive Patient Education  2019 [employer].

## 2018-04-18 ENCOUNTER — Inpatient Hospital Stay
Admission: EM | Admit: 2018-04-18 | Payer: Self-pay | Source: Other Acute Inpatient Hospital | Admitting: Internal Medicine

## 2018-04-18 NOTE — Care Management (Signed)
This is a no charge note  Transfer from [facility] to [place] SDU per Dr. Macie Burows, Male, 38 y.o., 06-Apr-1980  38 year old man with past medical history for type 1 diabetes, hypertension, GERD, depression, anemia, panic attack, PVD, bronchitis, alcohol abuse, tobacco abuse, s/p of right BKA, CHF with EF of 30%, who presents with shortness of breath.  Patient was diagnosed with flu on Friday currently on Tamiflu.  Patient has cough and shortness of breath, which has been progressively getting worse.  Patient has chills, no fever.  No chest pain.  The chest x-ray showed bilateral upper lobe multifocal infiltration.  proBNP 18,000.  Patient was found to have WBC 2.5, creatinine 2.2 (0.63 on 01/20/2018), BUN 34, sodium 115, potassium 4.6, temperature 36.3 C, oxygen saturation 89% on room air, 96% on 2 L nasal cannula oxygen, blood pressure 128/83, heart rate 89, respiration rate of 20.  Patient was given 1 dose of vancomycin and cefepime.  Patient does not have fever or leukocytosis, may just has CHF exacerbation. Given his multiple comorbidities and sodium 115, I feel like pt needs to be on SDU.  I told EDP that although we can accept the patient, yet we do not have SDU currently (actually we do not have any beds except for neuro telemetry bed). Patient may not be able to get here in a timely fashion. I encouraged him to continue looking for a facility that had beds. EDP agreed with this plan.  Lorretta Harp   Please Hotel manager [facility] at [phone] when pt arrives to floor   Lorretta Harp, MD  [facility]   If 7PM-7AM, please contact night-coverage [email or web address] Password [facility] 04/18/2018, 10:17 PM

## 2018-04-26 ENCOUNTER — Encounter (HOSPITAL_COMMUNITY): Payer: Medicaid Other

## 2018-04-26 ENCOUNTER — Ambulatory Visit: Payer: Medicaid Other | Admitting: Vascular Surgery

## 2018-05-01 ENCOUNTER — Telehealth: Payer: Self-pay

## 2018-05-01 NOTE — Telephone Encounter (Signed)
Returned call to pt's wife French Ana who wanted Dr. Edilia Bo to know pt missed his 2/26 appt due to being admitted to [facility] (in [place]) for flu, pneumonia, lung collapse and was/is on vent. (per French Ana). Joyce Gross will inform Dr. Edilia Bo this week.

## 2018-05-31 DEATH — deceased

## 2018-07-13 ENCOUNTER — Other Ambulatory Visit: Payer: Medicare Other

## 2018-07-13 ENCOUNTER — Other Ambulatory Visit (HOSPITAL_BASED_OUTPATIENT_CLINIC_OR_DEPARTMENT_OTHER): Payer: Medicare Other

## 2018-07-25 ENCOUNTER — Telehealth: Payer: Medicaid Other | Admitting: Cardiology

## 2018-07-25 NOTE — Progress Notes (Deleted)
{Choose 1 Note Type (Telehealth Visit or Telephone Visit):[phone]}   Date:  07/25/2018   ID:  Jay Barber, DOB 18-Nov-1980, MRN [id number]  {Patient Location:[phone]::"Home"} {Provider Location:[phone]::"Home"}  PCP:  Dulce Sellar, NP  Cardiologist:  Norman Herrlich, MD *** Electrophysiologist:  None   Evaluation Performed:  {Choose Visit Type:[phone]::"Follow-Up Visit"}  Chief Complaint:  ***  History of Present Illness:    Jay Barber is a 38 y.o. male with a history of heart failure hypertension peripheral arterial disease type 1 diabetes anemia alcohol abuse neuropathy and a right femoral-popliteal bypass June 2019 last seen 04/13/2018.  Echocardiogram performed 01/27/2018 showed an ejection fraction of 30 to 35% with mild mitral regurgitation moderate right ventricular dysfunction and mild left atrial enlargement.  A subsequent echocardiogram from March has been delayed with COVID-19.  The patient {does/does not:200015} have symptoms concerning for COVID-19 infection (fever, chills, cough, or new shortness of breath).    Past Medical History:  Diagnosis Date  . Anemia   . Depression   . Diabetes mellitus without complication (HCC)    takes Lantus and Novolog Type I  . Drainage from wound    right big toe  . Family history of adverse reaction to anesthesia    mother had PONV- 09/27/17 - "years ago"  . Full dentures   . GERD (gastroesophageal reflux disease)   . History of bronchitis    only had once in his life-3+yrs ago  . Hypertension   . Neuropathy associated with endocrine disorder (HCC)   . Nocturia   . Panic attacks    hx of but no meds required  . Peripheral vascular disease (HCC)   . Wears contact lenses    Past Surgical History:  Procedure Laterality Date  . ABDOMINAL AORTAGRAM N/A 11/06/2012   Procedure: ABDOMINAL Ronny Flurry;  Surgeon: Chuck Hint, MD;  Location: [facility] CATH LAB;  Service: Cardiovascular;  Laterality: N/A;  .  ABDOMINAL AORTOGRAM N/A 06/03/2017   Procedure: ABDOMINAL AORTOGRAM;  Surgeon: Chuck Hint, MD;  Location: [facility] INVASIVE CV LAB;  Service: Cardiovascular;  Laterality: N/A;  . ABDOMINAL AORTOGRAM W/LOWER EXTREMITY N/A 12/02/2017   Procedure: ABDOMINAL AORTOGRAM W/LOWER EXTREMITY;  Surgeon: Chuck Hint, MD;  Location[facility] INVASIVE CV LAB;  Service: Cardiovascular;  Laterality: N/A;  . AMPUTATION Right 05/11/2013   Procedure: AMPUTATION DIGIT;  Surgeon: Nadara Mustard, MD;  Location: [facility] OR;  Service: Orthopedics;  Laterality: Right;  Right Great Toe Amputation at MTP  . AMPUTATION Right 09/28/2017   Procedure: AMPUTATION RAY 5TH TOE POSSIBLE 4TH TOE;  Surgeon: Larina Earthly, MD;  Location: [facility] OR;  Service: Vascular;  Laterality: Right;  . AMPUTATION Right 12/13/2017   Procedure: AMPUTATION BELOW KNEE;  Surgeon: Chuck Hint, MD;  Location: [facility] OR;  Service: Vascular;  Laterality: Right;  . APPLICATION OF WOUND VAC Right 09/28/2017   Procedure: APPLICATION OF WOUND VAC - RIGHT FOOT;  Surgeon: Larina Earthly, MD;  Location: [facility] OR;  Service: Vascular;  Laterality: Right;  . BELOW KNEE LEG AMPUTATION Right 12/13/2017  . ESOPHAGOGASTRODUODENOSCOPY    . FEMORAL ARTERY - POPLITEAL ARTERY BYPASS GRAFT  08/09/2017  . FEMORAL-POPLITEAL BYPASS GRAFT Right 08/09/2017   Procedure: BYPASS GRAFT FEMORAL-BELOW KNEE POPLITEAL ARTERY USING X 80CM GORE PROPATEN VASCULAR GRAFT;  Surgeon: Chuck Hint, MD;  Location: [facility] OR;  Service: Vascular;  Laterality: Right;  . INTRAOPERATIVE ARTERIOGRAM Right 08/09/2017   Procedure: INTRA OPERATIVE ARTERIOGRAM RIGHT LOWER LEG;  Surgeon: Chuck Hint,  MD;  Location: [facility] OR;  Service: Vascular;  Laterality: Right;  . LOWER EXTREMITY ANGIOGRAPHY Bilateral 06/03/2017   Procedure: Lower Extremity Angiography;  Surgeon: Chuck Hintickson, Christopher S, MD;  [facility];  Service: Cardiovascular;  Laterality: Bilateral;  . MOUTH SURGERY     . MULTIPLE TOOTH EXTRACTIONS       No outpatient medications have been marked as taking for the 07/25/18 encounter (Appointment) with Baldo DaubMunley, Brian J, MD.     Allergies:   Bupropion   Social History   Tobacco Use  . Smoking status: Current Every Day Smoker    Packs/day: 1.00    Years: 14.00    Pack years: 14.00    Types: Cigarettes  . Smokeless tobacco: Never Used  . Tobacco comment: Less than 1 pk  Substance Use Topics  . Alcohol use: Yes    Comment: occaionally  . Drug use: No     Family Hx: [name] family history includes Depression in his mother; GER disease in his mother; Heart disease in his father; Hypertension in his mother.  ROS:   Please see the history of present illness.    *** All other systems reviewed and are negative.   Prior CV studies:   The following studies were reviewed today:  ***  Labs/Other Tests and Data Reviewed:    EKG:  {EKG/Telemetry Strips Reviewed:[phone]}  Recent Labs: 12/29/2017: ALT 9; B Natriuretic Peptide 1,694.0 01/20/2018: BUN 7; Creatinine, Ser 0.63; Hemoglobin 11.8; NT-Pro BNP 8,901; Platelets 372; Potassium 4.8; Sodium 124   Recent Lipid Panel Lab Results  Component Value Date/Time   CHOL 226 (H) 04/15/2009 02:39 PM   TRIG 46.0 04/15/2009 02:39 PM   HDL 136.40 04/15/2009 02:39 PM   CHOLHDL 2 04/15/2009 02:39 PM   LDLDIRECT 77.5 04/15/2009 02:39 PM    Wt Readings from Last 3 Encounters:  04/13/18 136 lb 12.8 oz (62.1 kg)  01/20/18 147 lb 1.9 oz (66.7 kg)  01/11/18 167 lb (75.8 kg)     Objective:    Vital Signs:  There were no vitals taken for this visit.   {[facility] Virtual Exam (Optional):[phone]::"VITAL SIGNS:  reviewed"}  ASSESSMENT & PLAN:    1. ***  COVID-19 Education: The signs and symptoms of COVID-19 were discussed with the patient and how to seek care for testing (follow up with PCP or arrange E-visit).  ***The importance of social distancing was discussed today.  Time:   Today,  I have spent *** minutes with the patient with telehealth technology discussing the above problems.     Medication Adjustments/Labs and Tests Ordered: Current medicines are reviewed at length with the patient today.  Concerns regarding medicines are outlined above.   Tests Ordered: No orders of the defined types were placed in this encounter.   Medication Changes: No orders of the defined types were placed in this encounter.   Disposition:  Follow up {follow up:15908}  Signed, Norman HerrlichBrian Munley, MD  07/25/2018 7:57 AM    [facility]

## 2019-03-29 IMAGING — CR DG ANG/EXT/UNI/OR RIGHT
1 series · 1 of 1 positions shown · IV contrast (agent unspecified)
Comparison: None.

CLINICAL DATA: 37-year-old male undergoing femoral to below the
knee popliteal artery bypass grafting.

EXAM:
RIGHT TIGER/EXT/UNI/ OR
CONTRAST:  Please see operative report
FLUOROSCOPY TIME:  Fluoroscopy Time:  Please see operative report

[AP]
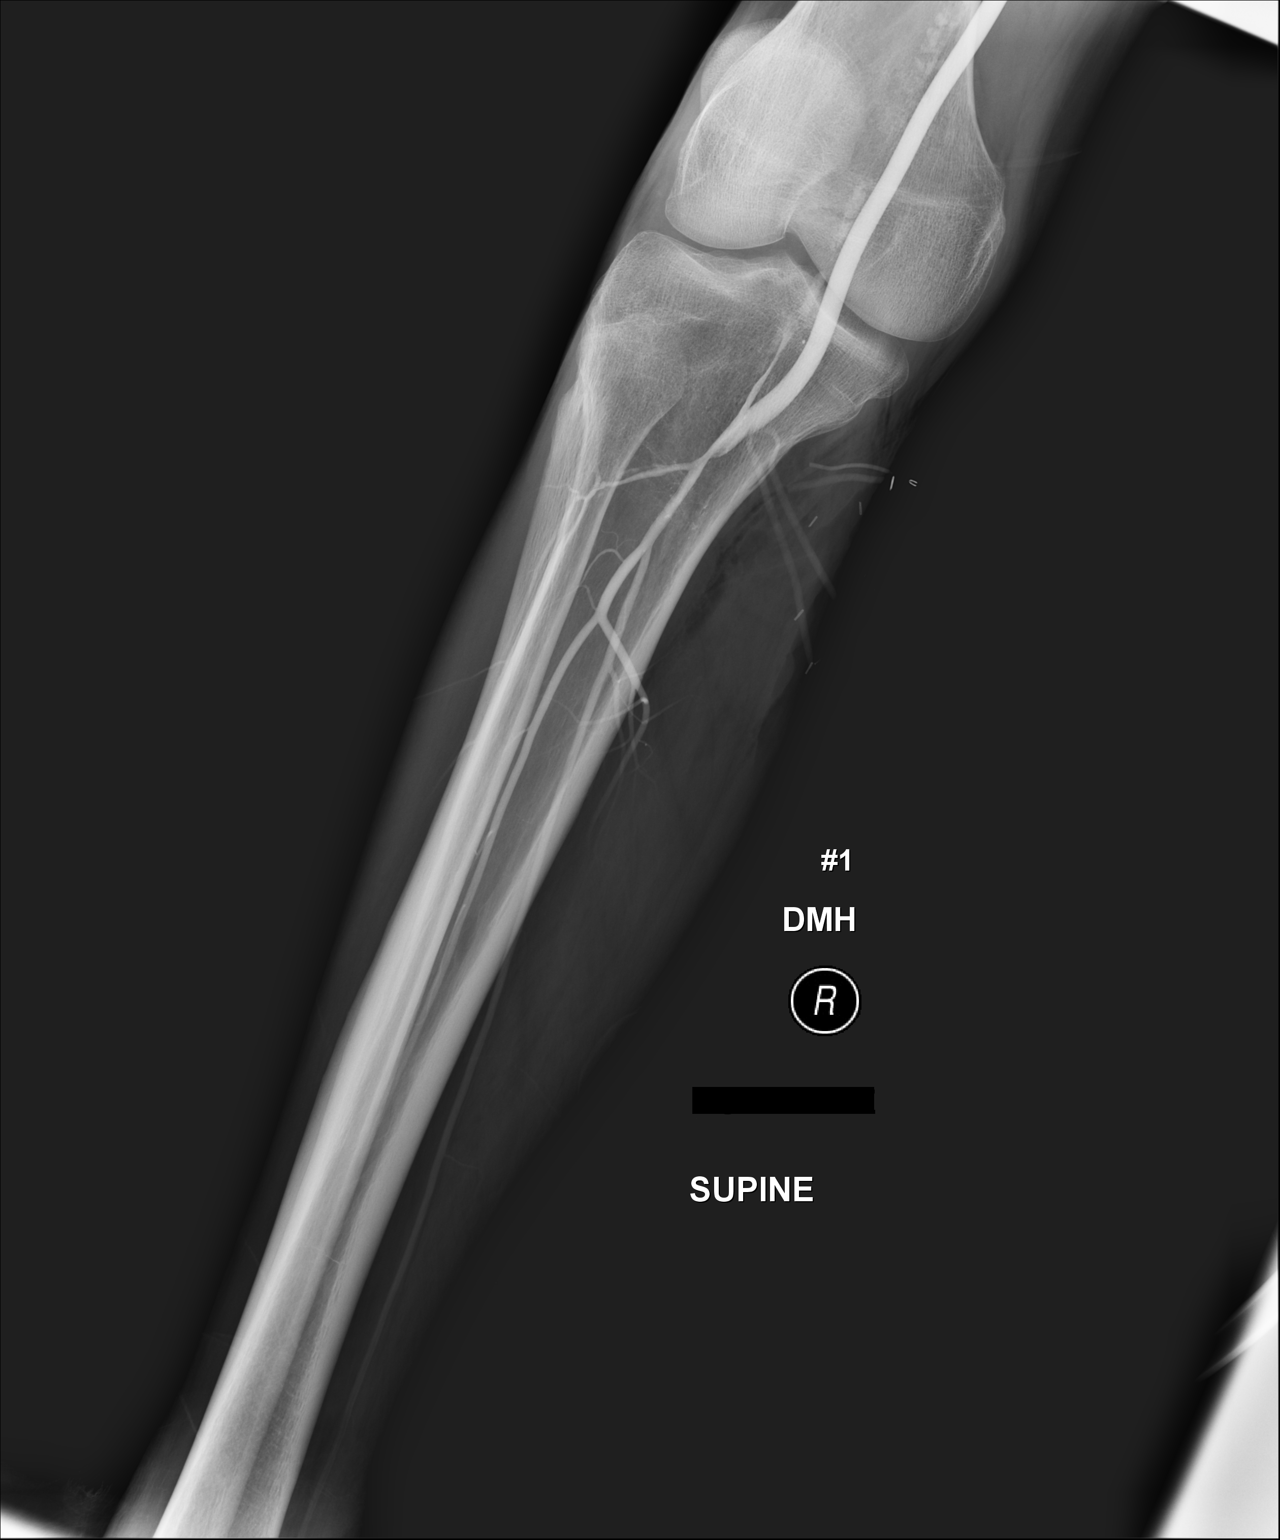

[1 of 1 positions shown; findings below may reference images not displayed]

FINDINGS: A single saved image from an intraoperative arteriogram demonstrates
a widely patent bypass graft anastomosis sing with the below the
knee popliteal artery. The anterior tibial artery is small and
diffusely diseased. The tibioperoneal trunk, peroneal and posterior
tibial arteries are widely patent.
IMPRESSION: Intraoperative arteriogram as above.

## 2019-08-18 IMAGING — CR DG CHEST 2V
2 series · 2 of 2 positions shown · non-contrast
Comparison: December 10, 2015

CLINICAL DATA: Shortness of breath

EXAM:
CHEST - 2 VIEW

[chest pa]
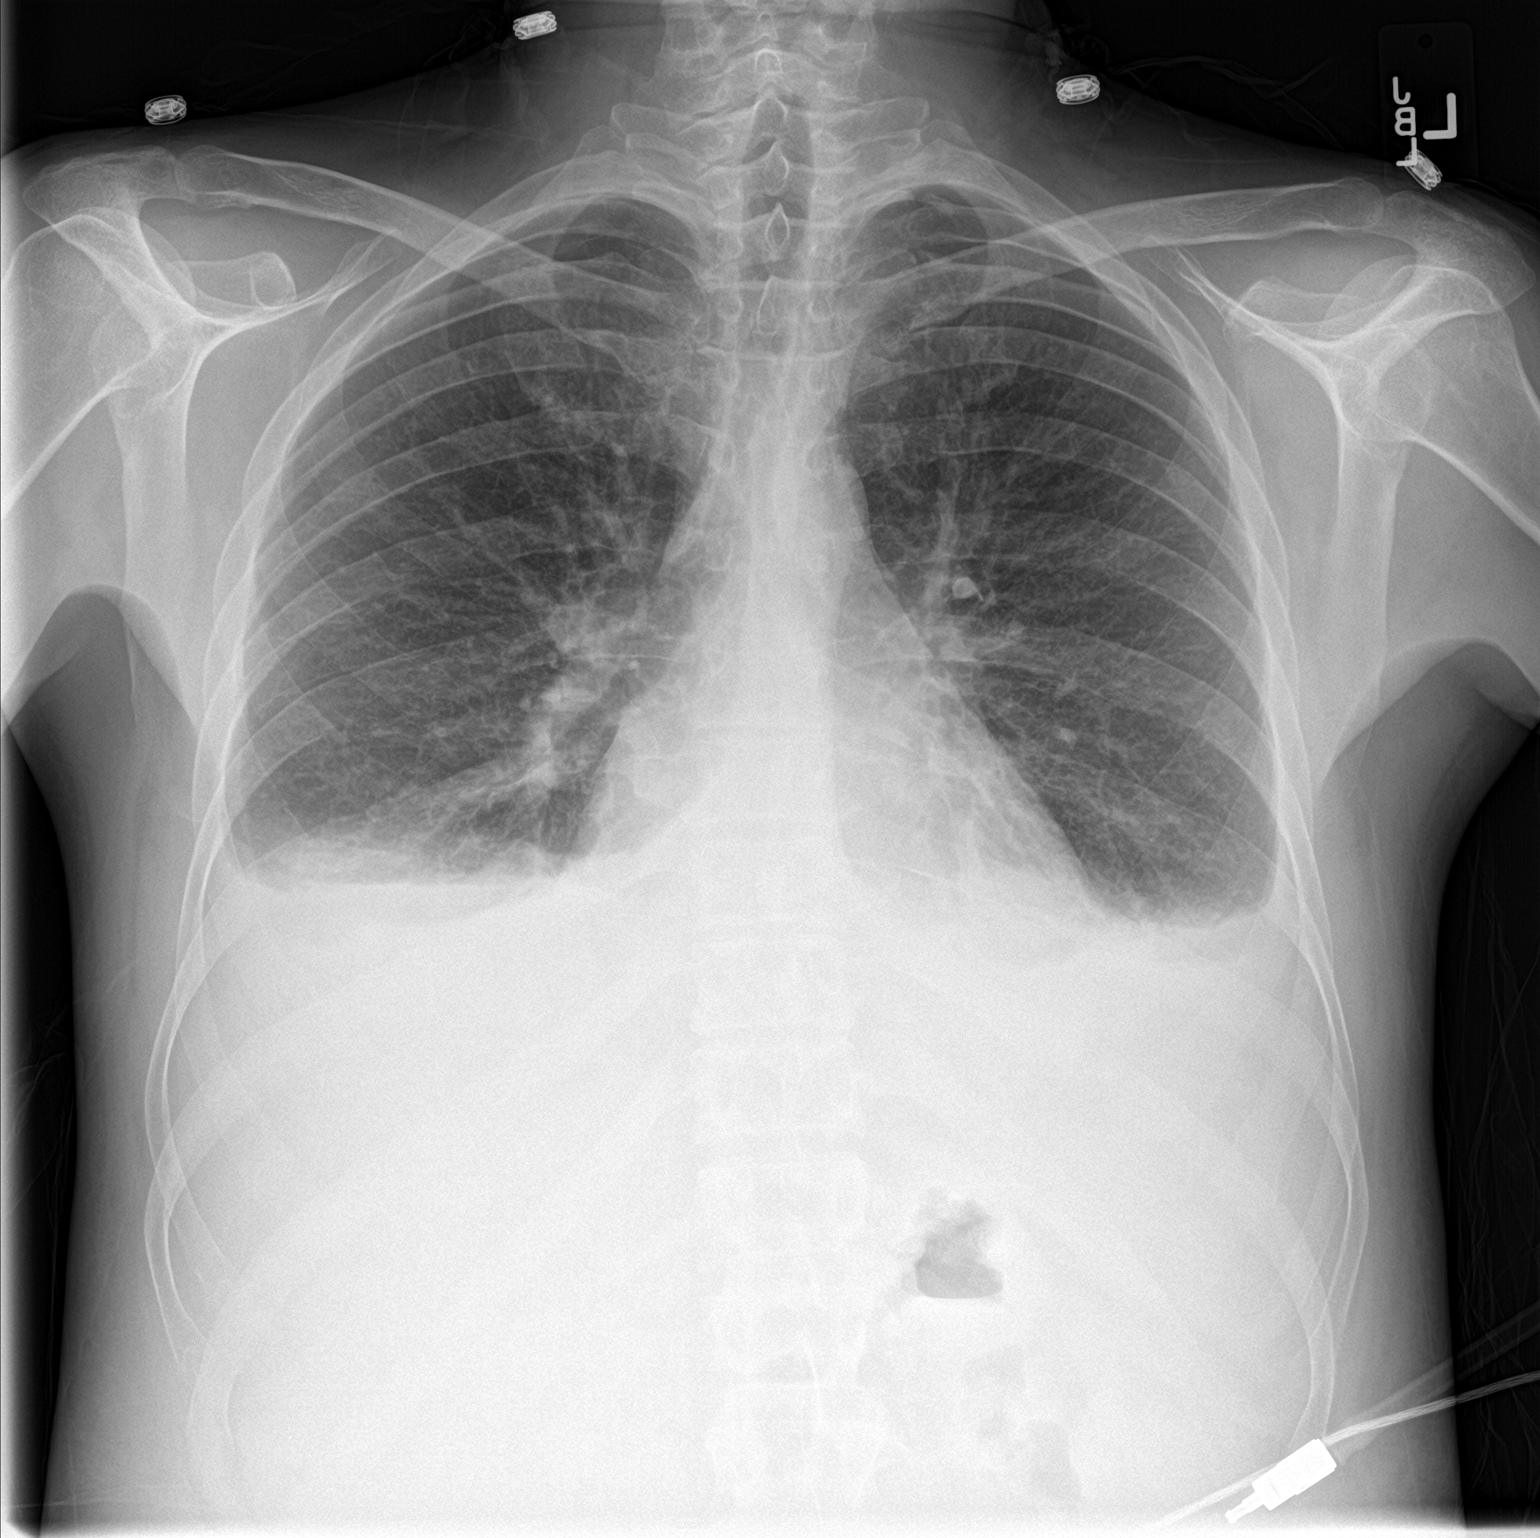

[chest lat]
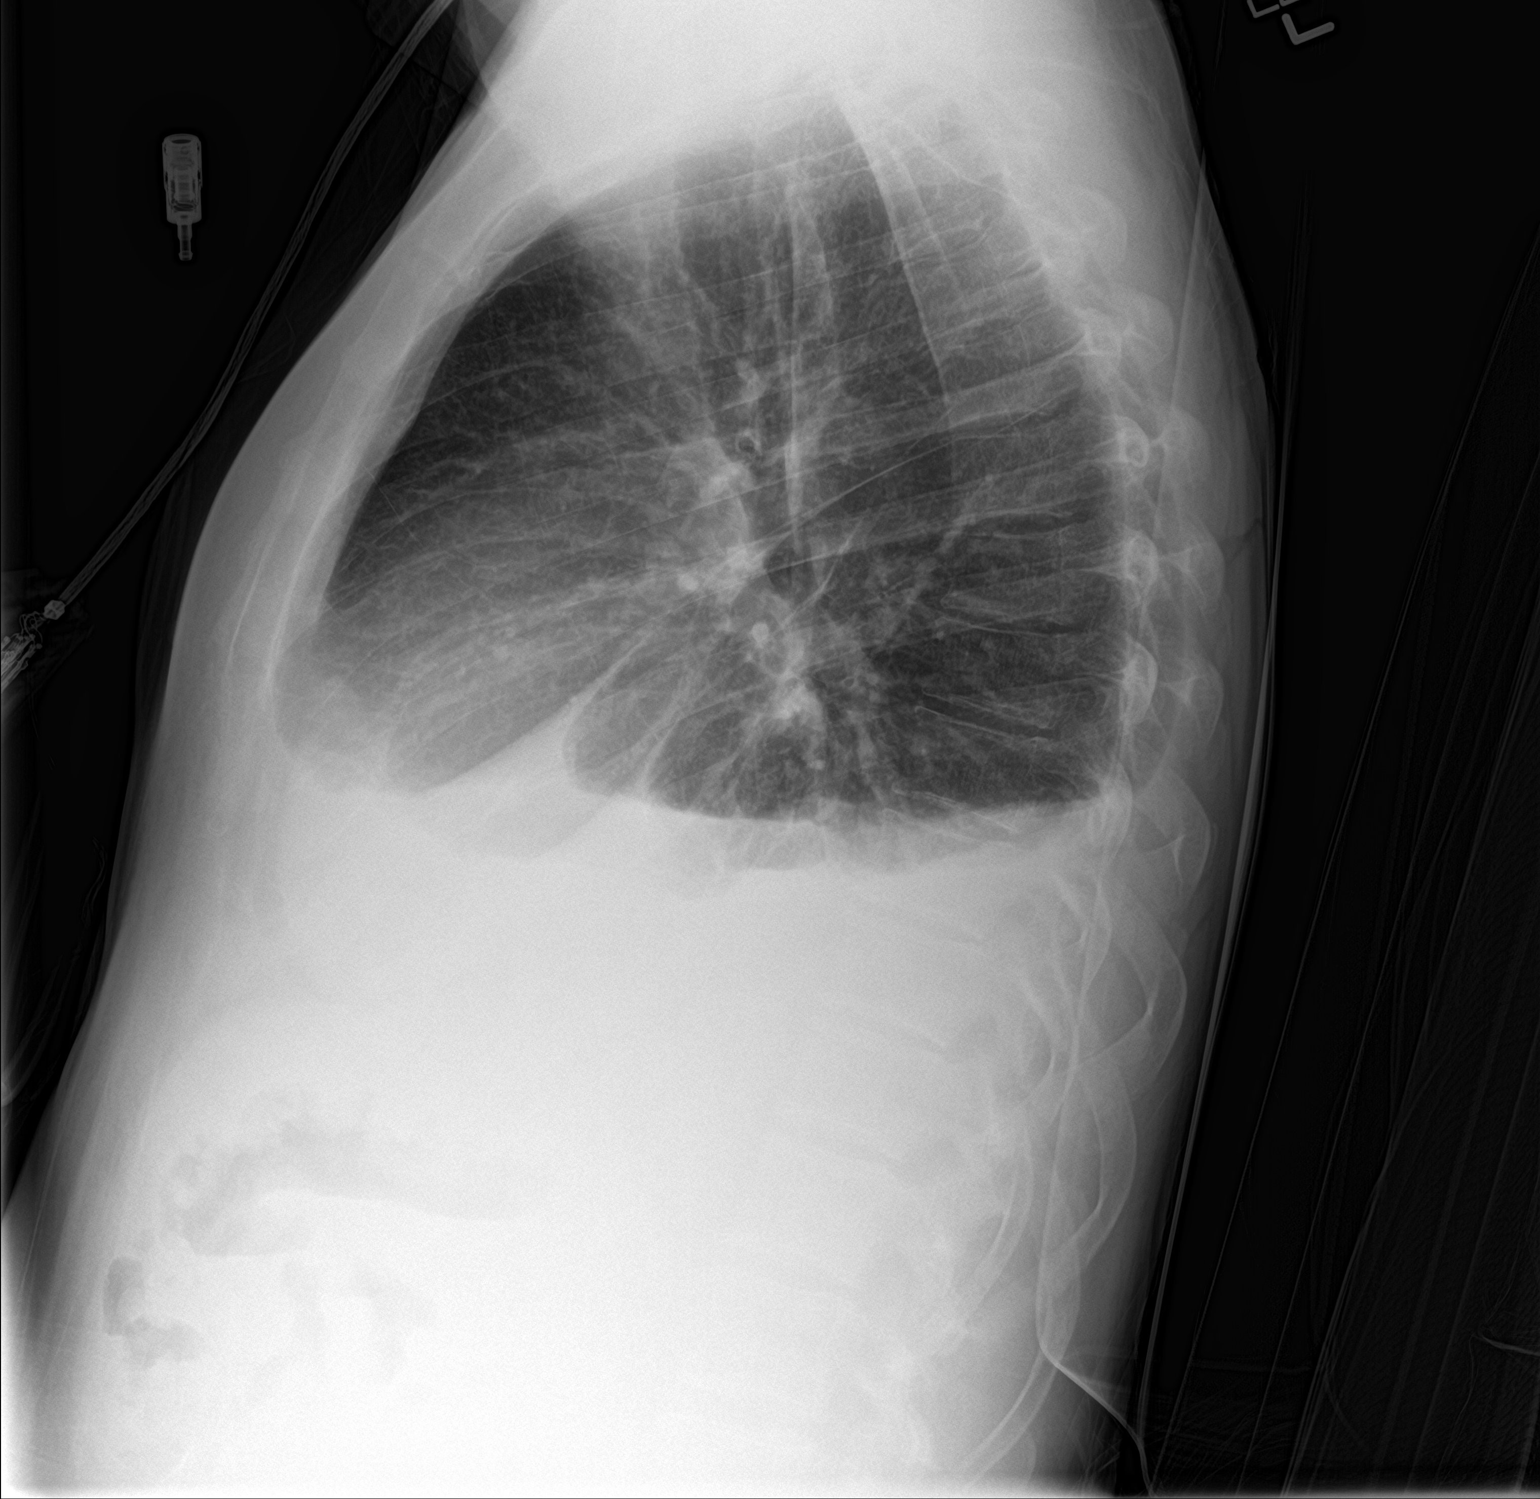

[2 of 2 positions shown; findings below may reference images not displayed]

FINDINGS: There are fairly small pleural effusions bilaterally with bibasilar
atelectasis. The lungs elsewhere are clear. Heart size and pulmonary
vascularity are normal. No adenopathy. No bone lesions.
IMPRESSION: Pleural effusions bilaterally with bibasilar atelectasis. Lungs
elsewhere clear. Heart size. No evident adenopathy.
